# Patient Record
Sex: Female | Born: 1945 | Race: Black or African American | Hispanic: No | Smoking: Current every day smoker
Health system: Southern US, Community
[De-identification: ages and names within clinical notes are randomized; demographics above are authoritative.]

## PROBLEM LIST (undated history)

## (undated) DIAGNOSIS — R42 Dizziness and giddiness: Secondary | ICD-10-CM

## (undated) DIAGNOSIS — E119 Type 2 diabetes mellitus without complications: Secondary | ICD-10-CM

## (undated) DIAGNOSIS — K219 Gastro-esophageal reflux disease without esophagitis: Secondary | ICD-10-CM

## (undated) DIAGNOSIS — I10 Essential (primary) hypertension: Secondary | ICD-10-CM

## (undated) DIAGNOSIS — K449 Diaphragmatic hernia without obstruction or gangrene: Secondary | ICD-10-CM

## (undated) DIAGNOSIS — J449 Chronic obstructive pulmonary disease, unspecified: Secondary | ICD-10-CM

## (undated) DIAGNOSIS — E7800 Pure hypercholesterolemia, unspecified: Secondary | ICD-10-CM

## (undated) HISTORY — PX: CERVICAL SPINE SURGERY: SHX589

## (undated) HISTORY — PX: HAND SURGERY: SHX662

## (undated) HISTORY — PX: TUBAL LIGATION: SHX77

## (undated) HISTORY — DX: Diaphragmatic hernia without obstruction or gangrene: K44.9

## (undated) HISTORY — DX: Gastro-esophageal reflux disease without esophagitis: K21.9

## (undated) HISTORY — PX: BLADDER SURGERY: SHX569

## (undated) HISTORY — PX: FOOT SURGERY: SHX648

## (undated) HISTORY — PX: CHOLECYSTECTOMY, LAPAROSCOPIC: SHX56

## (undated) HISTORY — PX: BACK SURGERY: SHX140

---

## 1999-11-24 ENCOUNTER — Encounter: Payer: Self-pay | Admitting: Neurosurgery

## 1999-11-25 ENCOUNTER — Encounter: Payer: Self-pay | Admitting: Neurosurgery

## 1999-12-02 ENCOUNTER — Encounter: Payer: Self-pay | Admitting: Neurosurgery

## 1999-12-02 ENCOUNTER — Inpatient Hospital Stay (HOSPITAL_COMMUNITY): Admission: RE | Admit: 1999-12-02 | Discharge: 1999-12-04 | Payer: Self-pay | Admitting: Neurosurgery

## 2000-01-31 ENCOUNTER — Encounter: Admission: RE | Admit: 2000-01-31 | Discharge: 2000-01-31 | Payer: Self-pay | Admitting: Neurosurgery

## 2000-01-31 ENCOUNTER — Encounter: Payer: Self-pay | Admitting: Neurosurgery

## 2000-06-06 ENCOUNTER — Encounter: Payer: Self-pay | Admitting: Neurosurgery

## 2000-06-06 ENCOUNTER — Encounter: Admission: RE | Admit: 2000-06-06 | Discharge: 2000-06-06 | Payer: Self-pay | Admitting: Neurosurgery

## 2000-06-14 ENCOUNTER — Encounter: Payer: Self-pay | Admitting: Neurosurgery

## 2000-06-14 ENCOUNTER — Ambulatory Visit (HOSPITAL_COMMUNITY): Admission: RE | Admit: 2000-06-14 | Discharge: 2000-06-14 | Payer: Self-pay | Admitting: Neurosurgery

## 2001-01-08 ENCOUNTER — Encounter: Payer: Self-pay | Admitting: Neurosurgery

## 2001-01-08 ENCOUNTER — Encounter: Admission: RE | Admit: 2001-01-08 | Discharge: 2001-01-08 | Payer: Self-pay | Admitting: Neurosurgery

## 2001-01-15 ENCOUNTER — Other Ambulatory Visit: Admission: RE | Admit: 2001-01-15 | Discharge: 2001-01-15 | Payer: Self-pay | Admitting: *Deleted

## 2001-02-15 ENCOUNTER — Emergency Department (HOSPITAL_COMMUNITY): Admission: EM | Admit: 2001-02-15 | Discharge: 2001-02-15 | Payer: Self-pay | Admitting: Emergency Medicine

## 2001-02-15 ENCOUNTER — Encounter: Payer: Self-pay | Admitting: Emergency Medicine

## 2001-07-09 ENCOUNTER — Ambulatory Visit (HOSPITAL_COMMUNITY): Admission: RE | Admit: 2001-07-09 | Discharge: 2001-07-09 | Payer: Self-pay | Admitting: Pulmonary Disease

## 2001-07-11 ENCOUNTER — Ambulatory Visit (HOSPITAL_COMMUNITY): Admission: RE | Admit: 2001-07-11 | Discharge: 2001-07-11 | Payer: Self-pay | Admitting: Pulmonary Disease

## 2001-12-18 ENCOUNTER — Emergency Department (HOSPITAL_COMMUNITY): Admission: EM | Admit: 2001-12-18 | Discharge: 2001-12-18 | Payer: Self-pay | Admitting: Emergency Medicine

## 2002-12-01 ENCOUNTER — Encounter: Payer: Self-pay | Admitting: *Deleted

## 2002-12-01 ENCOUNTER — Ambulatory Visit (HOSPITAL_COMMUNITY): Admission: RE | Admit: 2002-12-01 | Discharge: 2002-12-01 | Payer: Self-pay | Admitting: *Deleted

## 2002-12-03 ENCOUNTER — Ambulatory Visit (HOSPITAL_COMMUNITY): Admission: RE | Admit: 2002-12-03 | Discharge: 2002-12-03 | Payer: Self-pay | Admitting: Pulmonary Disease

## 2002-12-04 ENCOUNTER — Ambulatory Visit (HOSPITAL_COMMUNITY): Admission: RE | Admit: 2002-12-04 | Discharge: 2002-12-04 | Payer: Self-pay | Admitting: *Deleted

## 2002-12-04 ENCOUNTER — Encounter: Payer: Self-pay | Admitting: *Deleted

## 2002-12-18 ENCOUNTER — Ambulatory Visit (HOSPITAL_COMMUNITY): Admission: RE | Admit: 2002-12-18 | Discharge: 2002-12-18 | Payer: Self-pay | Admitting: General Surgery

## 2003-01-20 ENCOUNTER — Ambulatory Visit (HOSPITAL_COMMUNITY): Admission: RE | Admit: 2003-01-20 | Discharge: 2003-01-20 | Payer: Self-pay | Admitting: Neurosurgery

## 2003-01-20 ENCOUNTER — Encounter: Payer: Self-pay | Admitting: Neurosurgery

## 2003-07-03 ENCOUNTER — Encounter: Payer: Self-pay | Admitting: Neurosurgery

## 2003-07-03 ENCOUNTER — Encounter: Payer: Self-pay | Admitting: Radiology

## 2003-07-03 ENCOUNTER — Encounter: Admission: RE | Admit: 2003-07-03 | Discharge: 2003-07-03 | Payer: Self-pay | Admitting: Neurosurgery

## 2003-07-21 ENCOUNTER — Encounter: Admission: RE | Admit: 2003-07-21 | Discharge: 2003-07-21 | Payer: Self-pay | Admitting: Neurosurgery

## 2003-11-26 ENCOUNTER — Other Ambulatory Visit: Admission: RE | Admit: 2003-11-26 | Discharge: 2003-11-26 | Payer: Self-pay | Admitting: *Deleted

## 2003-12-09 ENCOUNTER — Ambulatory Visit (HOSPITAL_COMMUNITY): Admission: RE | Admit: 2003-12-09 | Discharge: 2003-12-09 | Payer: Self-pay | Admitting: Pulmonary Disease

## 2004-02-24 ENCOUNTER — Emergency Department (HOSPITAL_COMMUNITY): Admission: EM | Admit: 2004-02-24 | Discharge: 2004-02-24 | Payer: Self-pay | Admitting: Emergency Medicine

## 2004-04-18 ENCOUNTER — Ambulatory Visit (HOSPITAL_COMMUNITY): Admission: RE | Admit: 2004-04-18 | Discharge: 2004-04-18 | Payer: Self-pay | Admitting: Pulmonary Disease

## 2004-05-23 ENCOUNTER — Encounter: Admission: RE | Admit: 2004-05-23 | Discharge: 2004-05-23 | Payer: Self-pay | Admitting: Neurosurgery

## 2004-06-10 ENCOUNTER — Encounter: Admission: RE | Admit: 2004-06-10 | Discharge: 2004-06-10 | Payer: Self-pay | Admitting: Neurosurgery

## 2004-12-12 ENCOUNTER — Ambulatory Visit (HOSPITAL_COMMUNITY): Admission: RE | Admit: 2004-12-12 | Discharge: 2004-12-12 | Payer: Self-pay | Admitting: Pulmonary Disease

## 2004-12-19 ENCOUNTER — Ambulatory Visit (HOSPITAL_COMMUNITY): Admission: RE | Admit: 2004-12-19 | Discharge: 2004-12-19 | Payer: Self-pay | Admitting: Pulmonary Disease

## 2004-12-28 ENCOUNTER — Ambulatory Visit (HOSPITAL_COMMUNITY): Admission: RE | Admit: 2004-12-28 | Discharge: 2004-12-28 | Payer: Self-pay | Admitting: Pulmonary Disease

## 2005-01-19 ENCOUNTER — Encounter: Admission: RE | Admit: 2005-01-19 | Discharge: 2005-01-19 | Payer: Self-pay | Admitting: Pulmonary Disease

## 2005-02-03 ENCOUNTER — Encounter: Admission: RE | Admit: 2005-02-03 | Discharge: 2005-02-03 | Payer: Self-pay | Admitting: Pulmonary Disease

## 2006-01-01 ENCOUNTER — Ambulatory Visit (HOSPITAL_COMMUNITY): Admission: RE | Admit: 2006-01-01 | Discharge: 2006-01-01 | Payer: Self-pay | Admitting: Pulmonary Disease

## 2006-05-02 ENCOUNTER — Ambulatory Visit: Payer: Self-pay | Admitting: Internal Medicine

## 2006-05-04 ENCOUNTER — Ambulatory Visit (HOSPITAL_COMMUNITY): Admission: RE | Admit: 2006-05-04 | Discharge: 2006-05-04 | Payer: Self-pay | Admitting: Internal Medicine

## 2006-05-04 ENCOUNTER — Ambulatory Visit: Payer: Self-pay | Admitting: Internal Medicine

## 2007-07-18 ENCOUNTER — Encounter (HOSPITAL_COMMUNITY): Admission: RE | Admit: 2007-07-18 | Discharge: 2007-08-17 | Payer: Self-pay | Admitting: Internal Medicine

## 2007-08-14 ENCOUNTER — Ambulatory Visit: Payer: Self-pay | Admitting: Cardiology

## 2008-02-05 ENCOUNTER — Ambulatory Visit (HOSPITAL_COMMUNITY): Admission: RE | Admit: 2008-02-05 | Discharge: 2008-02-05 | Payer: Self-pay | Admitting: Pulmonary Disease

## 2008-03-02 ENCOUNTER — Ambulatory Visit (HOSPITAL_COMMUNITY): Admission: RE | Admit: 2008-03-02 | Discharge: 2008-03-02 | Payer: Self-pay | Admitting: Pulmonary Disease

## 2008-07-26 ENCOUNTER — Emergency Department (HOSPITAL_COMMUNITY): Admission: EM | Admit: 2008-07-26 | Discharge: 2008-07-26 | Payer: Self-pay | Admitting: Emergency Medicine

## 2008-08-28 ENCOUNTER — Emergency Department (HOSPITAL_COMMUNITY): Admission: EM | Admit: 2008-08-28 | Discharge: 2008-08-28 | Payer: Self-pay | Admitting: Emergency Medicine

## 2008-09-10 ENCOUNTER — Emergency Department (HOSPITAL_COMMUNITY): Admission: EM | Admit: 2008-09-10 | Discharge: 2008-09-10 | Payer: Self-pay | Admitting: Emergency Medicine

## 2008-09-15 ENCOUNTER — Ambulatory Visit (HOSPITAL_COMMUNITY): Admission: RE | Admit: 2008-09-15 | Discharge: 2008-09-15 | Payer: Self-pay | Admitting: Pulmonary Disease

## 2008-10-06 ENCOUNTER — Encounter: Admission: RE | Admit: 2008-10-06 | Discharge: 2008-10-06 | Payer: Self-pay | Admitting: Neurosurgery

## 2008-10-23 ENCOUNTER — Ambulatory Visit (HOSPITAL_COMMUNITY): Admission: RE | Admit: 2008-10-23 | Discharge: 2008-10-24 | Payer: Self-pay | Admitting: Neurosurgery

## 2009-05-20 ENCOUNTER — Ambulatory Visit (HOSPITAL_COMMUNITY): Admission: RE | Admit: 2009-05-20 | Discharge: 2009-05-20 | Payer: Self-pay | Admitting: Pulmonary Disease

## 2009-08-11 ENCOUNTER — Ambulatory Visit (HOSPITAL_COMMUNITY): Admission: RE | Admit: 2009-08-11 | Discharge: 2009-08-11 | Payer: Self-pay | Admitting: Neurosurgery

## 2009-11-12 ENCOUNTER — Inpatient Hospital Stay (HOSPITAL_COMMUNITY): Admission: RE | Admit: 2009-11-12 | Discharge: 2009-11-15 | Payer: Self-pay | Admitting: Neurosurgery

## 2010-02-25 ENCOUNTER — Ambulatory Visit (HOSPITAL_COMMUNITY): Admission: RE | Admit: 2010-02-25 | Discharge: 2010-02-25 | Payer: Self-pay | Admitting: Pulmonary Disease

## 2010-04-04 ENCOUNTER — Other Ambulatory Visit: Admission: RE | Admit: 2010-04-04 | Discharge: 2010-04-04 | Payer: Self-pay | Admitting: Obstetrics & Gynecology

## 2010-04-27 ENCOUNTER — Ambulatory Visit (HOSPITAL_COMMUNITY): Admission: RE | Admit: 2010-04-27 | Discharge: 2010-04-27 | Payer: Self-pay | Admitting: Pulmonary Disease

## 2010-07-20 ENCOUNTER — Emergency Department (HOSPITAL_COMMUNITY): Admission: EM | Admit: 2010-07-20 | Discharge: 2010-07-20 | Payer: Self-pay | Admitting: Emergency Medicine

## 2010-10-05 ENCOUNTER — Ambulatory Visit (HOSPITAL_COMMUNITY)
Admission: RE | Admit: 2010-10-05 | Discharge: 2010-10-05 | Payer: Self-pay | Source: Home / Self Care | Attending: Pulmonary Disease | Admitting: Pulmonary Disease

## 2010-11-03 ENCOUNTER — Other Ambulatory Visit (HOSPITAL_COMMUNITY): Payer: Self-pay | Admitting: Pulmonary Disease

## 2010-11-03 ENCOUNTER — Ambulatory Visit (HOSPITAL_COMMUNITY)
Admission: RE | Admit: 2010-11-03 | Discharge: 2010-11-03 | Disposition: A | Discharge status: Home / Self Care | Payer: BC Managed Care – PPO | Source: Ambulatory Visit | Attending: Pulmonary Disease | Admitting: Pulmonary Disease

## 2010-11-03 DIAGNOSIS — M25519 Pain in unspecified shoulder: Secondary | ICD-10-CM | POA: Insufficient documentation

## 2010-11-03 DIAGNOSIS — M542 Cervicalgia: Secondary | ICD-10-CM

## 2010-11-03 DIAGNOSIS — M47812 Spondylosis without myelopathy or radiculopathy, cervical region: Secondary | ICD-10-CM | POA: Insufficient documentation

## 2010-11-03 DIAGNOSIS — M5090 Cervical disc disorder, unspecified, unspecified cervical region: Secondary | ICD-10-CM | POA: Insufficient documentation

## 2010-12-05 LAB — URINALYSIS, ROUTINE W REFLEX MICROSCOPIC
Nitrite: NEGATIVE
Protein, ur: NEGATIVE mg/dL
Specific Gravity, Urine: 1.008 (ref 1.005–1.030)
Urobilinogen, UA: 1 mg/dL (ref 0.0–1.0)

## 2010-12-05 LAB — BASIC METABOLIC PANEL
CO2: 27 mEq/L (ref 19–32)
Chloride: 108 mEq/L (ref 96–112)
GFR calc Af Amer: >60 mL/min (ref >60)
Potassium: 4.3 mEq/L (ref 3.5–5.1)
Sodium: 140 mEq/L (ref 135–145)

## 2010-12-05 LAB — URINE CULTURE: Culture: NO GROWTH

## 2010-12-05 LAB — URINE MICROSCOPIC-ADD ON

## 2010-12-05 LAB — CBC
HCT: 38.8 % (ref 36.0–46.0)
Hemoglobin: 13.1 g/dL (ref 12.0–15.0)
MCHC: 33.6 g/dL (ref 30.0–36.0)
MCV: 88 fL (ref 78.0–100.0)
RBC: 4.42 MIL/uL (ref 3.87–5.11)

## 2010-12-05 LAB — SURGICAL PCR SCREEN: MRSA, PCR: NEGATIVE

## 2010-12-27 LAB — BASIC METABOLIC PANEL
GFR calc Af Amer: >60 mL/min (ref >60)
GFR calc non Af Amer: >60 mL/min (ref >60)
Glucose, Bld: 95 mg/dL (ref 70–99)
Potassium: 4.2 mEq/L (ref 3.5–5.1)
Sodium: 138 mEq/L (ref 135–145)

## 2010-12-27 LAB — CBC
HCT: 38.5 % (ref 36.0–46.0)
Hemoglobin: 13.1 g/dL (ref 12.0–15.0)
RBC: 4.39 MIL/uL (ref 3.87–5.11)
RDW: 13.6 % (ref 11.5–15.5)

## 2011-01-24 NOTE — Op Note (Signed)
NAME:  Rebekah, Gutierrez NO.:  [id number]   MEDICAL RECORD NO.:  [id number]          PATIENT TYPE:  OIB   LOCATION:  3534                         FACILITY:  [facility]   PHYSICIAN:  Coletta Memos, M.D.     DATE OF BIRTH:  [birth year]   DATE OF PROCEDURE:  10/23/2008  DATE OF DISCHARGE:                               OPERATIVE REPORT   PREOPERATIVE DIAGNOSES:  1. Left lower extremity pain.  2. Left L5, left L4 radiculopathies.   POSTOPERATIVE DIAGNOSES:  1. Displaced disk, left L4-5.  2. L4-L5 radiculopathies.   COMPLICATIONS:  None.   SURGEON:  Coletta Memos, MD   ASSISTANT:  Hewitt Shorts, MD   PROCEDURES:  Left L4-5 semihemilaminectomy and diskectomy with  microdissection.   INDICATIONS:  [name] presented with significant pain in her left  lower extremity.  I have followed her for a number of years and the pain  has just increased significantly.   OPERATIVE NOTE:  Ms. Dust was brought to the operating room, intubated,  placed under general anesthetic without difficulty.  She was rolled  prone onto the Wilson frame and all pressure points were properly  padded.  Her back was prepped and she was draped in a sterile fashion.  I opened the skin incision in the lumbar region and then exposed the  lamina of L4 and L5.  An intraoperative x-ray confirmed my location.  I  placed a self-retaining retractor and using a high-speed electric drill  performed a semihemilaminectomy of L4 and L5.  With Dr. Earl Gala  assistance and microscopic dissection, we removed the ligamentum flavum  and exposed the thecal sac.  I had initially attempted to do an  extraforaminal approach, but secondary to the facet size, I did not feel  that that was warranted.  I did drill out laterally and gained entrance  to what I believe was a canal, but no further bone was removed and the  pars was left intact.   With the microscope in position, we were able to remove what was  significantly thickened ligament and arthropathic facet along the  lateral gutters of the spinal canal at L4-5.  Dr. Newell Coral and I were  able to identify the disk space.  I opened that with a #15 blade.  Then  in a progressive fashion using both hooks, curettes, Kerrison punches,  and other dissectors, we removed a great deal of disk material,  endplate, and free fragments which were underlying the left L4  root.  Thorough decompression was obtained.  I then irrigated the wound.  I then closed the wound in a layered fashion using Vicryl sutures to  reapproximate the thoracolumbar subcutaneous and subcuticular layers.  I  used Dermabond for sterile dressing.           ______________________________  Coletta Memos, M.D.     KC/MEDQ  D:  10/23/2008  T:  10/24/2008  Job:  [id number]

## 2011-01-27 NOTE — H&P (Signed)
[place]  Patient:    Rebekah Gutierrez, Rebekah Gutierrez                           MRN: [id number] Adm. Date:  [id number] Attending:  Coletta Memos                         History and Physical  ADMISSION DIAGNOSES: 1. Cervical spondylosis, C4-5, C5-6. 2. 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 3. Cervical radiculopathy, C5, C6 and C7.  INDICATIONS:  Rebekah Gutierrez is a 65 year old black woman who presented to my office with a history of pain in the neck, shoulders and arms since November of 2000. She has had problems with her neck which go back a few years but the pain is now becoming more severe.  The arm pain and shoulder pain are something which have gotten worse over time and much worse in the last month.  Rebekah Gutierrez has missed about one day of work as a result of the pain.  She just deals with it, as she says. She has tried physical therapy before and it has not been helpful.  She does not have weakness nor bowel or bladder dysfunction.  She has had unfortunately no relief of the pain.  She did take ibuprofen for a short period of time and said that it did help.  She describes her hand as getting stiff at times and objects falling out of her hands.  She has muscle spasms in her shoulders which she has had for a few years.  She has pain in her arms, which is greater in the left than the right.   REVIEW OF SYSTEMS:  Positive for eye glasses and dryness in both eyes.  She has  hypertension, problems with indigestion, back pain, arm pain, leg pain and neck  pain, blurred vision and excessive thirst.  She has had a bladder tackup in the  past.  She says that her throat stays dry most of the time and she keeps a piece of candy in her mouth for that reason.  MEDICATIONS: 1. Captoprel/HCTZ 25/12 mg. 2. Prempro one a day. 3. Sonata.  PAST MEDICAL HISTORY:  Significant for hypertension.  ALLERGIES:  She had no known drug allergies.  SOCIAL HISTORY:  She smokes  one-half pack of cigarettes a day, since age 84. She does not use alcohol and does not have a history of illicit drug use.  She is 5-feet 1-inch tall, weighing 153 pounds.  FAMILY HISTORY:  Positive for diabetes and hypertension, along with heart disease. Mother is age 5 with hypertension and diabetes.  PHYSICAL EXAMINATION:  VITAL SIGNS:  Pulse today on exam was 80.  NEUROLOGIC:  She is alert and oriented x 4 and answers all questions appropriately. Pupils are equal, round and reactive to light.  She has full extraocular movements. Funduscopic examination is normal.  Memory, language and attention span are normal. Rest of the cranial nerves V through XII are normal.  She has 5/5 strength in the upper extremities and no drift.  Muscle tone, bulk and coordination are normal.  Proprioception is intact throughout.  Pinprick is slightly diminished in the C5  distribution bilaterally.  She has normal coordination and a normal gait.  She as a negative Romberg test and can tandem walk without difficulty.  She can toe walk, heel walk and do deep knee bends without difficulty.  Deep tendon reflexes are +  at the biceps, triceps, brachioradialis, knees and ankles.  No clonus is elicited. Minimal Hoffmanns sign on the right; none on the left.  LUNGS:  She had clear lung fields.  NECK:  No cervical masses or bruits are appreciated.  HEART:  Regular rhythm and rate.  No murmurs or rubs.  Pulses are good to wrists and feet bilaterally.  IMAGING STUDY:  MRI done on July 27, 1999 shows large spondylitic bars at C4-5, causing cervical and foraminal stenosis, the same at C5-6, and a large soft disc at C6-7 on the right side, distorting the spinal cord and compressing the right C7  nerve root.  No abnormalities are identified in her spinal cord.  She has good alignment.  No abnormal masses are appreciated.  DIAGNOSES: 1. Cervical spondylosis without myelopathy. 2. Cervical  radiculopathies bilaterally. 3. Cervical stenosis. 4. Cervical displaced disc.  Rebekah. Gutierrez is a 65 year old woman with neck pain secondary to cervical stenosis nd foraminal stenosis.  I believe she needs decompression of C4-5, 5-6 and possibly C6-7.  We spoke about physical therapy but since she has tried it to no avail, he would like to undergo operative procedure to relieve her of the pain.  She is scheduled for anterior cervical diskectomy, foraminotomy and arthrodesis at C4-5, 5-6 and possibly 6-7 with allograft and plating.  Risks of the procedure including bleeding, infection, no pain relief, worsening of pain, bowel or bladder dysfunction, fusion failure and hardware failure were discussed.  She understands and wishes to proceed.DD:  12/02/99 TD:  12/02/99 Job: [id number]

## 2011-01-27 NOTE — Discharge Summary (Signed)
. [facility]  Patient:    Rebekah Gutierrez, Rebekah Gutierrez                           MRN: [id number] Adm. Date:  [id number] Disc. Date: [id number] Attending:  Coletta Memos                           Discharge Summary  ADMISSION DIAGNOSIS:  Cervical spondylosis and herniated cervical disks with cervical radiculopathy  ADDITIONAL DIAGNOSIS:  Hypertension.  FINAL DIAGNOSES: 1. Cervical spondylosis and herniated cervical disks with cervical    radiculopathy. 2. Hypertension.  HISTORY OF ILLNESS AND HOSPITAL COURSE:  Rebekah Gutierrez is a 65 year old woman with cervical spondylosis and herniated cervical disks with cervical radiculopathy.  She was admitted to the hospital on same-day-as-procedure basis, and underwent anterior cervical diskectomy and fusion at C4-5, C5-6, and C6-7 levels with allograft bone grafting and anterior cervical plating. Postoperatively, she had significant relief in her radicular symptoms and only mild neck discomfort.  She had full strength in her deltoids, biceps, triceps, hand intrinsics bilaterally.  She did not have any complaints of dysphagia, and was doing well on March 24 but slow to mobilize, and still requiring IV pain analgesia.  On March 25, she was doing well, had full strength, no significant complaints, and was tolerating oral analgesics well.  She was discharged to home on the morning of March 25 in stable and satisfactory condition, having tolerated her operation and hospitalization well.  DISCHARGE MEDICATIONS: 1. Hydrocodone 5/500 1-2 q.4h. as needed for pain.  She restarted her home    medications of: 2. Hydrochlorothiazide 12.5 mg daily. 3. Capoten 25 mg daily. 4. K-Dur 20 mEq daily. 5. Provera 2.5 mg daily. 6. Premarin 0.625 mg daily.  ACTIVITY:  Instructions of no lifting, bending, twisting, or driving.  WOUND CARE:  Okay to shower but not to soak her incision.  FOLLOW-UP:  She is to follow up with Dr. Franky Macho in two  weeks. DD:  12/04/99 TD:  12/05/99 Job: [id number]

## 2011-01-27 NOTE — Op Note (Signed)
[place]  Patient:    Rebekah Gutierrez, Rebekah Gutierrez                           MRN: [id number] Proc. Date: 12/02/99 Adm. Date:  [id number] Disc. Date: [id number] Attending:  Coletta Memos Dictator:   Mena Goes. Franky Macho, M.D.                           Operative Report  PREOPERATIVE DIAGNOSES: 1. Cervical spondylosis at C4-5 and C5-6. 2. Distal cervical displaced disc, right side, C6-7. 3. Cervical stenosis, C4-5 and C5-6.  POSTOPERATIVE DIAGNOSES: 1. Cervical spondylosis at C4-5 and C5-6. 2. Distal cervical displaced disc, right side, C6-7. 3. Cervical stenosis, C4-5 and C5-6.  PROCEDURE: 1. Anterior cervical diskectomies, C4-5, C5-6, and C6-7. 2. Arthrodesis using allograf 6 mm allograf at C4-5, 7 mm allograf at C5-6, 6 mm    allograf at C6-7. 3. Anterior plating, 54 mm small stature Synthes plate.  COMPLICATIONS:  None.  SURGEON:  Dr. Franky Macho.  ASSISTANT:  Alanson Aly. Roxan Hockey, M.D.  ANESTHESIA:  General endotracheal anesthesia.  INDICATIONS:  Rebekah Gutierrez is a 65 year old woman who presented with a long history of pain bilaterally in the shoulder and both upper extremities, primarily in the left side.  MRI showed significant foraminal stenosis and spondylitic bars at C4-5 and C5-6, and also a large displaced disc at C6-7 on the right side distorting he cord and obviously compressing the right C7 nerve root.  I recommended, and she  agreed, to undergo an anterior cervical diskectomy and arthrodesis with anterior plating.  DESCRIPTION OF PROCEDURE:  The patient was brought to the operating room, intubated, and placed under general anesthesia without difficulty.  She had a Foley catheter placed under sterile conditions.  Her head was placed in essentially a  neutral position on a cerebellar head rest, and she had ten pounds of cervical traction applied via a chin strap.  Her neck was prepped and I infiltrated prior to draping with 0.50% lidocaine  1:200,000 strength epinephrine in the line along the medial border of the left sternocleidomastoid longitudinally.  I draped the patient in a sterile fashion.  I opened the patient with a #10 blade, making an incision along the medial border of the sternocleidomastoid.  I took the 10 blade and went down through the subcutaneous tissue.  I then used Metzenbaum scissors to dissect to the level of the platysma.  I opened up the platysma sharply with the Metzenbaum scissors.  I then with sharp and blunt dissection I was able to identify Ommaya, sternocleidomastoid, and the carotid sheath.  After identifying those structures I placed hand-held retractors on either side of the anterior cervical spine retracting the longus coli muscles.  I placed a needle to the C5-6 on intraoperative x-ray.  Then reflected the longus coli muscles laterally from C4  through C7.  I placed self-retaining Cloward retractors underneath the belly of the longus coli.  I opened first the C5-6 disc space with a #15 blade. I scraped down the end plates with curettes.  I then placed two distraction pins, one at C5, one at C6.  I distracted the disc space, brought the microscope into position, completed the diskectomy using the Midas Rex drill and curettes.  I then drilled in a meticulous fashion a large osteophyte spanning the C5-6 disc space.  After thinning this out sufficiently I was then able to remove  the posterior longitudinal ligament.  Then I was able to remove further more bone along the superior portion of C6, inferior portion of C5 posteriorly.  The dura was identified.  I was able to go out over both C6 nerve roots and make sure that there was no pressure on them. I then placed a 7 mm graft into the disc space.  I removed the Cloward retractor and moved them up one space at C4-5.  I opened the C4-5 disc space with a 15 blade. Proceeded with a diskectomy using curettes and the Midas Rex drill.   Again, there was a large osteophyte spanning the interspace to disc space posteriorly.  I used the Midas Rex drill to thin the bone until I was able to place a small nerve hook underneath and lift the bone up.  The posterior longitudinal ligament was exposed, and I was able to remove that with Kerrison punch.  The dura was now exposed, and again I decompressed the C5 nerve roots both on the right and left sides. After that was done I placed a 6 mm graft into the disc space after drilling down the end plates with the Midas Rex drill at C4-5.  I again removed the Cloward retractor  blades, and then moved the blades down to the C6-7 disc space.  I opened the disc space with a #10 blade.  Dr. Roxan Hockey was assisting.  With a long hem we removed the disc at C6-7, and Dr. Roxan Hockey had also performed the same duties at C4-5.  After removing the disc I was able to appreciate a large disc herniation on the  right side of C6-7.  Removed this disc material with pituitary rongeurs and using the nerve root curette.  I encountered a significant amount of bleeding there, o I packed that off with Gelfoam.  Dr. Roxan Hockey then prepared the left side using a  Kerrison punch and enlarging the neuroforamin.  I removed my packing from bleeding. This was controlled off the left side with gelfoam.  I removed all of that material.  I placed the graft 6 mm at the C6-7 disc space.  I then was able to irrigate the wound.  I placed a 54 mm small stature Synthes plate.  I used two screws at C4, I used two screws at C5, one screw at C6, and two screws at C7. he left screw at C6 would not engage completely, and since I had center screws in position I did not feel that it was crucial.  I packed the hole with some gelfoam. I irrigated the wound again.  I put in the lock-in screws and the Synthes small  stature plate.  I took another x-ray which showed the plate and the bone plugs ere in good position.  I  irrigated once more.  I then closed the wound in layered fashion using Vicryl sutures.  The skin was reapproximated with Steri-Strips. DD:  12/02/99 TD:  12/05/99 Job: [id number]

## 2011-01-27 NOTE — H&P (Signed)
   NAME:  Rebekah Gutierrez                           ACCOUNT NO.:  [id number]   MEDICAL RECORD NO.:  [id number]                   PATIENT TYPE:  AMB   LOCATION:                                       FACILITY:  [facility]   PHYSICIAN:  Dalia Heading, M.D.               DATE OF BIRTH:  Jun 01, 1946   DATE OF ADMISSION:  12/18/2002  DATE OF DISCHARGE:                                HISTORY & PHYSICAL   CHIEF COMPLAINT:  Hematochezia.   HISTORY OF PRESENT ILLNESS:  The patient is a 65 year old black female who  is referred for a colonoscopy.  She has been having intermittent  hematochezia.  She denies any lightheadedness, weight loss, fever, abdominal  pain, constipation, diarrhea, or melena.  She denies any hemorrhoidal  problems. She does have a history of rectal prolapse. She has never had a  colonoscopy.   FAMILY HISTORY:  There is no immediate family history of colon carcinoma.   PAST MEDICAL HISTORY:  Includes hypertension.   PAST SURGICAL HISTORY:  1. Throat surgery.  2. Left hand surgery.  3. Laparoscopic cholecystectomy.   CURRENT MEDICATIONS:  1. Potassium supplements.  2. Tarka.  3. Nexium.   ALLERGIES:  No known drug allergies.   SOCIAL HISTORY:  The patient does smoke 1/2 pack of cigarettes a day.  She  denies any significant alcohol use.   PHYSICAL EXAMINATION:  GENERAL:  On physical examination, the patient is a  well-developed well-nourished black female in no acute distress.  VITAL SIGNS:  She is afebrile and vital signs are stable.  LUNGS:  Clear to auscultation with equal breath sounds bilaterally.  HEART:  Heart examination reveals a regular rate and rhythm without S3, S4,  or murmurs.  ABDOMEN:  The abdomen is soft, nontender, nondistended.  No  hepatosplenomegaly, masses, or hernia are identified.  RECTAL:  Examination was deferred to the procedure.   IMPRESSION:  Hematochezia .    PLAN:  The patient is scheduled for a colonoscopy on 12/18/2002.  The  risks  and benefits of the procedure including bleeding and perforation were fully  explained to the patient, who gave informed consent.                                               Dalia Heading, M.D.    MAJ/MEDQ  D:  12/11/2002  T:  12/11/2002  Job:  [id number]   cc:   Langley Gauss, M.D.  [address]  [place]  [place] [address]  Fax: [phone]   Oneal Deputy. Juanetta Gosling, M.D.  [address]  [place]  [place] [address]  Fax: [phone]

## 2011-01-27 NOTE — Consult Note (Signed)
NAME:  Rebekah Gutierrez, Rebekah Gutierrez NO.:  [id number]   MEDICAL RECORD NO.:  [id number]          PATIENT TYPE:  AMB   LOCATION:                                FACILITY:  [facility]   PHYSICIAN:  Lionel December, M.D.    DATE OF BIRTH:  March 03, 1946   DATE OF CONSULTATION:  05/02/2006  DATE OF DISCHARGE:                                   CONSULTATION   GASTROENTEROLOGY CONSULTATION:   REQUESTING PHYSICIAN:  Dr. Jearld Fenton   REASON FOR CONSULTATION:  Chronic GERD with LPR.   HISTORY OF PRESENT ILLNESS:  Rebekah Gutierrez is a 65 year old African-American  female who is referred through the courtesy of Dr. Jearld Fenton.  She tells me she  has an at least 10-year history of chronic GERD.  She has been on Nexium for  at least the last 6 years, has been on b.i.d. dosing for the last 3-4 years  she tells me, she occasionally has heartburn, indigestion and breakthrough  symptoms usually worse in the evenings.  She has a history of daily  heartburn and indigestion which is much better on b.i.d. Nexium but does  continue to complain of lots of bloating, increased flatus and belching.  She also complains of chronic hoarseness.  She denies any abdominal pain,  does have rare intermittent dysphagia to both solids and liquids, denies any  odynophagia, denies any anorexia or early satiety.  She has been seen by Dr.  Jearld Fenton.  She had a benign right vocal cord nodule.   PAST MEDICAL HISTORY:  Hypertension and reflux.  She had a colonoscopy by  Dr. Lovell Sheehan a year ago which was normal.  She had a left foot bunionectomy,  left carpal tunnel release, back surgery and cholecystectomy in 1998.   CURRENT MEDICATIONS:  1. Calcium 600 mg b.i.d.  2. Klor-Con 20 mEq b.i.d.  3. Amlodipine/Benicar 10/20 mg daily.  4. Nexium 40 mg b.i.d.   ALLERGIES:  NO KNOWN DRUG ALLERGIES.   FAMILY HISTORY:  No known family history of colorectal carcinoma, liver or  chronic GI problems.  Mother deceased age 52 secondary to CVA.  Father  alive  at age 61, has history of lung disease.  She has three healthy siblings.   SOCIAL HISTORY:  Rebekah Gutierrez is divorced.  She lives with two of her sons, has  a total of four grown healthy children.  She is employed with [employer].  She  has a 42 pack-year history of tobacco use, denies any alcohol or drug use.   REVIEW OF SYSTEMS:  CONSTITUTIONAL:  She is complaining of some fatigue,  denies any fever or chills.  CARDIOVASCULAR:  Denies any chest pain or  palpitations.  PULMONARY:  Denies shortness of breath, dyspnea, cough,  hemoptysis.  GI:  See HPI.  Denies any rectal bleeding or melena.  Denies  any problems with constipation or diarrhea.   PHYSICAL EXAMINATION:  VITAL SIGNS:  Weight 153 pounds, height 60 inches,  temperature 98.7, blood pressure 132/82 and pulse 84.  GENERAL:  Rebekah Gutierrez is a 65 year old African-American female who is alert,  oriented, pleasant and cooperative in no acute distress.  HEENT:  Sclerae clear.  Nonicteric.  Conjunctivae pink.  Oropharynx pink and  moist without any lesions.  NECK:  Supple without any mass or thyromegaly.  CHEST:  Heart regular rate and rhythm, normal S1 and S2, without murmurs,  clicks, rubs, or gallops.  LUNGS:  Clear to auscultation bilaterally.  ABDOMEN:  Positive bowel sounds x4.  No mass palpated, soft.  She does have  mild tenderness to epigastrium on deep palpation.  There is no rebound  tenderness or guarding.  No hepatosplenomegaly or mass.  EXTREMITIES:  Without clubbing or edema bilaterally.   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  She does have occasional refractory symptoms  usually at bedtime, despite b.i.d. proton pump inhibitor dosing.  Her last  EGD was performed January 1998 by Dr. Karilyn Cota and she was found to have  gastritis.  She was CLOtest negative but Helicobacter pylori positive and  status post treatment in 1998.  She also has intermittent dysphagia to  both  solids and liquids and is going to require further evaluation with EGD by  Dr. Karilyn Cota in the near future.   PLAN:  1. Continue Nexium 40 mg b.i.d.  2. EGD with Dr. Karilyn Cota soon with possible ED.  I have discussed this      procedure including risks and benefits which include, but are not      limited to, bleeding, infection, perforation, drug reaction.  She      agrees with plan.  Consent will be obtained.   We would like to thank Dr. Jearld Fenton for allowing [place] to participate in the care  of Rebekah Gutierrez.      Nicholas Lose, N.P.      Lionel December, M.D.  Electronically Signed    KC/MEDQ  D:  05/02/2006  T:  05/02/2006  Job:  [id number]

## 2011-01-27 NOTE — Op Note (Signed)
NAME:  Rebekah Gutierrez, NG NO.:  [id number]   MEDICAL RECORD NO.:  [id number]          PATIENT TYPE:  AMB   LOCATION:  DAY                           FACILITY:  [facility]   PHYSICIAN:  Lionel December, M.D.    DATE OF BIRTH:  10/08/1945   DATE OF PROCEDURE:  05/04/2006  DATE OF DISCHARGE:                                 OPERATIVE REPORT   PROCEDURE:  Esophagogastroduodenoscopy with esophageal dilation.   INDICATIONS:  Mashelle is a 65 year old Afro-American female with chronic GERD  who has had symptoms for least 10 years, last EGD was 9 years ago, who  presents with intermittent solid-food dysphagia, and she is still having  breakthrough symptoms despite being on double-dose PPI.  She was recently  evaluated by Dr. Jearld Fenton, and she has benign nodule over right vocal cord.   Procedure and risks were reviewed with the patient, and informed consent was  obtained.   MEDICATIONS FOR CONSCIOUS SEDATION:  Benzocaine spray for pharyngeal topical  anesthesia, Demerol 25 mg IV, Versed 5 mg IV.   FINDINGS:  Procedure performed in endoscopy suite.  The patient's vital  signs and O2 saturation were monitored during the procedure and remained  stable.  The patient was placed in left lateral position.  Olympus  videoscope was passed via oropharynx without any difficulty into esophagus.   Esophagus.  Mucosa of the esophagus was normal throughout.  GE junction was  wavy, located at 35 cm, and hiatus was 37.  She a small sliding hiatal  hernia.  No ring or stricture was noted.  The proximal segment of the  esophagus was also carefully examined on the way out and no mucosal  abnormalities noted.   Stomach.  It was empty and distended very well with insufflation.  Folds of  proximal stomach were normal.  Examination of mucosa at body, antrum,  pyloric channel as well as angularis, fundus and cardia was normal.   Duodenum.  Bulbar mucosa was normal.  Scope was passed into second part of  the  duodenum where mucosa and folds were normal.  Endoscope was withdrawn.   Esophagus was dilated by passing 54-French Maloney dilator to full  insertion.  As the dilator was withdrawn, endoscope was passed again and  esophageal mucosa reexamined.  There was a small linear tear in the proximal  esophagus just below UES possibly indicative of web.  No mucosal disruption  was noted distally.  Pictures taken for the record.  Endoscope was  withdrawn.  The patient tolerated the procedure well.   FINAL DIAGNOSIS:  Small sliding hiatal hernia, otherwise normal exam.  No  evidence of Barrett's esophagus.   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   RECOMMENDATIONS:  Anti-reflux measures reinforced.  She will continue Nexium  at 40 mg p.o. b.i.d.   She can use OTC Pepcid 20 mg or Zantac 150 mg in the evening for  breakthrough symptoms.   She will keep symptom diary for the next 8 weeks  until her office visit.      Lionel December, M.D.  Electronically Signed     NR/MEDQ  D:  05/04/2006  T:  05/04/2006  Job:  [id number]   cc:   Suzanna Obey, M.D.  Fax: [phone]   Oneal Deputy. Juanetta Gosling, M.D.  Fax: [phone]

## 2011-06-13 LAB — COMPREHENSIVE METABOLIC PANEL
ALT: 20
AST: 19
Albumin: 3.7
Alkaline Phosphatase: 56
Chloride: 109
GFR calc Af Amer: >60
Potassium: 3.5
Total Bilirubin: 1

## 2011-06-13 LAB — URINE CULTURE: Culture: NO GROWTH

## 2011-06-13 LAB — URINALYSIS, ROUTINE W REFLEX MICROSCOPIC
Bilirubin Urine: NEGATIVE
Protein, ur: NEGATIVE
Urobilinogen, UA: 0.2

## 2011-06-13 LAB — CBC
Hemoglobin: 12.6
Platelets: 220
RBC: 4.25

## 2011-06-13 LAB — URINE MICROSCOPIC-ADD ON

## 2011-06-15 LAB — DIFFERENTIAL
Basophils Absolute: 0 10*3/uL (ref 0.0–0.1)
Basophils Relative: 1 % (ref 0–1)
Eosinophils Relative: 0 % (ref 0–5)
Eosinophils Relative: 0 % (ref 0–5)
Lymphocytes Relative: 30 % (ref 12–46)
Lymphocytes Relative: 33 % (ref 12–46)
Lymphs Abs: 2.2 10*3/uL (ref 0.7–4.0)
Monocytes Absolute: 0.1 10*3/uL (ref 0.1–1.0)
Monocytes Absolute: 0.2 10*3/uL (ref 0.1–1.0)
Monocytes Relative: 1 % — ABNORMAL LOW (ref 3–12)
Monocytes Relative: 2 % — ABNORMAL LOW (ref 3–12)
Neutro Abs: 4.9 10*3/uL (ref 1.7–7.7)
Neutro Abs: 5.3 10*3/uL (ref 1.7–7.7)

## 2011-06-15 LAB — POCT CARDIAC MARKERS

## 2011-06-15 LAB — CBC
HCT: 38.8 % (ref 36.0–46.0)
HCT: 41.5 % (ref 36.0–46.0)
Hemoglobin: 12.7 g/dL (ref 12.0–15.0)
Hemoglobin: 13.7 g/dL (ref 12.0–15.0)
MCHC: 32.9 g/dL (ref 30.0–36.0)
MCHC: 33 g/dL (ref 30.0–36.0)
MCV: 89 fL (ref 78.0–100.0)
Platelets: 213 10*3/uL (ref 150–400)
RBC: 4.36 MIL/uL (ref 3.87–5.11)
RBC: 4.67 MIL/uL (ref 3.87–5.11)
RDW: 13.9 % (ref 11.5–15.5)
WBC: 7.2 10*3/uL (ref 4.0–10.5)

## 2011-06-15 LAB — URINALYSIS, ROUTINE W REFLEX MICROSCOPIC
Bilirubin Urine: NEGATIVE
Specific Gravity, Urine: 1.015 (ref 1.005–1.030)
pH: 6.5 (ref 5.0–8.0)

## 2011-06-15 LAB — BASIC METABOLIC PANEL
BUN: 7 mg/dL (ref 6–23)
CO2: 24 mEq/L (ref 19–32)
Calcium: 9.7 mg/dL (ref 8.4–10.5)
Chloride: 110 mEq/L (ref 96–112)
Creatinine, Ser: 0.73 mg/dL (ref 0.4–1.2)
GFR calc Af Amer: >60 mL/min (ref >60)
Potassium: 3.2 mEq/L — ABNORMAL LOW (ref 3.5–5.1)
Sodium: 139 mEq/L (ref 135–145)

## 2011-06-15 LAB — URINE MICROSCOPIC-ADD ON

## 2011-06-24 ENCOUNTER — Emergency Department (HOSPITAL_COMMUNITY)
Admission: EM | Admit: 2011-06-24 | Discharge: 2011-06-24 | Disposition: A | Discharge status: Home / Self Care | Payer: BC Managed Care – PPO | Attending: Emergency Medicine | Admitting: Emergency Medicine

## 2011-06-24 ENCOUNTER — Encounter: Payer: Self-pay | Admitting: *Deleted

## 2011-06-24 DIAGNOSIS — H6590 Unspecified nonsuppurative otitis media, unspecified ear: Secondary | ICD-10-CM

## 2011-06-24 DIAGNOSIS — I10 Essential (primary) hypertension: Secondary | ICD-10-CM | POA: Insufficient documentation

## 2011-06-24 DIAGNOSIS — F17200 Nicotine dependence, unspecified, uncomplicated: Secondary | ICD-10-CM | POA: Insufficient documentation

## 2011-06-24 DIAGNOSIS — R42 Dizziness and giddiness: Secondary | ICD-10-CM

## 2011-06-24 HISTORY — DX: Essential (primary) hypertension: I10

## 2011-06-24 MED ORDER — DIAZEPAM 5 MG PO TABS: 5.0000 mg | ORAL_TABLET | Freq: Two times a day (BID) | ORAL | Status: AC

## 2011-06-24 MED ORDER — DIAZEPAM 5 MG PO TABS
5.0000 mg | ORAL_TABLET | Freq: Once | ORAL | Status: AC
  Administered 2011-06-24: 5 mg via ORAL
  Filled 2011-06-24: qty 1

## 2011-06-24 MED ORDER — FLUTICASONE PROPIONATE 50 MCG/ACT NA SUSP
1.0000 | Freq: Every day | NASAL | Status: DC
  Administered 2011-06-24: 1 via NASAL
  Filled 2011-06-24: qty 16

## 2011-06-24 MED ORDER — MECLIZINE HCL 12.5 MG PO TABS
25.0000 mg | ORAL_TABLET | Freq: Once | ORAL | Status: AC
  Administered 2011-06-24: 25 mg via ORAL
  Filled 2011-06-24: qty 2

## 2011-06-24 MED ORDER — MECLIZINE HCL 50 MG PO TABS: 25.0000 mg | ORAL_TABLET | Freq: Three times a day (TID) | ORAL | Status: AC | PRN

## 2011-06-24 NOTE — ED Notes (Signed)
Pt states that she had a pain in her right ear this am that lasted a minute and then she got dizzy. States that she is still dizzy and off balance.

## 2011-06-24 NOTE — ED Provider Notes (Signed)
History  Scribed for Dr. Jeraldine Loots, the patient was seen in room [facility]08. The chart was scribed by Gilman Schmidt. The patients care was started at 15:12. CSN: [id number] Arrival date & time: 06/24/2011  2:03 PM  Chief Complaint  Patient presents with  . Dizziness   HPI Rebekah Gutierrez is a 65 y.o. female with a history of HTN and Vertigo who presents to the Emergency Department complaining of dizziness. Pt reports being at work ~7 hoursPTA, when she developed a sharp pain in ear and then became dizzy and off balance. Symptoms are alleviated by laying down and are exacerbated by moving head. Pt additionally notes chronic abdominal pain and chronic back pain. Denies any hearing loss, leg swelling, constipation, dysuria, difficulty urinating, headaches, or confusion.  States that symptoms feel similar to Vertigo, but cannot remember taking medicine for symptoms in the past. There are no other associated symptoms and no other alleviating or aggravating factors.    Past Medical History  Diagnosis Date  . Hypertension     Past Surgical History  Procedure Date  . Back surgery   . Cervical spine surgery     fusion  . Bladder surgery   . Foot surgery     left   . Hand surgery     left    History reviewed. No pertinent family history.  History  Substance Use Topics  . Smoking status: Current Everyday Smoker -- 0.5 packs/day  . Smokeless tobacco: Not on file  . Alcohol Use: No    OB History    Grav Para Term Preterm Abortions TAB SAB Ect Mult Living                  Review of Systems  HENT: Negative for hearing loss.   Cardiovascular: Negative for leg swelling.  Gastrointestinal: Negative for vomiting and constipation.  Genitourinary: Negative for dysuria, decreased urine volume and difficulty urinating.  Neurological: Negative for headaches.  Psychiatric/Behavioral: Negative for confusion.  All other systems reviewed and are negative.    Allergies  Review of patient's allergies  indicates no known allergies.  Home Medications  No current outpatient prescriptions on file.  BP 139/88  Pulse 86  Temp(Src) 98.3 F (36.8 C) (Oral)  Resp 20  Ht 5' (1.524 m)  Wt 153 lb (69.4 kg)  BMI 29.88 kg/m2  SpO2 97%  Physical Exam  Constitutional: She is oriented to person, place, and time. She appears well-developed and well-nourished.  Non-toxic appearance. She does not have a sickly appearance.  HENT:  Head: Normocephalic and atraumatic.  Right Ear: A middle ear effusion is present.  Left Ear:  No middle ear effusion.  Eyes: Conjunctivae, EOM and lids are normal. Pupils are equal, round, and reactive to light. No scleral icterus.  Neck: Trachea normal and normal range of motion. Neck supple.  Cardiovascular: Normal rate, regular rhythm and normal heart sounds.   No murmur heard. Pulmonary/Chest: Effort normal and breath sounds normal.  Abdominal: Soft. Normal appearance and bowel sounds are normal. There is no tenderness. There is no rebound, no guarding and no CVA tenderness.  Musculoskeletal: Normal range of motion.  Neurological: She is alert and oriented to person, place, and time. She has normal strength.  Skin: Skin is warm, dry and intact.    ED Course  Procedures   DIAGNOSTIC STUDIES: Oxygen Saturation is 97% on room air, normal by my interpretation.    COORDINATION OF CARE: 15:12 - Patient evaluated by ED physician, Antivert, Flonase,  and Valium ordered  Labs Reviewed - No data to display No results found.   No diagnosis found.    MDM  I personally performed the services described in this documentation, which was scribed in my presence. The recorded information has been reviewed and considered.  This 65 year old female presents following a relatively sudden onset of dizziness earlier today. She has a history of vertigo, and notes that today's symptoms are very similar, or "the same" as those she's experienced previously. On EGD exam the patient  is virtually symptomatically, and is noted to have a mild right ear effusion. Absent focal neurologic complaints currently, or any neurologic findings, with the patient in no distress with unremarkable vital signs, her presentation is most consistent with positional vertigo, possibly caused at least in part by her right ear effusion. The patient noted an improvement in her symptoms following provision of meclizine and Valium. She will also receive Flonase. She was advised to follow up with her primary care physician in 2 days, and to return for any concerning changes in her condition.         Gerhard Munch, MD 06/24/11 [phone]

## 2011-11-27 ENCOUNTER — Telehealth: Payer: Self-pay | Admitting: Family Medicine

## 2011-11-27 ENCOUNTER — Other Ambulatory Visit: Payer: Self-pay | Admitting: Family Medicine

## 2011-11-27 ENCOUNTER — Ambulatory Visit (INDEPENDENT_AMBULATORY_CARE_PROVIDER_SITE_OTHER): Payer: BC Managed Care – PPO | Admitting: Family Medicine

## 2011-11-27 ENCOUNTER — Encounter: Payer: Self-pay | Admitting: Family Medicine

## 2011-11-27 VITALS — BP 130/82 | HR 84 | Resp 16 | Ht 60.0 in | Wt 156.1 lb

## 2011-11-27 DIAGNOSIS — F17200 Nicotine dependence, unspecified, uncomplicated: Secondary | ICD-10-CM

## 2011-11-27 DIAGNOSIS — Z139 Encounter for screening, unspecified: Secondary | ICD-10-CM

## 2011-11-27 DIAGNOSIS — G4700 Insomnia, unspecified: Secondary | ICD-10-CM

## 2011-11-27 DIAGNOSIS — R922 Inconclusive mammogram: Secondary | ICD-10-CM

## 2011-11-27 DIAGNOSIS — Z1211 Encounter for screening for malignant neoplasm of colon: Secondary | ICD-10-CM

## 2011-11-27 DIAGNOSIS — E669 Obesity, unspecified: Secondary | ICD-10-CM

## 2011-11-27 DIAGNOSIS — Z780 Asymptomatic menopausal state: Secondary | ICD-10-CM

## 2011-11-27 DIAGNOSIS — N951 Menopausal and female climacteric states: Secondary | ICD-10-CM

## 2011-11-27 DIAGNOSIS — Z720 Tobacco use: Secondary | ICD-10-CM

## 2011-11-27 DIAGNOSIS — Z13220 Encounter for screening for lipoid disorders: Secondary | ICD-10-CM

## 2011-11-27 DIAGNOSIS — I10 Essential (primary) hypertension: Secondary | ICD-10-CM

## 2011-11-27 DIAGNOSIS — Z13820 Encounter for screening for osteoporosis: Secondary | ICD-10-CM

## 2011-11-27 DIAGNOSIS — R1013 Epigastric pain: Secondary | ICD-10-CM

## 2011-11-27 DIAGNOSIS — K3189 Other diseases of stomach and duodenum: Secondary | ICD-10-CM

## 2011-11-27 MED ORDER — PANTOPRAZOLE SODIUM 40 MG PO TBEC: 40.0000 mg | DELAYED_RELEASE_TABLET | Freq: Every day | ORAL | Status: DC

## 2011-11-27 NOTE — Patient Instructions (Signed)
Get the lab work done fasting- we will call with results Continue your medications We will set up an evening for Dr.Rehman to have your colonoscopy done Set up for Mammogram and Dexa Scan -- Set up for the evening after 3pm Work on the smoking ! F/U in 2 months

## 2011-11-27 NOTE — Telephone Encounter (Signed)
Will place order

## 2011-11-27 NOTE — Progress Notes (Signed)
  Subjective:    Patient ID: Rebekah Gutierrez, female    DOB: 16-Aug-1946, 66 y.o.   MRN: [id number]  HPI Pt here to establish care, previous PCP Dr. Juanetta Gosling. She's not been seen in greater than one year. Previous GYN - Dr. Despina Hidden Previous Nuerosurgeon- Dr. Mikal Plane [facility] Dr. Montez Morita  Medications and history reviewed  Hypertension- history of high blood pressure which is been maintained on Lotrel. She denies any problems with her medications   Bloating/GERD-treated for acid reflux with previously Nexium now protonix.  She had an EGD done in the past which showed hiatal hernia. She continues to have a lot of bloating with the smallest amount of food in some epigastric discomfort. She is also overdue for her colonoscopy.  Tobacco - she uses Nicorette gum on the weekends however continues to smoke half a pack per day she has 30+ pack years  Insomnia- has some difficulty sleeping. She attributes some of this to drinking a lot of soda during the night and having to get to the rest room.   Overdue for Mammogram Overdue PAP Overdue Dexa Overdue Colonoscopy   Review of Systems GEN- denies fatigue, fever, weight loss,weakness, recent illness HEENT- denies eye drainage, change in vision, nasal discharge, CVS- denies chest pain, palpitations RESP- denies SOB, cough, wheeze ABD- denies N/V, change in stools, abd pain GU- denies dysuria, hematuria, dribbling, incontinence MSK- denies joint pain, muscle aches, injury Neuro- denies headache, dizziness, syncope, seizure activity       Objective:   Physical Exam GEN- NAD, alert and oriented x3 HEENT- PERRL, EOMI, non injected sclera, pink conjunctiva, MMM, oropharynx clear- dentures top row, partial bottom Neck- Supple, no thryomegaly, no bruit CVS- RRR, no murmur RESP-CTAB ABD- NABS, soft, NT, ND  EXT- No edema Pulses- Radial, DP- 2+        Assessment & Plan:

## 2011-11-29 DIAGNOSIS — R1013 Epigastric pain: Secondary | ICD-10-CM | POA: Insufficient documentation

## 2011-11-29 DIAGNOSIS — Z720 Tobacco use: Secondary | ICD-10-CM | POA: Insufficient documentation

## 2011-11-29 DIAGNOSIS — G4700 Insomnia, unspecified: Secondary | ICD-10-CM | POA: Insufficient documentation

## 2011-11-29 DIAGNOSIS — E669 Obesity, unspecified: Secondary | ICD-10-CM | POA: Insufficient documentation

## 2011-11-29 NOTE — Assessment & Plan Note (Signed)
No medications needed at this time, encourage good sleep hygiene

## 2011-11-29 NOTE — Assessment & Plan Note (Signed)
Will continue PPI, refer to GI for evaluation and colonoscopy

## 2011-11-29 NOTE — Assessment & Plan Note (Signed)
Continue current medication.Labs to be done

## 2011-11-30 ENCOUNTER — Ambulatory Visit (HOSPITAL_COMMUNITY)
Admission: RE | Admit: 2011-11-30 | Discharge: 2011-11-30 | Disposition: A | Discharge status: Home / Self Care | Payer: BC Managed Care – PPO | Source: Ambulatory Visit | Attending: Family Medicine | Admitting: Family Medicine

## 2011-11-30 ENCOUNTER — Ambulatory Visit (HOSPITAL_COMMUNITY): Payer: BC Managed Care – PPO

## 2011-11-30 DIAGNOSIS — Z780 Asymptomatic menopausal state: Secondary | ICD-10-CM | POA: Insufficient documentation

## 2011-11-30 DIAGNOSIS — N951 Menopausal and female climacteric states: Secondary | ICD-10-CM

## 2011-11-30 DIAGNOSIS — F17200 Nicotine dependence, unspecified, uncomplicated: Secondary | ICD-10-CM | POA: Insufficient documentation

## 2011-11-30 DIAGNOSIS — Z13820 Encounter for screening for osteoporosis: Secondary | ICD-10-CM | POA: Insufficient documentation

## 2011-12-02 LAB — COMPREHENSIVE METABOLIC PANEL
BUN: 21 mg/dL (ref 6–23)
CO2: 27 mEq/L (ref 19–32)
Creat: 0.84 mg/dL (ref 0.50–1.10)
Glucose, Bld: 107 mg/dL — ABNORMAL HIGH (ref 70–99)
Total Bilirubin: 0.7 mg/dL (ref 0.3–1.2)
Total Protein: 7.4 g/dL (ref 6.0–8.3)

## 2011-12-02 LAB — CBC
HCT: 42.5 % (ref 36.0–46.0)
MCV: 86.7 fL (ref 78.0–100.0)
Platelets: 283 10*3/uL (ref 150–400)
RBC: 4.9 MIL/uL (ref 3.87–5.11)
WBC: 4.1 10*3/uL (ref 4.0–10.5)

## 2011-12-02 LAB — LIPID PANEL
HDL: 35 mg/dL — ABNORMAL LOW (ref >39)
Total CHOL/HDL Ratio: 4 Ratio

## 2011-12-06 LAB — VITAMIN D 1,25 DIHYDROXY: Vitamin D 1, 25 (OH)2 Total: 35 pg/mL (ref 18–72)

## 2011-12-13 ENCOUNTER — Ambulatory Visit (HOSPITAL_COMMUNITY)
Admission: RE | Admit: 2011-12-13 | Discharge: 2011-12-13 | Disposition: A | Discharge status: Home / Self Care | Payer: BC Managed Care – PPO | Source: Ambulatory Visit | Attending: Family Medicine | Admitting: Family Medicine

## 2011-12-13 DIAGNOSIS — R922 Inconclusive mammogram: Secondary | ICD-10-CM

## 2011-12-13 DIAGNOSIS — N6459 Other signs and symptoms in breast: Secondary | ICD-10-CM | POA: Insufficient documentation

## 2011-12-13 DIAGNOSIS — Z09 Encounter for follow-up examination after completed treatment for conditions other than malignant neoplasm: Secondary | ICD-10-CM | POA: Insufficient documentation

## 2011-12-18 ENCOUNTER — Encounter (INDEPENDENT_AMBULATORY_CARE_PROVIDER_SITE_OTHER): Payer: Self-pay | Admitting: *Deleted

## 2012-01-26 ENCOUNTER — Encounter: Payer: Self-pay | Admitting: Family Medicine

## 2012-01-26 ENCOUNTER — Ambulatory Visit (INDEPENDENT_AMBULATORY_CARE_PROVIDER_SITE_OTHER): Payer: BC Managed Care – PPO | Admitting: Family Medicine

## 2012-01-26 VITALS — BP 122/80 | HR 71 | Resp 15 | Wt 154.1 lb

## 2012-01-26 DIAGNOSIS — G4700 Insomnia, unspecified: Secondary | ICD-10-CM

## 2012-01-26 DIAGNOSIS — R1013 Epigastric pain: Secondary | ICD-10-CM

## 2012-01-26 DIAGNOSIS — Z720 Tobacco use: Secondary | ICD-10-CM

## 2012-01-26 DIAGNOSIS — H8110 Benign paroxysmal vertigo, unspecified ear: Secondary | ICD-10-CM

## 2012-01-26 DIAGNOSIS — I10 Essential (primary) hypertension: Secondary | ICD-10-CM

## 2012-01-26 DIAGNOSIS — F17200 Nicotine dependence, unspecified, uncomplicated: Secondary | ICD-10-CM

## 2012-01-26 DIAGNOSIS — K3189 Other diseases of stomach and duodenum: Secondary | ICD-10-CM

## 2012-01-26 MED ORDER — PANTOPRAZOLE SODIUM 40 MG PO TBEC: 40.0000 mg | DELAYED_RELEASE_TABLET | Freq: Every day | ORAL | Status: DC

## 2012-01-26 MED ORDER — AMLODIPINE BESY-BENAZEPRIL HCL 10-20 MG PO CAPS: 1.0000 | ORAL_CAPSULE | Freq: Every day | ORAL | Status: DC

## 2012-01-26 MED ORDER — POTASSIUM CHLORIDE CRYS ER 20 MEQ PO TBCR: 20.0000 meq | EXTENDED_RELEASE_TABLET | Freq: Two times a day (BID) | ORAL | Status: DC

## 2012-01-26 MED ORDER — MECLIZINE HCL 12.5 MG PO TABS: 12.5000 mg | ORAL_TABLET | Freq: Three times a day (TID) | ORAL | Status: DC | PRN

## 2012-01-26 NOTE — Patient Instructions (Signed)
We will schedule an appt with Dr. Karilyn Cota' s office We will mail you a letter with the appt time Continue your other medications Meclizine for dizziness as needed Try Melatonin (Vitamin over the counter for sleep) follow directions on the box  F/U 3 months

## 2012-01-28 ENCOUNTER — Encounter: Payer: Self-pay | Admitting: Family Medicine

## 2012-01-28 DIAGNOSIS — H8110 Benign paroxysmal vertigo, unspecified ear: Secondary | ICD-10-CM | POA: Insufficient documentation

## 2012-01-28 NOTE — Assessment & Plan Note (Signed)
Pt symptoms more consistent with BPV with her position changes, trial of meclizine, if this does not help, carotid dopplers and imaging of neck.

## 2012-01-28 NOTE — Assessment & Plan Note (Signed)
Bp at goal, no change to meds

## 2012-01-28 NOTE — Progress Notes (Signed)
  Subjective:    Patient ID: Rebekah Gutierrez, female    DOB: Oct 28, 1945, 66 y.o.   MRN: [id number]  HPI Pt presnts for routine follow-up visit for blood pressure.  She has been doing well, she needs gastroenterology visit rescheduled Tolerating medications She has been having some difficulty sleeping which has been result of her shift work in the past, no caffeine at bedtime Dizzy spells have been occurring when she moves her head in different directions, she notices it more at work when bends to pick up something. , no syncope or LOC, no dizziness when standing  Review of Systems  GEN- denies fatigue, fever, weight loss,weakness, recent illness HEENT- denies eye drainage, change in vision, nasal discharge, CVS- denies chest pain, palpitations RESP- denies SOB, cough, wheeze ABD- denies N/V, change in stools, abd pain GU- denies dysuria, hematuria, dribbling, incontinence MSK- denies joint pain, muscle aches, injury Neuro- denies headache,+ dizziness, syncope, seizure activity       Objective:   Physical Exam GEN- NAD, alert and oriented x3  BP lying 126/78, sitting 124/78, standing 124/76 HEENT- PERRL, EOMI, non injected sclera, pink conjunctiva, MMM, oropharynx clear- dentures , fundus normal, TM clear bialt, no effusion Neck- Supple, no thryomegaly, no bruit CVS- RRR, no murmur RESP-CTAB ABD- NABS, soft, NT, ND  EXT- No edema Pulses- Radial, DP- 2+ Neuro- no focal deficits, CNII-XII in tact       Assessment & Plan:

## 2012-01-28 NOTE — Assessment & Plan Note (Signed)
Unchanged continue PPI, GI to be rescheduled

## 2012-01-28 NOTE — Assessment & Plan Note (Signed)
Smoking cessation couseling done

## 2012-01-28 NOTE — Assessment & Plan Note (Signed)
Trial of melatonin.  

## 2012-02-21 ENCOUNTER — Ambulatory Visit (INDEPENDENT_AMBULATORY_CARE_PROVIDER_SITE_OTHER): Payer: BC Managed Care – PPO | Admitting: Internal Medicine

## 2012-04-26 ENCOUNTER — Ambulatory Visit (INDEPENDENT_AMBULATORY_CARE_PROVIDER_SITE_OTHER): Payer: BC Managed Care – PPO | Admitting: Family Medicine

## 2012-04-26 ENCOUNTER — Encounter: Payer: Self-pay | Admitting: Family Medicine

## 2012-04-26 VITALS — BP 140/80 | HR 94 | Resp 18 | Ht 60.0 in | Wt 152.1 lb

## 2012-04-26 DIAGNOSIS — K3189 Other diseases of stomach and duodenum: Secondary | ICD-10-CM

## 2012-04-26 DIAGNOSIS — Z1211 Encounter for screening for malignant neoplasm of colon: Secondary | ICD-10-CM

## 2012-04-26 DIAGNOSIS — R1013 Epigastric pain: Secondary | ICD-10-CM

## 2012-04-26 DIAGNOSIS — Z720 Tobacco use: Secondary | ICD-10-CM

## 2012-04-26 DIAGNOSIS — F17200 Nicotine dependence, unspecified, uncomplicated: Secondary | ICD-10-CM

## 2012-04-26 DIAGNOSIS — E669 Obesity, unspecified: Secondary | ICD-10-CM

## 2012-04-26 DIAGNOSIS — R7309 Other abnormal glucose: Secondary | ICD-10-CM

## 2012-04-26 DIAGNOSIS — R252 Cramp and spasm: Secondary | ICD-10-CM

## 2012-04-26 DIAGNOSIS — I10 Essential (primary) hypertension: Secondary | ICD-10-CM

## 2012-04-26 NOTE — Progress Notes (Signed)
  Subjective:    Patient ID: Rebekah Gutierrez, female    DOB: 19-Nov-1945, 66 y.o.   MRN: [id number]  HPI Patient here for chronic routine medical problems. She's doing well. She was unable to see gastroenterology never heard back from our office. She continues to have problems with dysphasia. She's also due for colonoscopy She's not been eating very well and snacking throughout the day. 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 She is due for labs. She's also due for Pap smear with her GYN family tree. She does get cramps in her legs on and off but attributes this to not drinking enough water Medications and history reviewed  No problems with dizziness since last visit   Review of Systems   GEN- denies fatigue, fever, weight loss,weakness, recent illness HEENT- denies eye drainage, change in vision, nasal discharge, CVS- denies chest pain, palpitations RESP- denies SOB, cough, wheeze ABD- denies N/V, change in stools, abd pain GU- denies dysuria, hematuria, dribbling, incontinence MSK- denies joint pain, muscle aches, injury, +muscle spasms Neuro- denies headache, dizziness, syncope, seizure activity      Objective:   Physical Exam GEN- NAD, alert and oriented x3 HEENT- PERRL, EOMI, non injected sclera, pink conjunctiva, MMM, oropharynx clear Neck- Supple,  CVS- RRR, no murmur RESP-CTAB ABD-NABS,soft,NT,ND EXT- No edema Pulses- Radial, DP- 2+ Neuro- CNII-XII in tact, normal muscle tone, sensation in tact lower ext, equal movement bilat extremeties       Assessment & Plan:

## 2012-04-26 NOTE — Patient Instructions (Addendum)
Continue current medications New referral to stomach doctor- Dr. Karilyn Cota Get the labs done- I will call with results Call Dr. Despina Hidden for your PAP Smear  Increase water at least 4 cups a day  No more than 1 soda a day  F/U 4 months

## 2012-04-27 LAB — BASIC METABOLIC PANEL
CO2: 27 mEq/L (ref 19–32)
Calcium: 10.2 mg/dL (ref 8.4–10.5)
Chloride: 103 mEq/L (ref 96–112)
Glucose, Bld: 100 mg/dL — ABNORMAL HIGH (ref 70–99)
Sodium: 141 mEq/L (ref 135–145)

## 2012-04-27 LAB — HEMOGLOBIN A1C: Hgb A1c MFr Bld: 6.4 % — ABNORMAL HIGH (ref <5.7)

## 2012-04-29 ENCOUNTER — Encounter: Payer: Self-pay | Admitting: Family Medicine

## 2012-04-29 DIAGNOSIS — R252 Cramp and spasm: Secondary | ICD-10-CM | POA: Insufficient documentation

## 2012-04-29 NOTE — Assessment & Plan Note (Signed)
Increase water intake, exercise

## 2012-04-29 NOTE — Assessment & Plan Note (Signed)
New referral back to gastroenterology, Dr. Karilyn Cota

## 2012-04-29 NOTE — Assessment & Plan Note (Addendum)
We discussed her eating habits especially her soda and sugary drink intake. She is agreed to decrease to one soda a day. She had elevated glucose on her last set of labs will check A1c

## 2012-04-29 NOTE — Assessment & Plan Note (Signed)
Unchanged, smoking cessation discussed

## 2012-04-29 NOTE — Assessment & Plan Note (Signed)
BP at goal, needs to work on improving diet decreasing soda and sugary drinks and increasing water intake.

## 2012-05-16 ENCOUNTER — Other Ambulatory Visit: Payer: Self-pay | Admitting: Obstetrics & Gynecology

## 2012-05-16 ENCOUNTER — Other Ambulatory Visit (HOSPITAL_COMMUNITY)
Admission: RE | Admit: 2012-05-16 | Discharge: 2012-05-16 | Disposition: A | Discharge status: Home / Self Care | Payer: BC Managed Care – PPO | Source: Ambulatory Visit | Attending: Obstetrics & Gynecology | Admitting: Obstetrics & Gynecology

## 2012-05-16 DIAGNOSIS — Z01419 Encounter for gynecological examination (general) (routine) without abnormal findings: Secondary | ICD-10-CM | POA: Insufficient documentation

## 2012-06-05 ENCOUNTER — Ambulatory Visit (INDEPENDENT_AMBULATORY_CARE_PROVIDER_SITE_OTHER): Payer: BC Managed Care – PPO | Admitting: Internal Medicine

## 2012-06-05 ENCOUNTER — Other Ambulatory Visit (INDEPENDENT_AMBULATORY_CARE_PROVIDER_SITE_OTHER): Payer: Self-pay | Admitting: *Deleted

## 2012-06-05 ENCOUNTER — Encounter (INDEPENDENT_AMBULATORY_CARE_PROVIDER_SITE_OTHER): Payer: Self-pay | Admitting: Internal Medicine

## 2012-06-05 ENCOUNTER — Telehealth (INDEPENDENT_AMBULATORY_CARE_PROVIDER_SITE_OTHER): Payer: Self-pay | Admitting: *Deleted

## 2012-06-05 VITALS — BP 100/60 | HR 76 | Temp 98.7°F | Ht 60.0 in | Wt 155.3 lb

## 2012-06-05 DIAGNOSIS — Z1211 Encounter for screening for malignant neoplasm of colon: Secondary | ICD-10-CM

## 2012-06-05 HISTORY — DX: Encounter for screening for malignant neoplasm of colon: Z12.11

## 2012-06-05 NOTE — Progress Notes (Signed)
Subjective:     Patient ID: Rebekah Gutierrez, female   DOB: 07/29/1946, 66 y.o.   MRN: [id number]  HPI Referred by Dr. Jeanice Lim for a colonoscopy. Her last colonoscopy was greater than 10 yrs ago which was normal. She denies any GI problems at this time. Appetite is good. No weight loss. No abdominal pain. She has a BM about 1-2 times a day. No melena or bright red rectal bleeding.  No acid reflux with Protonix.  She tells me she is not having any GI problems.  Review of Systems  see hpi   Current Outpatient Prescriptions  Medication Sig Dispense Refill  . amLODipine-benazepril (LOTREL) 10-20 MG per capsule Take 1 capsule by mouth daily.  90 capsule  1  . calcium carbonate (OS-CAL) 600 MG TABS Take 600 mg by mouth 2 (two) times daily with a meal.      . cyanocobalamin 2000 MCG tablet Take 2,000 mcg by mouth daily.      . cycloSPORINE (RESTASIS) 0.05 % ophthalmic emulsion 1 drop 2 (two) times daily.      . Melatonin 3 MG CAPS Take by mouth.      . Multiple Vitamin (MULTIVITAMIN) capsule Take 1 capsule by mouth daily.      . pantoprazole (PROTONIX) 40 MG tablet Take 1 tablet (40 mg total) by mouth daily.  90 tablet  1  . potassium chloride SA (K-DUR,KLOR-CON) 20 MEQ tablet Take 1 tablet (20 mEq total) by mouth 2 (two) times daily.  180 tablet  1  . meclizine (ANTIVERT) 12.5 MG tablet Take 1 tablet (12.5 mg total) by mouth 3 (three) times daily as needed for dizziness or nausea.  60 tablet  1   Past Medical History  Diagnosis Date  . Hypertension   . GERD (gastroesophageal reflux disease)   . Hiatal hernia    Past Surgical History  Procedure Date  . Back surgery   . Cervical spine surgery     fusion  . Bladder surgery   . Foot surgery     left   . Hand surgery     left   History   Social History  . Marital Status: Divorced    Spouse Name: N/A    Number of Children: N/A  . Years of Education: N/A   Occupational History  . Not on file.   Social History Main Topics  . Smoking  status: Current Every Day Smoker -- 0.5 packs/day  . Smokeless tobacco: Not on file  . Alcohol Use: No  . Drug Use: No  . Sexually Active: Not on file   Other Topics Concern  . Not on file   Social History Narrative  . No narrative on file   Family Status  Relation Status Death Age  . Mother Deceased     unknown  . Father Deceased     Pancreatitic cancer  . Sister Alive     good health (half sisters)  . Brother Alive     half brother good health   No Known Allergies   Objective:   Physical Exam  Filed Vitals:   06/05/12 1626  BP: 100/60  Pulse: 76  Temp: 98.7 F (37.1 C)  Height: 5' (1.524 m)  Weight: 155 lb 4.8 oz (70.444 kg)   Alert and oriented. Skin warm and dry. Oral mucosa is moist.   . Sclera anicteric, conjunctivae is pink. Thyroid not enlarged. No cervical lymphadenopathy. Lungs clear. Heart regular rate and rhythm.  Abdomen is soft.  Bowel sounds are positive. No hepatomegaly. No abdominal masses felt. No tenderness.  No edema to lower extremities.       Assessment:    Normal Exam. In need of screening colonoscopy.   Plan:    Screening colonoscopy.

## 2012-06-05 NOTE — Telephone Encounter (Signed)
Patient needs trilyte 

## 2012-06-05 NOTE — Patient Instructions (Addendum)
Colonoscopy with DR. Rehman. The risks and benefits such as perforation, bleeding, and infection were reviewed with the patient and is agreeable. 

## 2012-06-06 MED ORDER — PEG 3350-KCL-NA BICARB-NACL 420 G PO SOLR: 4000.0000 mL | Freq: Once | ORAL | Status: DC

## 2012-06-19 MED ORDER — SODIUM CHLORIDE 0.45 % IV SOLN
INTRAVENOUS | Status: DC
  Administered 2012-06-20: 14:00:00 via INTRAVENOUS

## 2012-06-20 ENCOUNTER — Ambulatory Visit (HOSPITAL_COMMUNITY)
Admission: RE | Admit: 2012-06-20 | Discharge: 2012-06-20 | Disposition: A | Discharge status: Home / Self Care | Payer: BC Managed Care – PPO | Source: Ambulatory Visit | Attending: Internal Medicine | Admitting: Internal Medicine

## 2012-06-20 ENCOUNTER — Encounter (HOSPITAL_COMMUNITY): Payer: Self-pay | Admitting: *Deleted

## 2012-06-20 ENCOUNTER — Encounter (HOSPITAL_COMMUNITY)
Admission: RE | Disposition: A | Discharge status: Home / Self Care | Payer: Self-pay | Source: Ambulatory Visit | Attending: Internal Medicine

## 2012-06-20 DIAGNOSIS — D129 Benign neoplasm of anus and anal canal: Secondary | ICD-10-CM

## 2012-06-20 DIAGNOSIS — Z1211 Encounter for screening for malignant neoplasm of colon: Secondary | ICD-10-CM | POA: Insufficient documentation

## 2012-06-20 DIAGNOSIS — D126 Benign neoplasm of colon, unspecified: Secondary | ICD-10-CM

## 2012-06-20 DIAGNOSIS — K5730 Diverticulosis of large intestine without perforation or abscess without bleeding: Secondary | ICD-10-CM

## 2012-06-20 DIAGNOSIS — I10 Essential (primary) hypertension: Secondary | ICD-10-CM | POA: Insufficient documentation

## 2012-06-20 DIAGNOSIS — D128 Benign neoplasm of rectum: Secondary | ICD-10-CM

## 2012-06-20 HISTORY — PX: COLONOSCOPY: SHX5424

## 2012-06-20 SURGERY — COLONOSCOPY
Anesthesia: Moderate Sedation

## 2012-06-20 MED ORDER — STERILE WATER FOR IRRIGATION IR SOLN
Status: DC | PRN
  Administered 2012-06-20: 15:00:00

## 2012-06-20 MED ORDER — MIDAZOLAM HCL 5 MG/5ML IJ SOLN
INTRAMUSCULAR | Status: AC
  Filled 2012-06-20: qty 10

## 2012-06-20 MED ORDER — MIDAZOLAM HCL 5 MG/5ML IJ SOLN
INTRAMUSCULAR | Status: DC | PRN
  Administered 2012-06-20 (×2): 2 mg via INTRAVENOUS
  Administered 2012-06-20: 1 mg via INTRAVENOUS

## 2012-06-20 MED ORDER — MEPERIDINE HCL 50 MG/ML IJ SOLN
INTRAMUSCULAR | Status: DC | PRN
  Administered 2012-06-20 (×2): 25 mg via INTRAVENOUS

## 2012-06-20 MED ORDER — MEPERIDINE HCL 50 MG/ML IJ SOLN
INTRAMUSCULAR | Status: AC
  Filled 2012-06-20: qty 1

## 2012-06-20 NOTE — Op Note (Signed)
COLONOSCOPY PROCEDURE REPORT  PATIENT:  Rebekah Gutierrez  MR#:  [id number] Birthdate:  1945/10/29, 66 y.o., female Endoscopist:  Dr. Malissa Hippo, MD Referred By:  Dr. Milinda Antis, MD Procedure Date: 06/20/2012  Procedure:   Colonoscopy  Indications:  Patient is 66 year old African female was undergoing average risk screening colonoscopy. Her last exam was over 10 years ago.  Informed Consent:  The procedure and risks were reviewed with the patient and informed consent was obtained.  Medications:  Demerol 50 mg IV Versed 5 mg IV  Description of procedure:  After a digital rectal exam was performed, that colonoscope was advanced from the anus through the rectum and colon to the area of the cecum, ileocecal valve and appendiceal orifice. The cecum was deeply intubated. These structures were well-seen and photographed for the record. From the level of the cecum and ileocecal valve, the scope was slowly and cautiously withdrawn. The mucosal surfaces were carefully surveyed utilizing scope tip to flexion to facilitate fold flattening as needed. The scope was pulled down into the rectum where a thorough exam including retroflexion was performed.  Findings:   Prep excellent. Mall scattered diverticula throughout the colon. 3 small polyps ablated via cold biopsy from distal sigmoid colon and submitted together. Multiple small rectal polyps spaces are hyperplastic polyps. 4 of these were biopsied and submitted together. Normal rectal mucosa and anorectal junction.  Therapeutic/Diagnostic Maneuvers Performed:  See above  Complications:  None  Cecal Withdrawal Time:  19 minutes  Impression:  Examination performed to cecum. Pancolonic diverticulosis(few small scattered diverticula throughout the colon). Four small polyps ablated via cold biopsy from distal sigmoid colon. Multiple small polyps and rectum suspicious for hyperplastic polyps. Four of these were biopsied and submitted  together.  Recommendations:  Standard instructions given. High fiber diet. I will be contacting patient with biopsy results and further recommendations.  REHMAN,NAJEEB U  06/20/2012 3:10 PM  CC: Dr. Milinda Antis, MD & Dr. Bonnetta Barry ref. provider found

## 2012-06-20 NOTE — H&P (Signed)
Rebekah Gutierrez is an 66 y.o. female.   Chief Complaint: Patient is here for colonoscopy. HPI: Patient is 66 year old female who is in for screening colonoscopy. Last exam was over 10 years ago. She denies abdominal pain change in her bowel habits or rectal bleeding. Family history is negative for colorectal carcinoma.  Past Medical History  Diagnosis Date  . Hypertension   . GERD (gastroesophageal reflux disease)   . Hiatal hernia     Past Surgical History  Procedure Date  . Back surgery   . Cervical spine surgery     fusion  . Bladder surgery   . Foot surgery     left   . Hand surgery     left  . Tubal ligation     Family History  Problem Relation Age of Onset  . Colon cancer Neg Hx    Social History:  reports that she has been smoking Cigarettes.  She has a 22.5 pack-year smoking history. She does not have any smokeless tobacco history on file. She reports that she does not drink alcohol or use illicit drugs.  Allergies: No Known Allergies  Medications Prior to Admission  Medication Sig Dispense Refill  . ibuprofen (ADVIL,MOTRIN) 800 MG tablet Take 800 mg by mouth every 8 (eight) hours as needed.      . polyethylene glycol-electrolytes (NULYTELY/GOLYTELY) 420 G solution Take 4,000 mLs by mouth once.  4000 mL  0  . amLODipine-benazepril (LOTREL) 10-20 MG per capsule Take 1 capsule by mouth daily.  90 capsule  1  . aspirin 325 MG EC tablet Take 325 mg by mouth daily.      . calcium carbonate (OS-CAL) 600 MG TABS Take 600 mg by mouth 2 (two) times daily with a meal.      . cyanocobalamin 2000 MCG tablet Take 2,000 mcg by mouth daily.      . cycloSPORINE (RESTASIS) 0.05 % ophthalmic emulsion 1 drop 2 (two) times daily.      . meclizine (ANTIVERT) 12.5 MG tablet Take 1 tablet (12.5 mg total) by mouth 3 (three) times daily as needed for dizziness or nausea.  60 tablet  1  . Melatonin 3 MG CAPS Take by mouth daily.       . Multiple Vitamin (MULTIVITAMIN) capsule Take 1 capsule by  mouth daily.      . pantoprazole (PROTONIX) 40 MG tablet Take 1 tablet (40 mg total) by mouth daily.  90 tablet  1  . potassium chloride SA (K-DUR,KLOR-CON) 20 MEQ tablet Take 1 tablet (20 mEq total) by mouth 2 (two) times daily.  180 tablet  1    No results found for this or any previous visit (from the past 48 hour(s)). No results found.  ROS  Blood pressure 125/82, pulse 72, temperature 98 F (36.7 C), temperature source Oral, resp. rate 16, height 5' (1.524 m), weight 155 lb (70.308 kg), SpO2 100.00%. Physical Exam  Constitutional: She appears well-developed and well-nourished.  HENT:  Mouth/Throat: Oropharynx is clear and moist.  Eyes: Conjunctivae normal are normal. No scleral icterus.  Neck: No thyromegaly present.  Cardiovascular: Normal rate, regular rhythm and normal heart sounds.   No murmur heard. Respiratory: Effort normal.  GI: Soft. She exhibits no distension and no mass. There is no tenderness.  Musculoskeletal: She exhibits no edema.  Lymphadenopathy:    She has no cervical adenopathy.  Neurological: She is alert.  Skin: Skin is warm and dry.     Assessment/Plan Average risk screening colonoscopy.  REHMAN,NAJEEB U 06/20/2012, 2:31 PM

## 2012-06-27 ENCOUNTER — Encounter (HOSPITAL_COMMUNITY): Payer: Self-pay | Admitting: Internal Medicine

## 2012-08-23 ENCOUNTER — Encounter: Payer: Self-pay | Admitting: Family Medicine

## 2012-08-23 ENCOUNTER — Ambulatory Visit (INDEPENDENT_AMBULATORY_CARE_PROVIDER_SITE_OTHER): Payer: BC Managed Care – PPO | Admitting: Family Medicine

## 2012-08-23 VITALS — BP 130/80 | HR 77 | Resp 16 | Ht 60.0 in | Wt 153.4 lb

## 2012-08-23 DIAGNOSIS — M791 Myalgia, unspecified site: Secondary | ICD-10-CM

## 2012-08-23 DIAGNOSIS — M25519 Pain in unspecified shoulder: Secondary | ICD-10-CM

## 2012-08-23 DIAGNOSIS — I10 Essential (primary) hypertension: Secondary | ICD-10-CM

## 2012-08-23 DIAGNOSIS — E119 Type 2 diabetes mellitus without complications: Secondary | ICD-10-CM

## 2012-08-23 DIAGNOSIS — R7309 Other abnormal glucose: Secondary | ICD-10-CM

## 2012-08-23 DIAGNOSIS — G4700 Insomnia, unspecified: Secondary | ICD-10-CM

## 2012-08-23 DIAGNOSIS — R252 Cramp and spasm: Secondary | ICD-10-CM

## 2012-08-23 DIAGNOSIS — R7303 Prediabetes: Secondary | ICD-10-CM

## 2012-08-23 DIAGNOSIS — E669 Obesity, unspecified: Secondary | ICD-10-CM

## 2012-08-23 MED ORDER — CYCLOBENZAPRINE HCL 5 MG PO TABS: 5.0000 mg | ORAL_TABLET | Freq: Every evening | ORAL | Status: DC | PRN

## 2012-08-23 MED ORDER — PANTOPRAZOLE SODIUM 40 MG PO TBEC: 40.0000 mg | DELAYED_RELEASE_TABLET | Freq: Every day | ORAL | Status: DC

## 2012-08-23 MED ORDER — AMLODIPINE BESY-BENAZEPRIL HCL 10-20 MG PO CAPS: 1.0000 | ORAL_CAPSULE | Freq: Every day | ORAL | Status: DC

## 2012-08-23 MED ORDER — POTASSIUM CHLORIDE CRYS ER 20 MEQ PO TBCR: 20.0000 meq | EXTENDED_RELEASE_TABLET | Freq: Two times a day (BID) | ORAL | Status: DC

## 2012-08-23 NOTE — Assessment & Plan Note (Signed)
Well controlled 

## 2012-08-23 NOTE — Progress Notes (Signed)
  Subjective:    Patient ID: Rebekah Gutierrez, female    DOB: Feb 27, 1946, 66 y.o.   MRN: [id number]  HPI Pt here to f/u chronic medical problems. Doing well overall, continues to have muscle cramps worse after her long shifts. Melatonin does not keep her asleep.  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   Review of Systems   GEN- denies fatigue, fever, weight loss,weakness, recent illness HEENT- denies eye drainage, change in vision, nasal discharge, CVS- denies chest pain, palpitations RESP- denies SOB, cough, wheeze ABD- denies N/V, change in stools, abd pain GU- denies dysuria, hematuria, dribbling, incontinence MSK- + joint pain, muscle aches, injury Neuro- denies headache, dizziness, syncope, seizure activity      Objective:   Physical Exam GEN- NAD, alert and oriented x3 HEENT- PERRL, EOMI, non injected sclera, pink conjunctiva, MMM, oropharynx clear Neck- Supple, CVS- RRR, no murmur RESP-CTAB EXT- No edema Pulses- Radial, DP- 2+ Msk- rotator cuff in tact bilat, biceps in tact, pain with raising arm above head, good ROM       Assessment & Plan:

## 2012-08-23 NOTE — Assessment & Plan Note (Signed)
Acute pain, start ibuprofen BID , likley msk strain and overuse, if no improvement image

## 2012-08-23 NOTE — Assessment & Plan Note (Signed)
Melantonin has helped some but difficult staying asleep, will add flexeril at bedtime per above this will also help sleep

## 2012-08-23 NOTE — Assessment & Plan Note (Signed)
She has lot a few pounds, continue to watch diet

## 2012-08-23 NOTE — Assessment & Plan Note (Addendum)
Trial of muscle relaxant at bedtime, check CK

## 2012-08-23 NOTE — Patient Instructions (Addendum)
Start muscle relaxant at bedtime Take ibuprofen for shoulder Get the labs done Medications to be refilled F/U 4 months

## 2012-08-26 NOTE — Addendum Note (Signed)
Addended by: Milinda Antis F on: 08/26/2012 01:08 PM   Modules accepted: Orders

## 2012-08-28 ENCOUNTER — Telehealth: Payer: Self-pay | Admitting: Family Medicine

## 2012-08-28 NOTE — Telephone Encounter (Signed)
You can refill

## 2012-08-28 NOTE — Telephone Encounter (Signed)
Can this be refilled here or does it have to come from eye doctor?

## 2012-08-30 ENCOUNTER — Other Ambulatory Visit: Payer: Self-pay

## 2012-08-30 MED ORDER — CYCLOSPORINE 0.05 % OP EMUL: 1.0000 [drp] | Freq: Two times a day (BID) | OPHTHALMIC | Status: DC

## 2012-08-30 NOTE — Telephone Encounter (Signed)
Med refilled.

## 2012-09-12 ENCOUNTER — Encounter: Payer: Self-pay | Admitting: Family Medicine

## 2012-09-12 ENCOUNTER — Ambulatory Visit (INDEPENDENT_AMBULATORY_CARE_PROVIDER_SITE_OTHER): Payer: BC Managed Care – PPO | Admitting: Family Medicine

## 2012-09-12 VITALS — BP 106/70 | HR 98 | Temp 99.4°F | Resp 18 | Ht 60.0 in | Wt 145.0 lb

## 2012-09-12 DIAGNOSIS — M791 Myalgia, unspecified site: Secondary | ICD-10-CM

## 2012-09-12 DIAGNOSIS — J111 Influenza due to unidentified influenza virus with other respiratory manifestations: Secondary | ICD-10-CM

## 2012-09-12 DIAGNOSIS — R062 Wheezing: Secondary | ICD-10-CM

## 2012-09-12 DIAGNOSIS — IMO0001 Reserved for inherently not codable concepts without codable children: Secondary | ICD-10-CM

## 2012-09-12 DIAGNOSIS — R6889 Other general symptoms and signs: Secondary | ICD-10-CM

## 2012-09-12 MED ORDER — GUAIFENESIN-CODEINE 100-10 MG/5ML PO SYRP: 5.0000 mL | ORAL_SOLUTION | Freq: Three times a day (TID) | ORAL | Status: DC | PRN

## 2012-09-12 MED ORDER — OSELTAMIVIR PHOSPHATE 75 MG PO CAPS: 75.0000 mg | ORAL_CAPSULE | Freq: Two times a day (BID) | ORAL | Status: DC

## 2012-09-12 MED ORDER — ALBUTEROL SULFATE HFA 108 (90 BASE) MCG/ACT IN AERS: 2.0000 | INHALATION_SPRAY | Freq: Four times a day (QID) | RESPIRATORY_TRACT | Status: DC | PRN

## 2012-09-12 MED ORDER — AZITHROMYCIN 250 MG PO TABS: ORAL_TABLET | ORAL | Status: AC

## 2012-09-12 NOTE — Patient Instructions (Addendum)
Start flu medicine  Use inhaler at bedtime for wheezing and cough  Zpak  For bacteria Cough syrup  Get labs done F/U as previous

## 2012-09-12 NOTE — Assessment & Plan Note (Addendum)
She continues to have leg cramps and myalgias her CK was elevated to 900 this will be repeated today it is still elevated I will send her to rheumatology for further workup. She's not on any statin medications. Push hydration, recheck with LDH, BMET, EGFR

## 2012-09-12 NOTE — Assessment & Plan Note (Signed)
Flulike symptoms she is higher risk at age 67 smoker as well. I will place her on Tamiflu, will also give her a cough suppressant and cover with Z-Pak as she is a smoker.

## 2012-09-12 NOTE — Assessment & Plan Note (Signed)
She's been having some wheezing episodes with her cough at night I will give her albuterol to cover this.

## 2012-09-12 NOTE — Progress Notes (Signed)
  Subjective:    Patient ID: Rebekah Gutierrez, female    DOB: [birth year], 67 y.o.   MRN: [id number]  HPI  Patient presents with fever body aches cough and congestion for the past 2 days. She's been in the bed the past 36 hours DT fatigue and body aches. She's been wheezing at night time and cough causes her  her sides to hurt. She's also had subjective fever and chills. She did not have flu shot and she continues to smoke. She's had a few episodes of diarrhea nonbloody nonbilious denies any emesis  Review of Systems - per above    GEN-+ fatigue,+ fever, weight loss,weakness, recent illness HEENT- denies eye drainage, change in vision, nasal discharge, CVS- denies chest pain, palpitations RESP- + SOB, +cough,+ wheeze ABD- denies N/V, +change in stools, abd pain GU- denies dysuria, hematuria, dribbling, incontinence MSK- denies joint pain, muscle aches, injury Neuro- denies headache, dizziness, syncope, seizure activity      Objective:   Physical Exam GEN- NAD, alert and oriented x3 HEENT- PERRL, EOMI, non injected sclera, pink conjunctiva, MMM, oropharynx mild injection, TM clear bilat no effusion, no maxillary sinus tenderness,, + Nasal drainage  Neck- Supple, no LAD CVS- RRR, no murmur RESP-harsh cough, scattered rhonchi clear with cough, no wheeze, no rales  ABD-NABS,soft,NT,ND EXT- No edema Pulses- Radial 2+         Assessment & Plan:

## 2012-09-13 LAB — ESTIMATED GFR: GFR, Est African American: 42 mL/min — ABNORMAL LOW

## 2012-09-13 LAB — SEDIMENTATION RATE: Sed Rate: 8 mm/hr (ref 0–22)

## 2012-09-13 LAB — CK TOTAL AND CKMB (NOT AT ARMC)
CK, MB: 2.4 ng/mL (ref 0.3–4.0)
Relative Index: 1.4 (ref 0.0–2.5)
Total CK: 176 U/L (ref 7–177)

## 2012-09-13 LAB — BASIC METABOLIC PANEL
Calcium: 9.8 mg/dL (ref 8.4–10.5)
Creat: 1.51 mg/dL — ABNORMAL HIGH (ref 0.50–1.10)
Sodium: 138 mEq/L (ref 135–145)

## 2012-09-13 LAB — HEPATIC FUNCTION PANEL
AST: 25 U/L (ref 0–37)
Albumin: 4.5 g/dL (ref 3.5–5.2)
Bilirubin, Direct: 0.1 mg/dL (ref 0.0–0.3)
Total Bilirubin: 0.5 mg/dL (ref 0.3–1.2)

## 2012-09-13 LAB — LACTATE DEHYDROGENASE: LDH: 143 U/L (ref 94–250)

## 2012-09-13 MED ORDER — ROPINIROLE HCL 0.25 MG PO TABS: 0.2500 mg | ORAL_TABLET | Freq: Every day | ORAL | Status: DC

## 2012-09-13 NOTE — Addendum Note (Signed)
Addended by: Milinda Antis F on: 09/13/2012 09:11 AM   Modules accepted: Orders

## 2012-12-25 ENCOUNTER — Ambulatory Visit (INDEPENDENT_AMBULATORY_CARE_PROVIDER_SITE_OTHER): Payer: BC Managed Care – PPO | Admitting: Family Medicine

## 2012-12-25 ENCOUNTER — Other Ambulatory Visit: Payer: Self-pay | Admitting: Family Medicine

## 2012-12-25 ENCOUNTER — Encounter: Payer: Self-pay | Admitting: Family Medicine

## 2012-12-25 VITALS — BP 118/72 | HR 96 | Resp 18 | Ht 60.0 in | Wt 150.0 lb

## 2012-12-25 DIAGNOSIS — Z23 Encounter for immunization: Secondary | ICD-10-CM

## 2012-12-25 DIAGNOSIS — I10 Essential (primary) hypertension: Secondary | ICD-10-CM

## 2012-12-25 DIAGNOSIS — R7303 Prediabetes: Secondary | ICD-10-CM

## 2012-12-25 DIAGNOSIS — R252 Cramp and spasm: Secondary | ICD-10-CM

## 2012-12-25 DIAGNOSIS — F17200 Nicotine dependence, unspecified, uncomplicated: Secondary | ICD-10-CM

## 2012-12-25 DIAGNOSIS — Z139 Encounter for screening, unspecified: Secondary | ICD-10-CM

## 2012-12-25 DIAGNOSIS — Z720 Tobacco use: Secondary | ICD-10-CM

## 2012-12-25 DIAGNOSIS — R7309 Other abnormal glucose: Secondary | ICD-10-CM

## 2012-12-25 NOTE — Assessment & Plan Note (Signed)
Controlled, no change in medication DASH diet and commitment to daily physical activity for a minimum of 30 minutes discussed and encouraged, as a part of hypertension management. The importance of attaining a healthy weight is also discussed.  

## 2012-12-25 NOTE — Assessment & Plan Note (Signed)
Needs updated lab and f/u with pCP Patient educated about the importance of limiting  Carbohydrate intake , the need to commit to daily physical activity for a minimum of 30 minutes , and to commit weight loss. The fact that changes in all these areas will reduce or eliminate all together the development of diabetes is stressed.

## 2012-12-25 NOTE — Progress Notes (Signed)
  Subjective:    Patient ID: Geanie Logan, female    DOB: May 30, 1946, 67 y.o.   MRN: [id number]  HPI The PT is here for follow up and re-evaluation of chronic medical conditions, medication management and review of any available recent lab and radiology data.  Preventive health is updated, specifically  Cancer screening and Immunization.    The PT denies any adverse reactions to current medications since the last visit.  There are no new concerns.  There are no specific complaints       Review of Systems See HPI Denies recent fever or chills. Denies sinus pressure, nasal congestion, ear pain or sore throat. Denies chest congestion, productive cough or wheezing. Denies chest pains, palpitations and leg swelling Denies abdominal pain, nausea, vomiting,diarrhea or constipation.   Denies dysuria, frequency, hesitancy or incontinence. Denies joint pain, swelling and limitation in mobility.c/o leg cramps at niht no change with meidcation Denies headaches, seizures, numbness, or tingling. Denies depression, anxiety or insomnia. Denies skin break down or rash.        Objective:   Physical Exam  Patient alert and oriented and in no cardiopulmonary distress.  HEENT: No facial asymmetry, EOMI, no sinus tenderness,  oropharynx pink and moist.  Neck supple no adenopathy.  Chest: Clear to auscultation bilaterally.  CVS: S1, S2 no murmurs, no S3.  ABD: Soft non tender. Bowel sounds normal.  Ext: No edema  MS: Adequate ROM spine, shoulders, hips and knees.  Skin: Intact, no ulcerations or rash noted.  Psych: Good eye contact, normal affect. Memory intact not anxious or depressed appearing.  CNS: CN 2-12 intact, power, tone and sensation normal throughout.       Assessment & Plan:

## 2012-12-25 NOTE — Assessment & Plan Note (Signed)
Uncertain of any benefit with requip, will have PCP re evaluate ongoing use at her next visit

## 2012-12-25 NOTE — Patient Instructions (Addendum)
F/u with Dr Jeanice Lim mid to end May   You need to stop smoking , I am glad that you want to.  You will get information on help for this through the health department.  We will schedule your mammogram at checkout.  Fasting lipid, cmp and hBa1C mid May before  You see Dr Jeanice Lim  TdAP and zostavax today  Blood pressure is excellent, no med changes  You are prediabetic, there are free classes at the hospital to help with eating healthily to keep away diabetes,no appt needed, we will give you paper with the information  It is important that you exercise regularly at least 30 minutes 5 times a week. If you develop chest pain, have severe difficulty breathing, or feel very tired, stop exercising immediately and seek medical attention

## 2012-12-25 NOTE — Assessment & Plan Note (Signed)
Understands the need to quit for health reasons. Patient counseled for approximately 5 minutes regarding the health risks of ongoing nicotine use, specifically all types of cancer, heart disease, stroke and respiratory failure. The options available for help with cessation ,the behavioral changes to assist the process, and the option to either gradully reduce usage  Or abruptly stop.is also discussed. Pt is also encouraged to set specific goals in number of cigarettes used daily, as well as to set a quit date. She will look into program at health dept

## 2012-12-26 NOTE — Addendum Note (Signed)
Addended by: Kandis Fantasia B on: 12/26/2012 05:25 PM   Modules accepted: Orders

## 2012-12-30 ENCOUNTER — Ambulatory Visit (HOSPITAL_COMMUNITY)
Admission: RE | Admit: 2012-12-30 | Discharge: 2012-12-30 | Disposition: A | Discharge status: Home / Self Care | Payer: BC Managed Care – PPO | Source: Ambulatory Visit | Attending: Family Medicine | Admitting: Family Medicine

## 2012-12-30 DIAGNOSIS — Z139 Encounter for screening, unspecified: Secondary | ICD-10-CM

## 2012-12-30 DIAGNOSIS — Z1231 Encounter for screening mammogram for malignant neoplasm of breast: Secondary | ICD-10-CM | POA: Insufficient documentation

## 2013-01-02 ENCOUNTER — Encounter (HOSPITAL_COMMUNITY): Payer: Self-pay | Admitting: Dietician

## 2013-01-02 NOTE — Progress Notes (Signed)
[facility] Diabetes Class Completion  Date:January 02, 2013  Time: 1730  Pt attended [facility]'s Diabetes [facility] Education Class on January 02, 2013.   Patient was educated on the following topics: survival skills (signs and symptoms of hyperglycemia and hypoglycemia, treatment for hypoglycemia, ideal levels for fasting and postprandial blood sugars, goal Hgb A1c level, foot care basics), recommendations for physical activity, carbohydrate metabolism in relation to diabetes, and meal planning (sources of carbohydrate, carbohydrate counting, meal planning strategies, food label reading, and portion control).   Demareon Coldwell A. Kayan, RD, LDN   

## 2013-02-06 ENCOUNTER — Ambulatory Visit (INDEPENDENT_AMBULATORY_CARE_PROVIDER_SITE_OTHER): Payer: BC Managed Care – PPO | Admitting: Family Medicine

## 2013-02-06 ENCOUNTER — Encounter: Payer: Self-pay | Admitting: Family Medicine

## 2013-02-06 VITALS — BP 118/70 | HR 81 | Resp 16 | Wt 150.0 lb

## 2013-02-06 DIAGNOSIS — L720 Epidermal cyst: Secondary | ICD-10-CM | POA: Insufficient documentation

## 2013-02-06 DIAGNOSIS — R7303 Prediabetes: Secondary | ICD-10-CM

## 2013-02-06 DIAGNOSIS — L723 Sebaceous cyst: Secondary | ICD-10-CM

## 2013-02-06 DIAGNOSIS — R252 Cramp and spasm: Secondary | ICD-10-CM

## 2013-02-06 DIAGNOSIS — E538 Deficiency of other specified B group vitamins: Secondary | ICD-10-CM

## 2013-02-06 DIAGNOSIS — E669 Obesity, unspecified: Secondary | ICD-10-CM

## 2013-02-06 DIAGNOSIS — G4700 Insomnia, unspecified: Secondary | ICD-10-CM

## 2013-02-06 DIAGNOSIS — Z720 Tobacco use: Secondary | ICD-10-CM

## 2013-02-06 DIAGNOSIS — I10 Essential (primary) hypertension: Secondary | ICD-10-CM

## 2013-02-06 DIAGNOSIS — F17200 Nicotine dependence, unspecified, uncomplicated: Secondary | ICD-10-CM

## 2013-02-06 DIAGNOSIS — R7309 Other abnormal glucose: Secondary | ICD-10-CM

## 2013-02-06 MED ORDER — AMLODIPINE BESY-BENAZEPRIL HCL 10-20 MG PO CAPS: 1.0000 | ORAL_CAPSULE | Freq: Every day | ORAL | Status: DC

## 2013-02-06 MED ORDER — PANTOPRAZOLE SODIUM 40 MG PO TBEC: 40.0000 mg | DELAYED_RELEASE_TABLET | Freq: Every day | ORAL | Status: DC

## 2013-02-06 MED ORDER — NICOTINE 14 MG/24HR TD PT24: 1.0000 | MEDICATED_PATCH | TRANSDERMAL | Status: DC

## 2013-02-06 MED ORDER — ROPINIROLE HCL 0.25 MG PO TABS: 0.2500 mg | ORAL_TABLET | Freq: Every day | ORAL | Status: DC

## 2013-02-06 MED ORDER — POTASSIUM CHLORIDE CRYS ER 20 MEQ PO TBCR: 20.0000 meq | EXTENDED_RELEASE_TABLET | Freq: Two times a day (BID) | ORAL | Status: DC

## 2013-02-06 MED ORDER — CYCLOSPORINE 0.05 % OP EMUL: 1.0000 [drp] | Freq: Two times a day (BID) | OPHTHALMIC | Status: DC

## 2013-02-06 NOTE — Assessment & Plan Note (Signed)
Refer to dermatology for removal.  

## 2013-02-06 NOTE — Assessment & Plan Note (Signed)
Maintaining weight currently

## 2013-02-06 NOTE — Assessment & Plan Note (Signed)
Repeat A1c discussed maintaining her weight or even losing a few pounds. Discussed proper diet

## 2013-02-06 NOTE — Assessment & Plan Note (Signed)
Blood pressure well controlled, fasting labs

## 2013-02-06 NOTE — Assessment & Plan Note (Signed)
Continue her leg cramps and restless legs. I will have her start the Requip this was sent for to her mail order pharmacy

## 2013-02-06 NOTE — Assessment & Plan Note (Signed)
Chronic issue, she is using meclizine, can also use Tylenol PM

## 2013-02-06 NOTE — Assessment & Plan Note (Signed)
Check B12 levels 

## 2013-02-06 NOTE — Patient Instructions (Signed)
Continue current medications Requip has been sent, try at bedtime for the leg cramps You can try Tylenol PM or benadryl for sleep  Get labs done fasting Restart your Vitamin B12 at least a day  Referral to see dermatology Dr. Margo Aye  F/U 3 months [employer]

## 2013-02-06 NOTE — Assessment & Plan Note (Signed)
Discussed tobacco cessation she smokes about a half a pack of light cigarettes. We will start her on the NicoDerm patches and she is able to afford. She declines any oral medications

## 2013-02-06 NOTE — Progress Notes (Signed)
  Subjective:    Patient ID: Rebekah Gutierrez, female    DOB: 1945/11/03, 67 y.o.   MRN: [id number]  HPI  Patient here to follow chronic medical problems. She has no new concerns. She continues to have leg cramps and aching worse at night. She never received the Requip because it was not sent for her mail order.  Hypertension she's tolerating her blood pressure medications. She's due to have repeat fasting labs she did see been nutritionist regarding her prediabetes she is trying to watch her diet She's [facility] B12 deficiency she is questioning whether she needs to continue her B12 tablets She is a spot on the side of her face she would like to have removed as it is been growing. Has been present for the past couple of years denies any pain or itching from the area. Medications and vitamins reviewed Continues to have problems with her sleep does not want prescription medications she is using the melatonin  Review of Systems  GEN- denies fatigue, fever, weight loss,weakness, recent illness HEENT- denies eye drainage, change in vision, nasal discharge, CVS- denies chest pain, palpitations RESP- denies SOB, cough, wheeze ABD- denies N/V, change in stools, abd pain GU- denies dysuria, hematuria, dribbling, incontinence MSK- denies joint pain,+ muscle aches, injury Neuro- denies headache, dizziness, syncope, seizure activity      Objective:   Physical Exam GEN- NAD, alert and oriented x3 HEENT- PERRL, EOMI, non injected sclera, pink conjunctiva, MMM, oropharynx clear Neck- Supple, no bruit CVS- RRR, no murmur RESP-CTAB ABD-NABS,soft,NT,ND EXT- No edema Skin- Right temple dime size swelling with black center, non fluctuant, NT, no erythema Pulses- Radial, DP- 2+        Assessment & Plan:

## 2013-02-08 LAB — LIPID PANEL
Cholesterol: 138 mg/dL (ref 0–200)
Total CHOL/HDL Ratio: 3.4 Ratio
Triglycerides: 113 mg/dL (ref <150)
VLDL: 23 mg/dL (ref 0–40)

## 2013-02-08 LAB — VITAMIN B12: Vitamin B-12: 1877 pg/mL — ABNORMAL HIGH (ref 211–911)

## 2013-02-08 LAB — COMPREHENSIVE METABOLIC PANEL
AST: 28 U/L (ref 0–37)
Albumin: 4.8 g/dL (ref 3.5–5.2)
Alkaline Phosphatase: 99 U/L (ref 39–117)
BUN: 12 mg/dL (ref 6–23)
Calcium: 10 mg/dL (ref 8.4–10.5)
Chloride: 105 mEq/L (ref 96–112)
Creat: 0.74 mg/dL (ref 0.50–1.10)
Glucose, Bld: 125 mg/dL — ABNORMAL HIGH (ref 70–99)
Potassium: 4.4 mEq/L (ref 3.5–5.3)

## 2013-02-08 LAB — CBC
HCT: 41.3 % (ref 36.0–46.0)
Hemoglobin: 13.6 g/dL (ref 12.0–15.0)
MCV: 81.3 fL (ref 78.0–100.0)
RDW: 14.3 % (ref 11.5–15.5)
WBC: 5.2 10*3/uL (ref 4.0–10.5)

## 2013-02-10 ENCOUNTER — Other Ambulatory Visit: Payer: Self-pay | Admitting: Family Medicine

## 2013-02-10 DIAGNOSIS — L720 Epidermal cyst: Secondary | ICD-10-CM

## 2013-02-10 DIAGNOSIS — L723 Sebaceous cyst: Secondary | ICD-10-CM

## 2013-02-17 ENCOUNTER — Encounter: Payer: Self-pay | Admitting: Family Medicine

## 2013-03-07 ENCOUNTER — Telehealth: Payer: Self-pay | Admitting: Family Medicine

## 2013-03-07 MED ORDER — AMLODIPINE BESY-BENAZEPRIL HCL 10-20 MG PO CAPS: 1.0000 | ORAL_CAPSULE | Freq: Every day | ORAL | Status: DC

## 2013-03-07 NOTE — Telephone Encounter (Signed)
Med refilled.

## 2013-05-13 ENCOUNTER — Ambulatory Visit (INDEPENDENT_AMBULATORY_CARE_PROVIDER_SITE_OTHER): Payer: BC Managed Care – PPO | Admitting: Family Medicine

## 2013-05-13 VITALS — BP 100/60 | HR 80 | Temp 99.7°F | Resp 20 | Wt 150.0 lb

## 2013-05-13 DIAGNOSIS — F17200 Nicotine dependence, unspecified, uncomplicated: Secondary | ICD-10-CM

## 2013-05-13 DIAGNOSIS — I10 Essential (primary) hypertension: Secondary | ICD-10-CM

## 2013-05-13 DIAGNOSIS — Z23 Encounter for immunization: Secondary | ICD-10-CM

## 2013-05-13 DIAGNOSIS — Z720 Tobacco use: Secondary | ICD-10-CM

## 2013-05-13 DIAGNOSIS — R252 Cramp and spasm: Secondary | ICD-10-CM

## 2013-05-13 MED ORDER — POTASSIUM CHLORIDE CRYS ER 20 MEQ PO TBCR: 20.0000 meq | EXTENDED_RELEASE_TABLET | Freq: Two times a day (BID) | ORAL | Status: DC

## 2013-05-13 MED ORDER — PANTOPRAZOLE SODIUM 40 MG PO TBEC: 40.0000 mg | DELAYED_RELEASE_TABLET | Freq: Every day | ORAL | Status: DC

## 2013-05-13 MED ORDER — ROPINIROLE HCL 0.25 MG PO TABS: 0.2500 mg | ORAL_TABLET | Freq: Every day | ORAL | Status: DC

## 2013-05-13 MED ORDER — BENAZEPRIL HCL 20 MG PO TABS: 20.0000 mg | ORAL_TABLET | Freq: Every day | ORAL | Status: DC

## 2013-05-13 NOTE — Patient Instructions (Addendum)
Blood pressure medication changed to benzapril Continue requip for leg cramps Pneumonia shot given  F/U 4 months

## 2013-05-13 NOTE — Progress Notes (Signed)
  Subjective:    Patient ID: Rebekah Gutierrez, female    DOB: 06-23-1946, 67 y.o.   MRN: [id number]  HPI  Pt here to f/u Chronic medical problems Leg cramps and spasm improved with requip has not had any episodes in > 1 months HTN- taking meds, no SE, o dizziness, no HA Due for PNA vaccine  Review of Systems   GEN- denies fatigue, fever, weight loss,weakness, recent illness HEENT- denies eye drainage, change in vision, nasal discharge, CVS- denies chest pain, palpitations RESP- denies SOB, cough, wheeze ABD- denies N/V, change in stools, abd pain GU- denies dysuria, hematuria, dribbling, incontinence MSK- denies joint pain, muscle aches, injury Neuro- denies headache, dizziness, syncope, seizure activity      Objective:   Physical Exam  GEN- NAD, alert and oriented x3 HEENT- PERRL, EOMI, non injected sclera, pink conjunctiva, MMM, oropharynx clear Neck- Supple, no thryomegaly CVS- RRR, no murmur RESP-CTAB EXT- No edema Pulses- Radial, DP- 2+       Assessment & Plan:

## 2013-05-14 ENCOUNTER — Encounter: Payer: Self-pay | Admitting: Family Medicine

## 2013-05-14 NOTE — Addendum Note (Signed)
Addended by: Elvina Mattes T on: 05/14/2013 11:00 AM   Modules accepted: Orders

## 2013-05-14 NOTE — Assessment & Plan Note (Signed)
Continue to work on tobacco cessation.  Pneumonia vaccine was given

## 2013-05-14 NOTE — Assessment & Plan Note (Signed)
Blood pressures over treated. I will change her to benazepril 20 mg and discontinue the amlodipine

## 2013-05-14 NOTE — Assessment & Plan Note (Signed)
Much improved with the use of Requip we will continue

## 2013-05-26 ENCOUNTER — Emergency Department (HOSPITAL_COMMUNITY)
Admission: EM | Admit: 2013-05-26 | Discharge: 2013-05-26 | Disposition: A | Discharge status: Home / Self Care | Payer: BC Managed Care – PPO | Attending: Emergency Medicine | Admitting: Emergency Medicine

## 2013-05-26 ENCOUNTER — Encounter (HOSPITAL_COMMUNITY): Payer: Self-pay | Admitting: *Deleted

## 2013-05-26 DIAGNOSIS — R112 Nausea with vomiting, unspecified: Secondary | ICD-10-CM | POA: Insufficient documentation

## 2013-05-26 DIAGNOSIS — I10 Essential (primary) hypertension: Secondary | ICD-10-CM | POA: Insufficient documentation

## 2013-05-26 DIAGNOSIS — Z79899 Other long term (current) drug therapy: Secondary | ICD-10-CM | POA: Insufficient documentation

## 2013-05-26 DIAGNOSIS — K219 Gastro-esophageal reflux disease without esophagitis: Secondary | ICD-10-CM | POA: Insufficient documentation

## 2013-05-26 DIAGNOSIS — Z7982 Long term (current) use of aspirin: Secondary | ICD-10-CM | POA: Insufficient documentation

## 2013-05-26 DIAGNOSIS — R42 Dizziness and giddiness: Secondary | ICD-10-CM | POA: Insufficient documentation

## 2013-05-26 DIAGNOSIS — F17200 Nicotine dependence, unspecified, uncomplicated: Secondary | ICD-10-CM | POA: Insufficient documentation

## 2013-05-26 DIAGNOSIS — Z8719 Personal history of other diseases of the digestive system: Secondary | ICD-10-CM | POA: Insufficient documentation

## 2013-05-26 HISTORY — DX: Dizziness and giddiness: R42

## 2013-05-26 MED ORDER — MECLIZINE HCL 50 MG PO TABS: 50.0000 mg | ORAL_TABLET | Freq: Three times a day (TID) | ORAL | Status: DC | PRN

## 2013-05-26 NOTE — ED Provider Notes (Signed)
CSN: [id number]     Arrival date & time 05/26/13  1036 History  This chart was scribed for Vida Roller, MD by Quintella Reichert, ED scribe.  This patient was seen in room [facility]04/[facility]04 and the patient's care was started at 11:17 AM.  Chief Complaint  Patient presents with  . Dizziness    The history is provided by the patient. No language interpreter was used.    HPI Comments: Rebekah Gutierrez is a 67 y.o. female with h/o vertigo and HTN who presents to the Emergency Department complaining of acute onset of positional dizziness that began this morning with associated nausea and vomiting.  Pt states that she felt well this morning but 2 hours ago while at work she began to feel dizzy.  She states that whenever she changes position she feels like her head is spinning and she has to hold on to something to not fall over.  She also becomes nauseous in association with her dizziness and has vomited several times.  She does not feel dizzy when she is lying still.  She denies headache, focal weakness, or CP.  She notes that she has been diagnosed with vertigo on 2-3 instances in the past and her present symptoms feel the same.  She is not taking medications for this regularly.     Past Medical History  Diagnosis Date  . Hypertension   . GERD (gastroesophageal reflux disease)   . Hiatal hernia   . Vertigo     Past Surgical History  Procedure Laterality Date  . Back surgery    . Cervical spine surgery      fusion  . Bladder surgery    . Foot surgery      left   . Hand surgery      left  . Tubal ligation    . Colonoscopy  06/20/2012    Procedure: COLONOSCOPY;  Surgeon: Malissa Hippo, MD;  Location: [facility];  Service: Endoscopy;  Laterality: N/A;  250    Family History  Problem Relation Age of Onset  . Colon cancer Neg Hx     History  Substance Use Topics  . Smoking status: Current Every Day Smoker -- 0.50 packs/day for 45 years    Types: Cigarettes  . Smokeless tobacco: Not  on file  . Alcohol Use: No    OB History   Grav Para Term Preterm Abortions TAB SAB Ect Mult Living                  Review of Systems  All other systems reviewed and are negative.     Allergies  Review of patient's allergies indicates no known allergies.  Home Medications   Current Outpatient Rx  Name  Route  Sig  Dispense  Refill  . aspirin 325 MG EC tablet   Oral   Take 325 mg by mouth daily.         . benazepril (LOTENSIN) 20 MG tablet   Oral   Take 1 tablet (20 mg total) by mouth daily.   90 tablet   1     D/C lotrel   . calcium carbonate (OS-CAL) 600 MG TABS   Oral   Take 600 mg by mouth 2 (two) times daily with a meal.         . cycloSPORINE (RESTASIS) 0.05 % ophthalmic emulsion   Both Eyes   Place 1 drop into both eyes 2 (two) times daily.   0.4 each  1     90 day supply   . ibuprofen (ADVIL,MOTRIN) 200 MG tablet   Oral   Take 600 mg by mouth every 6 (six) hours as needed for pain.         . Melatonin 3 MG CAPS   Oral   Take 3 mg by mouth at bedtime.          . Multiple Vitamin (MULTIVITAMIN WITH MINERALS) TABS tablet   Oral   Take 1 tablet by mouth daily.         . pantoprazole (PROTONIX) 40 MG tablet   Oral   Take 1 tablet (40 mg total) by mouth daily.   90 tablet   1   . potassium chloride SA (K-DUR,KLOR-CON) 20 MEQ tablet   Oral   Take 1 tablet (20 mEq total) by mouth 2 (two) times daily.   180 tablet   1   . rOPINIRole (REQUIP) 0.25 MG tablet   Oral   Take 1 tablet (0.25 mg total) by mouth at bedtime. For leg cramps   90 tablet   1   . meclizine (ANTIVERT) 50 MG tablet   Oral   Take 1 tablet (50 mg total) by mouth 3 (three) times daily as needed.   30 tablet   0    BP 114/78  Pulse 71  Temp(Src) 97.6 F (36.4 C) (Oral)  Resp 18  Ht 5' (1.524 m)  Wt 150 lb (68.04 kg)  BMI 29.3 kg/m2  SpO2 100%  Physical Exam  Nursing note and vitals reviewed. Constitutional: She appears well-developed and  well-nourished. No distress.  HENT:  Head: Normocephalic and atraumatic.  Mouth/Throat: Oropharynx is clear and moist. No oropharyngeal exudate.  Tympanic membranes are clear bilaterally, no erythema or effusions are present  Eyes: Conjunctivae and EOM are normal. Pupils are equal, round, and reactive to light. Right eye exhibits no discharge. Left eye exhibits no discharge. No scleral icterus.  Neck: Normal range of motion. Neck supple. No JVD present. No thyromegaly present.  Cardiovascular: Normal rate, regular rhythm, normal heart sounds and intact distal pulses.  Exam reveals no gallop and no friction rub.   No murmur heard. Pulmonary/Chest: Effort normal and breath sounds normal. No respiratory distress. She has no wheezes. She has no rales.  Abdominal: Soft. Bowel sounds are normal. She exhibits no distension and no mass. There is no tenderness.  Musculoskeletal: Normal range of motion. She exhibits no edema and no tenderness.  Lymphadenopathy:    She has no cervical adenopathy.  Neurological: She is alert. Coordination normal.  Speech is clear, cranial nerves III through XII are intact, memory is intact, strength is normal in all 4 extremities including grips, sensation is intact to light touch and pinprick in all 4 extremities. Coordination as tested by finger-nose-finger is normal, no limb ataxia. Normal gait, normal reflexes at the patellar tendons bilaterally, the patient does have inducible mild nystagmus and reproducible vertigo when she leans forward. This is resolved with leaning backward  Skin: Skin is warm and dry. No rash noted. No erythema.  Psychiatric: She has a normal mood and affect. Her behavior is normal.    ED Course  Procedures (including critical care time)  DIAGNOSTIC STUDIES: Oxygen Saturation is 100% on room air, normal by my interpretation.    COORDINATION OF CARE: 11:24 AM-Discussed treatment plan which includes vertigo medicine with pt at bedside and pt  agreed to plan.    Labs Review Labs Reviewed - No data to  display  Imaging Review No results found.  MDM   1. Vertigo    Overall the patient appears hemodynamically stable, she has inducible nystagmus and vertigo with head movement, this is short lived, fatigable and inducible consistent with a peripheral source. There is no other focal neurologic deficits, she will be treated with medications and discharged home, she appears stable and has expressed her understanding to the discharge instructions.  Meds given in ED:  Medications - No data to display  Discharge Medication List as of 05/26/2013 11:36 AM    START taking these medications   Details  meclizine (ANTIVERT) 50 MG tablet Take 1 tablet (50 mg total) by mouth 3 (three) times daily as needed., Starting 05/26/2013, Until Discontinued, Print            I personally performed the services described in this documentation, which was scribed in my presence. The recorded information has been reviewed and is accurate.      Vida Roller, MD 05/27/13 [phone]

## 2013-05-26 NOTE — ED Notes (Signed)
Pt alert & oriented x4, stable gait. Patient given discharge instructions, paperwork & prescription(s). Patient  instructed to stop at the registration desk to finish any additional paperwork. Patient verbalized understanding. Pt left department w/ no further questions. 

## 2013-05-26 NOTE — ED Notes (Signed)
Onset n/v, dizziness 9 am, hx of vertigo

## 2013-07-23 ENCOUNTER — Ambulatory Visit (INDEPENDENT_AMBULATORY_CARE_PROVIDER_SITE_OTHER): Payer: BC Managed Care – PPO | Admitting: Family Medicine

## 2013-07-23 VITALS — BP 180/100 | HR 69 | Temp 98.5°F | Resp 16 | Ht 60.0 in | Wt 152.0 lb

## 2013-07-23 DIAGNOSIS — I10 Essential (primary) hypertension: Secondary | ICD-10-CM

## 2013-07-23 DIAGNOSIS — I160 Hypertensive urgency: Secondary | ICD-10-CM

## 2013-07-23 DIAGNOSIS — R51 Headache: Secondary | ICD-10-CM

## 2013-07-23 NOTE — Patient Instructions (Signed)
Take the lotrel this evening and then start back once a day  F/U at 3:30 on Friday - Okay to double book

## 2013-07-24 DIAGNOSIS — R519 Headache, unspecified: Secondary | ICD-10-CM | POA: Insufficient documentation

## 2013-07-24 DIAGNOSIS — R51 Headache: Secondary | ICD-10-CM | POA: Insufficient documentation

## 2013-07-24 DIAGNOSIS — I160 Hypertensive urgency: Secondary | ICD-10-CM | POA: Insufficient documentation

## 2013-07-24 NOTE — Assessment & Plan Note (Signed)
Secondary to elevated BP, declined toradol in office, discussed above for BP

## 2013-07-24 NOTE — Assessment & Plan Note (Signed)
BP quite elevated, will have her restart lotrel  Take 1 dose this evening upon leaving clinic F/U 48 hours for BP readings

## 2013-07-24 NOTE — Progress Notes (Signed)
  Subjective:    Patient ID: Ancil Boozer. [name], female    DOB: Dec 29, 1945, 67 y.o.   MRN: [id number]  HPI Pt here with headache for past 2 weeks on and off, relieved by OTC meds but still lingers sone. Has not affected vision, continues to work . Taking BP meds as prescribed, last visit I discontinued the norvasc and continued her on benezapril due to low normotensive readings. Denies CP, SOB.  Has not had her BP checked recently outside of the our office.     Review of Systems  GEN- denies fatigue, fever, weight loss,weakness, recent illness HEENT- denies eye drainage, change in vision, nasal discharge, CVS- denies chest pain, palpitations RESP- denies SOB, cough, wheeze Neuro- +headache, denies dizziness, syncope, seizure activity      Objective:   Physical Exam GEN- NAD, alert and oriented x3, repeat BP both arms 182/100  And  180/98 HEENT- PERRL, EOMI, non injected sclera, pink conjunctiva, MMM, oropharynx clear, no papilledema CVS- RRR, no murmur RESP-CTAB EXT- No edema NEURO-CNII-XII in tact, no focal deficits Pulses- Radial 2+        Assessment & Plan:

## 2013-07-25 ENCOUNTER — Ambulatory Visit (INDEPENDENT_AMBULATORY_CARE_PROVIDER_SITE_OTHER): Payer: BC Managed Care – PPO | Admitting: Family Medicine

## 2013-07-25 VITALS — BP 140/88 | HR 88 | Temp 98.9°F | Resp 20 | Ht 60.0 in | Wt 152.0 lb

## 2013-07-25 DIAGNOSIS — R51 Headache: Secondary | ICD-10-CM

## 2013-07-25 DIAGNOSIS — I10 Essential (primary) hypertension: Secondary | ICD-10-CM

## 2013-07-25 DIAGNOSIS — I160 Hypertensive urgency: Secondary | ICD-10-CM

## 2013-07-25 NOTE — Patient Instructions (Signed)
Continue the lotrel once a day for blood pressure Take tylenol for the head F/U as previous

## 2013-07-27 ENCOUNTER — Encounter: Payer: Self-pay | Admitting: Family Medicine

## 2013-07-27 MED ORDER — AMLODIPINE BESY-BENAZEPRIL HCL 10-20 MG PO CAPS: 1.0000 | ORAL_CAPSULE | Freq: Every day | ORAL | Status: DC

## 2013-07-27 NOTE — Progress Notes (Signed)
  Subjective:    Patient ID: Rebekah Gutierrez. Rebekah Gutierrez, female    DOB: 1945/10/17, 67 y.o.   MRN: [id number]  HPI Pt here for BP recheck. Seen 48hours ago due to Headache x 2 weeks. BP elevated 180/100, restarted on lotrel. Headache is improved some but still present. She has not taken any OTC medications, continues to work without difficulty, no change in vision, no N/V, CP, Dizziness.    Review of Systems - per above  GEN- denies fatigue, fever, weight loss,weakness, recent illness HEENT- denies eye drainage, change in vision, nasal discharge, CVS- denies chest pain, palpitations Neuro- + headache,denies dizziness, syncope, seizure activity       Objective:   Physical Exam  GEN- NAD, alert and oriented x3,  HEENT- PERRL, EOMI, non injected sclera, pink conjunctiva, MMM, oropharynx clear, no papilledema CVS- RRR, no murmur RESP-CTAB EXT- No edema NEURO-CNII-XII in tact, no focal deficits Pulses- Radial 2+      Assessment & Plan:

## 2013-07-27 NOTE — Assessment & Plan Note (Signed)
BP much improved, continue lotrel

## 2013-07-27 NOTE — Assessment & Plan Note (Signed)
Improved as BP comes down Advised OTC medications If this is not better by Monday after OTC meds, will image as she did have hypertensive urgency

## 2013-08-06 ENCOUNTER — Telehealth: Payer: Self-pay | Admitting: Family Medicine

## 2013-08-06 NOTE — Telephone Encounter (Signed)
Rebekah Gutierrez is needing a refill on her BP medication  Pharmacy [facility] (she believes that's the pharmacy) Call back [phone]

## 2013-08-06 NOTE — Telephone Encounter (Signed)
Called pt to inform that this medication was refilled on 07/27/13

## 2013-08-12 ENCOUNTER — Telehealth: Payer: Self-pay | Admitting: Family Medicine

## 2013-08-12 NOTE — Telephone Encounter (Signed)
Pt states that when she was here in nov that dr Jeanice Lim had told her  That she was going to call it in but she still doesn't have any of the Lotrel and she doesn't know if [facility] has [facility] it or what if someone could call her back  Call back number [phone]

## 2013-08-13 NOTE — Telephone Encounter (Signed)
Called pt today and voice mail has not been set up

## 2013-08-18 NOTE — Telephone Encounter (Signed)
LMTRC

## 2013-09-12 ENCOUNTER — Ambulatory Visit: Payer: BC Managed Care – PPO | Admitting: Family Medicine

## 2013-09-23 ENCOUNTER — Ambulatory Visit (INDEPENDENT_AMBULATORY_CARE_PROVIDER_SITE_OTHER): Payer: BC Managed Care – PPO | Admitting: Family Medicine

## 2013-09-23 ENCOUNTER — Encounter: Payer: Self-pay | Admitting: Family Medicine

## 2013-09-23 VITALS — BP 110/60 | HR 88 | Temp 98.2°F | Resp 20 | Ht 60.0 in | Wt 150.0 lb

## 2013-09-23 DIAGNOSIS — R7309 Other abnormal glucose: Secondary | ICD-10-CM

## 2013-09-23 DIAGNOSIS — F17200 Nicotine dependence, unspecified, uncomplicated: Secondary | ICD-10-CM

## 2013-09-23 DIAGNOSIS — Z720 Tobacco use: Secondary | ICD-10-CM

## 2013-09-23 DIAGNOSIS — R252 Cramp and spasm: Secondary | ICD-10-CM

## 2013-09-23 DIAGNOSIS — I10 Essential (primary) hypertension: Secondary | ICD-10-CM

## 2013-09-23 DIAGNOSIS — R7303 Prediabetes: Secondary | ICD-10-CM

## 2013-09-23 LAB — BASIC METABOLIC PANEL
BUN: 22 mg/dL (ref 6–23)
CHLORIDE: 106 meq/L (ref 96–112)
CO2: 24 mEq/L (ref 19–32)
CREATININE: 0.9 mg/dL (ref 0.50–1.10)
Calcium: 9.6 mg/dL (ref 8.4–10.5)
Glucose, Bld: 98 mg/dL (ref 70–99)
Potassium: 4.4 mEq/L (ref 3.5–5.3)
Sodium: 137 mEq/L (ref 135–145)

## 2013-09-23 NOTE — Assessment & Plan Note (Signed)
Increase Requip to 2 tablets at bedtime

## 2013-09-23 NOTE — Assessment & Plan Note (Signed)
Discussed tobacco cessation. She is willing to try the new paper cigarette to see if this helps

## 2013-09-23 NOTE — Assessment & Plan Note (Signed)
Blood pressure well controlled at current meds. check metabolic panel

## 2013-09-23 NOTE — Progress Notes (Signed)
   Subjective:    Patient ID: Rebekah Gutierrez. Rebekah Gutierrez, female    DOB: 1946-01-04, 68 y.o.   MRN: [id number]  HPI  Patient here to followup chronic medical problems. She does continue to have some cramping in her legs and thighs at nighttime. The Requip was helping at the one tablet at bedtime but she feels like it is not strong enough. She's not had any further dizzy spells with her blood pressure. She did have a recent URI which she uses over-the-counter medications for this is now resolved. Medications reviewed-she is due for lab  Review of Systems  GEN- denies fatigue, fever, weight loss,weakness, recent illness HEENT- denies eye drainage, change in vision, nasal discharge, CVS- denies chest pain, palpitations RESP- denies SOB, cough, wheeze MSK- denies joint pain, muscle aches, injury Neuro- denies headache, dizziness, syncope, seizure activity      Objective:   Physical Exam  GEN- NAD, alert and oriented x3 HEENT- PERRL, EOMI, non injected sclera, pink conjunctiva, MMM, oropharynx clear, TM clear bilat Neck- Supple, no bruit CVS- RRR, no murmur RESP-CTAB EXT- No edema Pulses- Radial, DP- 2+       Assessment & Plan:

## 2013-09-23 NOTE — Assessment & Plan Note (Signed)
Discussed diet. We'll recheck A1c

## 2013-09-23 NOTE — Patient Instructions (Addendum)
Increase requip to 2 tablets at bedtime and see how this works Continue all other medications We will call with lab results  F/U 4 months

## 2013-09-24 LAB — CBC WITH DIFFERENTIAL/PLATELET
Basophils Absolute: 0 10*3/uL (ref 0.0–0.1)
Basophils Relative: 0 % (ref 0–1)
EOS ABS: 0.1 10*3/uL (ref 0.0–0.7)
EOS PCT: 1 % (ref 0–5)
HEMATOCRIT: 37.4 % (ref 36.0–46.0)
Hemoglobin: 12.4 g/dL (ref 12.0–15.0)
Lymphocytes Relative: 60 % — ABNORMAL HIGH (ref 12–46)
Lymphs Abs: 3.7 10*3/uL (ref 0.7–4.0)
MCH: 27.3 pg (ref 26.0–34.0)
MCHC: 33.2 g/dL (ref 30.0–36.0)
MCV: 82.2 fL (ref 78.0–100.0)
MONO ABS: 0.5 10*3/uL (ref 0.1–1.0)
Monocytes Relative: 7 % (ref 3–12)
Neutro Abs: 1.9 10*3/uL (ref 1.7–7.7)
Neutrophils Relative %: 32 % — ABNORMAL LOW (ref 43–77)
Platelets: 254 10*3/uL (ref 150–400)
RBC: 4.55 MIL/uL (ref 3.87–5.11)
RDW: 13.9 % (ref 11.5–15.5)
WBC: 6.2 10*3/uL (ref 4.0–10.5)

## 2013-09-24 LAB — HEMOGLOBIN A1C
Hgb A1c MFr Bld: 6.7 % — ABNORMAL HIGH (ref <5.7)
Mean Plasma Glucose: 146 mg/dL — ABNORMAL HIGH (ref <117)

## 2013-10-02 ENCOUNTER — Encounter: Payer: Self-pay | Admitting: *Deleted

## 2013-10-02 NOTE — Telephone Encounter (Signed)
This encounter was created in error - please disregard.

## 2013-10-06 ENCOUNTER — Encounter: Payer: Self-pay | Admitting: *Deleted

## 2013-10-14 ENCOUNTER — Encounter: Payer: Self-pay | Admitting: *Deleted

## 2013-12-08 ENCOUNTER — Telehealth: Payer: Self-pay | Admitting: Family Medicine

## 2013-12-08 DIAGNOSIS — I160 Hypertensive urgency: Secondary | ICD-10-CM

## 2013-12-08 MED ORDER — PANTOPRAZOLE SODIUM 40 MG PO TBEC: 40.0000 mg | DELAYED_RELEASE_TABLET | Freq: Every day | ORAL | Status: DC

## 2013-12-08 MED ORDER — POTASSIUM CHLORIDE CRYS ER 20 MEQ PO TBCR: 20.0000 meq | EXTENDED_RELEASE_TABLET | Freq: Two times a day (BID) | ORAL | Status: DC

## 2013-12-08 MED ORDER — ROPINIROLE HCL 0.25 MG PO TABS: 0.5000 mg | ORAL_TABLET | Freq: Every day | ORAL | Status: DC

## 2013-12-08 MED ORDER — AMLODIPINE BESY-BENAZEPRIL HCL 10-20 MG PO CAPS: 1.0000 | ORAL_CAPSULE | Freq: Every day | ORAL | Status: DC

## 2013-12-08 NOTE — Telephone Encounter (Signed)
Call back number is [phone] Pharmacy is [facility] she states  Pt is needing a refill on 4 medications amLODipine-benazepril (LOTREL) 10-20 MG per capsule pantoprazole (PROTONIX) 40 MG tablet potassium chloride SA (K-DUR,KLOR-CON) 20 MEQ tablet rOPINIRole (REQUIP) 0.25 MG tablet

## 2013-12-08 NOTE — Telephone Encounter (Signed)
Refill appropriate and filled per protocol. 

## 2013-12-11 ENCOUNTER — Telehealth: Payer: Self-pay | Admitting: *Deleted

## 2013-12-11 DIAGNOSIS — I160 Hypertensive urgency: Secondary | ICD-10-CM

## 2013-12-11 MED ORDER — PANTOPRAZOLE SODIUM 40 MG PO TBEC: 40.0000 mg | DELAYED_RELEASE_TABLET | Freq: Every day | ORAL | Status: DC

## 2013-12-11 MED ORDER — POTASSIUM CHLORIDE CRYS ER 20 MEQ PO TBCR: 20.0000 meq | EXTENDED_RELEASE_TABLET | Freq: Two times a day (BID) | ORAL | Status: DC

## 2013-12-11 MED ORDER — AMLODIPINE BESY-BENAZEPRIL HCL 10-20 MG PO CAPS: 1.0000 | ORAL_CAPSULE | Freq: Every day | ORAL | Status: DC

## 2013-12-11 MED ORDER — ROPINIROLE HCL 0.25 MG PO TABS: 0.5000 mg | ORAL_TABLET | Freq: Every day | ORAL | Status: DC

## 2013-12-11 NOTE — Telephone Encounter (Signed)
Received call from patient.   Requested medication refills on Protonix, Potassium, Requip and Lotrel.   Refill appropriate and filled per protocol.

## 2014-01-21 ENCOUNTER — Ambulatory Visit (INDEPENDENT_AMBULATORY_CARE_PROVIDER_SITE_OTHER): Payer: BC Managed Care – PPO | Admitting: Family Medicine

## 2014-01-21 ENCOUNTER — Encounter: Payer: Self-pay | Admitting: Family Medicine

## 2014-01-21 VITALS — BP 134/80 | HR 80 | Temp 98.5°F | Resp 16 | Ht 59.0 in | Wt 150.0 lb

## 2014-01-21 DIAGNOSIS — B351 Tinea unguium: Secondary | ICD-10-CM

## 2014-01-21 DIAGNOSIS — K5900 Constipation, unspecified: Secondary | ICD-10-CM

## 2014-01-21 DIAGNOSIS — I10 Essential (primary) hypertension: Secondary | ICD-10-CM

## 2014-01-21 DIAGNOSIS — R159 Full incontinence of feces: Secondary | ICD-10-CM

## 2014-01-21 DIAGNOSIS — R152 Fecal urgency: Secondary | ICD-10-CM | POA: Insufficient documentation

## 2014-01-21 DIAGNOSIS — G4700 Insomnia, unspecified: Secondary | ICD-10-CM

## 2014-01-21 DIAGNOSIS — E119 Type 2 diabetes mellitus without complications: Secondary | ICD-10-CM

## 2014-01-21 DIAGNOSIS — E1165 Type 2 diabetes mellitus with hyperglycemia: Secondary | ICD-10-CM | POA: Insufficient documentation

## 2014-01-21 MED ORDER — TRAZODONE HCL 50 MG PO TABS: 25.0000 mg | ORAL_TABLET | Freq: Every evening | ORAL | Status: DC | PRN

## 2014-01-21 NOTE — Assessment & Plan Note (Signed)
Blood pressure looks good no change the medication

## 2014-01-21 NOTE — Assessment & Plan Note (Signed)
She's had some constipation therefore take stool softeners also some mild leakage. I wonder she's not evacuating completely due to the constipation. I will have her take a stool softener daily she states that this does help decrease the symptoms of incontinence we'll also get a KUB if needed I will call in gastroenterology we cannot get her regulated better No spinal cord injury or signs of denervation

## 2014-01-21 NOTE — Progress Notes (Signed)
Patient ID: Rebekah Gutierrez, female   DOB: 1946/07/09, 68 y.o.   MRN: [id number]   Subjective:    Patient ID: Rebekah Gutierrez, female    DOB: [birth year], 68 y.o.   MRN: [id number]  Patient presents for 4 month F/U, Insomnia, Increased gas, Increased forgetfullness and Pain to L ear, L side of head and neck   patient here to follow chronic medical problems. She has multiple concerns today. 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 She has been using melatonin does not helping. She will to try something prescription for sleep.  Gas he's had increased bloating and gas. She also notes that at times she has passed gas and has had some fecal incontinence. She does not feel which she has to go the bathroom at times. She has been constipated some days therefore takes a stool softener. She did have a normal colonoscopy get back in 2013. When she does take stool softener this actually improves her gas and the incontinence.  Nail problem-she had her nails done by podiatry did have a pedicure noticed that her nails were thickening of both great toenails. One is starting to split.  Fall she had a fall back in January showing on her side but this is now resolved. 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 The pain is also improved she occasionally takes ibuprofen.  Thursday Friday diabetes mellitus she is not picked up her blood glucose monitor her at last A1c was 6.7% she states she's not felt any different with her blood sugars and has not changed much with her diet.    Review Of Systems:  GEN- denies fatigue, fever, weight loss,weakness, recent illness HEENT- denies eye drainage, change in vision, nasal discharge, CVS- denies chest pain, palpitations RESP- denies SOB, cough, wheeze ABD- denies N/V, +change in stools, abd pain GU- denies dysuria, hematuria, dribbling,  incontinence MSK- + joint pain, muscle aches, injury Neuro- denies headache, dizziness, syncope, seizure activity       Objective:    BP 134/80  Pulse 80  Temp(Src) 98.5 F (36.9 C) (Oral)  Resp 16  Ht 4\' 11"  (1.499 m)  Wt 150 lb (68.04 kg)  BMI 30.28 kg/m2 GEN- NAD, alert and oriented x3 HEENT- PERRL, EOMI, non injected sclera, pink conjunctiva, MMM, oropharynx clear Neck- Supple, Fair ROM, neg spurlings, neg NT CVS- RRR, no murmur RESP-CTAB ABD-NABS,soft,NT,ND Rectum- external skin tags, normal tone, soft stool in vault, no gross blood Skin- Bilat great toes, mild yellow discoloration- thickened from mid-nail up, right great toenail- split near center EXT- No edema Pulses- Radial, DP- 2+         Assessment & Plan:      Problem List Items Addressed This Visit   Type II or unspecified type diabetes mellitus without mention of complication, not stated as uncontrolled - Primary   Relevant Orders      CBC with Differential      Comprehensive metabolic panel      Hemoglobin A1c      Lipid panel   Insomnia   Essential hypertension, benign    Other Visit Diagnoses   Fecal incontinence        Relevant Orders       DG Abd 1 View       Note: This dictation was prepared with Dragon dictation along with smaller phrase technology.  Any transcriptional errors that result from this process are unintentional.

## 2014-01-21 NOTE — Assessment & Plan Note (Signed)
She looks like she may have a mild nail fungus especially with the thickened areas and it started him to split. She is followup with podiatry. I prefer the setting of her above problems and GI issues not to start her on terbinafine for nail fungus at this time.

## 2014-01-21 NOTE — Patient Instructions (Addendum)
Get the xray of your stomach Take the stool softner every day New sleeping medication trazodone GO get the labs done in the next 2 weeks Call foot doctor for the nail New prescription for the meter F/u 4 months

## 2014-01-21 NOTE — Assessment & Plan Note (Signed)
Trial of trazodone. 

## 2014-01-21 NOTE — Assessment & Plan Note (Signed)
Discussed diet. She's been given a prescription for a meter to pick up and she did not get the last one. Recheck A1c before deciding on medications as needed She is on aspirin as well as ACE inhibitor

## 2014-01-22 ENCOUNTER — Encounter: Payer: Self-pay | Admitting: *Deleted

## 2014-01-31 ENCOUNTER — Ambulatory Visit (HOSPITAL_COMMUNITY)
Admission: RE | Admit: 2014-01-31 | Discharge: 2014-01-31 | Disposition: A | Discharge status: Home / Self Care | Payer: BC Managed Care – PPO | Source: Ambulatory Visit | Attending: Family Medicine | Admitting: Family Medicine

## 2014-01-31 DIAGNOSIS — R159 Full incontinence of feces: Secondary | ICD-10-CM

## 2014-01-31 DIAGNOSIS — K5900 Constipation, unspecified: Secondary | ICD-10-CM | POA: Insufficient documentation

## 2014-01-31 LAB — HEMOGLOBIN A1C
HEMOGLOBIN A1C: 6.3 % — AB (ref <5.7)
Mean Plasma Glucose: 134 mg/dL — ABNORMAL HIGH (ref <117)

## 2014-01-31 LAB — COMPREHENSIVE METABOLIC PANEL
ALT: 20 U/L (ref 0–35)
AST: 25 U/L (ref 0–37)
Albumin: 4.3 g/dL (ref 3.5–5.2)
Alkaline Phosphatase: 81 U/L (ref 39–117)
BUN: 12 mg/dL (ref 6–23)
CHLORIDE: 106 meq/L (ref 96–112)
CO2: 27 meq/L (ref 19–32)
CREATININE: 0.7 mg/dL (ref 0.50–1.10)
Calcium: 9.9 mg/dL (ref 8.4–10.5)
Glucose, Bld: 117 mg/dL — ABNORMAL HIGH (ref 70–99)
Potassium: 4.7 mEq/L (ref 3.5–5.3)
SODIUM: 139 meq/L (ref 135–145)
TOTAL PROTEIN: 7.6 g/dL (ref 6.0–8.3)
Total Bilirubin: 0.5 mg/dL (ref 0.2–1.2)

## 2014-01-31 LAB — LIPID PANEL
Cholesterol: 126 mg/dL (ref 0–200)
HDL: 38 mg/dL — ABNORMAL LOW (ref >39)
LDL CALC: 67 mg/dL (ref 0–99)
TRIGLYCERIDES: 104 mg/dL (ref <150)
Total CHOL/HDL Ratio: 3.3 Ratio
VLDL: 21 mg/dL (ref 0–40)

## 2014-01-31 LAB — CBC WITH DIFFERENTIAL/PLATELET
BASOS ABS: 0 10*3/uL (ref 0.0–0.1)
Basophils Relative: 1 % (ref 0–1)
EOS PCT: 2 % (ref 0–5)
Eosinophils Absolute: 0.1 10*3/uL (ref 0.0–0.7)
HCT: 39.9 % (ref 36.0–46.0)
Hemoglobin: 13.9 g/dL (ref 12.0–15.0)
LYMPHS PCT: 55 % — AB (ref 12–46)
Lymphs Abs: 2.4 10*3/uL (ref 0.7–4.0)
MCH: 28.6 pg (ref 26.0–34.0)
MCHC: 34.8 g/dL (ref 30.0–36.0)
MCV: 82.1 fL (ref 78.0–100.0)
Monocytes Absolute: 0.4 10*3/uL (ref 0.1–1.0)
Monocytes Relative: 8 % (ref 3–12)
Neutro Abs: 1.5 10*3/uL — ABNORMAL LOW (ref 1.7–7.7)
Neutrophils Relative %: 34 % — ABNORMAL LOW (ref 43–77)
PLATELETS: 263 10*3/uL (ref 150–400)
RBC: 4.86 MIL/uL (ref 3.87–5.11)
RDW: 13.4 % (ref 11.5–15.5)
WBC: 4.4 10*3/uL (ref 4.0–10.5)

## 2014-02-03 ENCOUNTER — Encounter: Payer: Self-pay | Admitting: *Deleted

## 2014-05-27 ENCOUNTER — Ambulatory Visit (INDEPENDENT_AMBULATORY_CARE_PROVIDER_SITE_OTHER): Payer: BC Managed Care – PPO | Admitting: Family Medicine

## 2014-05-27 ENCOUNTER — Encounter: Payer: Self-pay | Admitting: Family Medicine

## 2014-05-27 VITALS — BP 128/76 | HR 64 | Temp 98.5°F | Resp 14 | Ht 60.0 in | Wt 147.0 lb

## 2014-05-27 DIAGNOSIS — K5900 Constipation, unspecified: Secondary | ICD-10-CM

## 2014-05-27 DIAGNOSIS — Z720 Tobacco use: Secondary | ICD-10-CM

## 2014-05-27 DIAGNOSIS — R159 Full incontinence of feces: Secondary | ICD-10-CM

## 2014-05-27 DIAGNOSIS — I10 Essential (primary) hypertension: Secondary | ICD-10-CM

## 2014-05-27 DIAGNOSIS — E119 Type 2 diabetes mellitus without complications: Secondary | ICD-10-CM

## 2014-05-27 DIAGNOSIS — F17200 Nicotine dependence, unspecified, uncomplicated: Secondary | ICD-10-CM

## 2014-05-27 LAB — HEMOGLOBIN A1C, FINGERSTICK: HEMOGLOBIN A1C, FINGERSTICK: 6.1 % — AB (ref <5.7)

## 2014-05-27 NOTE — Assessment & Plan Note (Signed)
Blood pressure is well-controlled no change in medication 

## 2014-05-27 NOTE — Progress Notes (Signed)
Patient ID: Rebekah Gutierrez, female   DOB: 1946-04-04, 68 y.o.   MRN: [id number]   Subjective:    Patient ID: Rebekah Gutierrez, female    DOB: Feb 02, 1946, 68 y.o.   MRN: [id number]  Patient presents for 4 month F/U  patient here to follow up chronic medical problems. She has no new concerns. She continues to have difficulties with her bowels with constipation and some leakage she often uses her hands to stimulate her rectum to have a bowel movement and she still has had a few occasions where she has a bowel movement with leakage and is not aware of it. She does take magnesium citrate but not on a regular basis because she is concerned about having a bowel movement while she is at work or at [employer]. She denies any abdominal pain no nausea vomiting  Diabetes mellitus her blood sugars which are diet-controlled have ranged from 88-121 out wire was at 154 her last A1c was down to 6.3% she is down about 3 pounds  Declines flu shot    Review Of Systems:  GEN- denies fatigue, fever, weight loss,weakness, recent illness HEENT- denies eye drainage, change in vision, nasal discharge, CVS- denies chest pain, palpitations RESP- denies SOB, cough, wheeze ABD- denies N/V, +change in stools, abd pain GU- denies dysuria, hematuria, dribbling, incontinence MSK- denies joint pain, muscle aches, injury Neuro- denies headache, dizziness, syncope, seizure activity       Objective:    BP 128/76  Pulse 64  Temp(Src) 98.5 F (36.9 C) (Oral)  Resp 14  Ht 5' (1.524 m)  Wt 147 lb (66.679 kg)  BMI 28.71 kg/m2 GEN- NAD, alert and oriented x3 CVS- RRR, no murmur RESP-CTAB ABD-NABS,soft,NT,ND EXT- No edema Pulses- Radial, DP- 2+       Assessment & Plan:      Problem List Items Addressed This Visit   Type II or unspecified type diabetes mellitus without mention of complication, not stated as uncontrolled   Relevant Orders      Hemoglobin A1C, fingerstick   Fecal incontinence alternating with constipation  - Primary      Note: This dictation was prepared with Dragon dictation along with smaller phrase technology. Any transcriptional errors that result from this process are unintentional.

## 2014-05-27 NOTE — Patient Instructions (Signed)
Referral back to Dr. Laural Golden for your stools Try the miralax for your stools  We will call with lab results F/U 4 months

## 2014-05-27 NOTE — Assessment & Plan Note (Signed)
Will refer to gastroenterology. I've given her some MiraLAX to see if she does have severe constipation other this did not show up on her KUB that we did a couple months ago. I think he still needs evaluation by GI to see why she is getting anal leakage.

## 2014-05-27 NOTE — Assessment & Plan Note (Signed)
Diabetes is diet control recheck her A1c today

## 2014-05-27 NOTE — Assessment & Plan Note (Signed)
Counseled on tobacco cessation 

## 2014-05-29 ENCOUNTER — Encounter: Payer: Self-pay | Admitting: *Deleted

## 2014-06-03 ENCOUNTER — Encounter (INDEPENDENT_AMBULATORY_CARE_PROVIDER_SITE_OTHER): Payer: Self-pay | Admitting: *Deleted

## 2014-06-13 ENCOUNTER — Other Ambulatory Visit: Payer: Self-pay | Admitting: Family Medicine

## 2014-06-16 NOTE — Telephone Encounter (Signed)
Medication refilled per protocol. 

## 2014-06-17 ENCOUNTER — Encounter (INDEPENDENT_AMBULATORY_CARE_PROVIDER_SITE_OTHER): Payer: Self-pay | Admitting: Internal Medicine

## 2014-06-17 ENCOUNTER — Ambulatory Visit (INDEPENDENT_AMBULATORY_CARE_PROVIDER_SITE_OTHER): Payer: BC Managed Care – PPO | Admitting: Internal Medicine

## 2014-06-17 VITALS — BP 144/80 | HR 72 | Temp 98.2°F | Ht 60.0 in | Wt 148.3 lb

## 2014-06-17 DIAGNOSIS — R159 Full incontinence of feces: Secondary | ICD-10-CM

## 2014-06-17 NOTE — Patient Instructions (Addendum)
Fiber 4gm pills po, Stop the stool softener. Stool diary. OV in 6 weeks.

## 2014-06-17 NOTE — Progress Notes (Signed)
Subjective:    Patient ID: Rebekah Gutierrez. Rebekah Gutierrez, female    DOB: 07-29-1946, 68 y.o.   MRN: [id number]  HPIHere today with c/o that she has a small bowel movement (incontinent). Sometimes she will be at work, and go to the BR and have a small bowel movement. Takes a stool softener every morning since the first of the year.  She tells me sometimes she becomes constipated. It has occurred maybe 3-4 times.  Last occurrence early part of September while at work. Has always occurred at work.  Patient has scheduled breaks at work. She has a BM twice a day.   Appetite is good. She thinks she has lost about 5 pounds.  There is no abdominal pain. She does have a lot of flatus.  No melena or BRRB rectal bleeding.  CBC    Component Value Date/Time   WBC 4.4 01/31/2014 0839   RBC 4.86 01/31/2014 0839   HGB 13.9 01/31/2014 0839   HCT 39.9 01/31/2014 0839   PLT 263 01/31/2014 0839   MCV 82.1 01/31/2014 0839   MCH 28.6 01/31/2014 0839   MCHC 34.8 01/31/2014 0839   RDW 13.4 01/31/2014 0839   LYMPHSABS 2.4 01/31/2014 0839   MONOABS 0.4 01/31/2014 0839   EOSABS 0.1 01/31/2014 0839   BASOSABS 0.0 01/31/2014 0839        06/20/2012 Colonoscopy: average risk: Dr. Laural Golden:  Impression:  Examination performed to cecum.  Pancolonic diverticulosis(few small scattered diverticula throughout the colon).  Four small polyps ablated via cold biopsy from distal sigmoid colon.  Multiple small polyps and rectum suspicious for hyperplastic polyps. Four of these were biopsied and submitted together.   Notes Recorded by Rogene Houston, MD on 06/24/2012 at 5:21 PM Multiple small polyps hyperplastic. Results reviewed with patient. Next screening in 10 years. Report to PCP    Review of Systems Past Medical History  Diagnosis Date  . Hypertension   . GERD (gastroesophageal reflux disease)   . Hiatal hernia   . Vertigo     Past Surgical History  Procedure Laterality Date  . Back surgery    . Cervical spine surgery        fusion  . Bladder surgery    . Foot surgery      left   . Hand surgery      left  . Tubal ligation    . Colonoscopy  06/20/2012    Procedure: COLONOSCOPY;  Surgeon: Rogene Houston, MD;  Location: [facility];  Service: Endoscopy;  Laterality: N/A;  250    No Known Allergies  Current Outpatient Prescriptions on File Prior to Visit  Medication Sig Dispense Refill  . aspirin 325 MG EC tablet Take 325 mg by mouth daily.      . calcium carbonate (OS-CAL) 600 MG TABS Take 600 mg by mouth 2 (two) times daily with a meal.      . cycloSPORINE (RESTASIS) 0.05 % ophthalmic emulsion Place 1 drop into both eyes 2 (two) times daily.  0.4 each  1  . ibuprofen (ADVIL,MOTRIN) 200 MG tablet Take 600 mg by mouth every 6 (six) hours as needed for pain.      . meclizine (ANTIVERT) 50 MG tablet Take 1 tablet (50 mg total) by mouth 3 (three) times daily as needed.  30 tablet  0  . Multiple Vitamin (MULTIVITAMIN WITH MINERALS) TABS tablet Take 1 tablet by mouth daily.      . pantoprazole (PROTONIX) 40 MG tablet TAKE 1  TABLET DAILY  90 tablet  1  . potassium chloride SA (K-DUR,KLOR-CON) 20 MEQ tablet TAKE 1 TABLET TWICE A DAY  180 tablet  1  . rOPINIRole (REQUIP) 0.25 MG tablet Take 2 tablets (0.5 mg total) by mouth at bedtime. Take 2 tablets at bedtime  180 tablet  1  . amLODipine-benazepril (LOTREL) 10-20 MG per capsule TAKE 1 CAPSULE DAILY  90 capsule  1   No current facility-administered medications on file prior to visit.        Objective:   Physical Exam  Filed Vitals:   06/17/14 1445  BP: 144/80  Pulse: 72  Temp: 98.2 F (36.8 C)  Height: 5' (1.524 m)  Weight: 148 lb 4.8 oz (67.268 kg)   Alert and oriented. Skin warm and dry. Oral mucosa is moist.   . Sclera anicteric, conjunctivae is pink. Thyroid not enlarged. No cervical lymphadenopathy. Lungs clear. Heart regular rate and rhythm.  Abdomen is soft. Bowel sounds are positive. No hepatomegaly. No abdominal masses felt. No tenderness.   No edema to lower extremities.  Stool brown and guaiac negative.        Assessment & Plan:  Fecal incontinence, which I think is related to her schedule at work. All incidences have occurred at work.   Fiber 4 gms. Stop the stool softener. Stool diary.

## 2014-07-29 ENCOUNTER — Encounter (INDEPENDENT_AMBULATORY_CARE_PROVIDER_SITE_OTHER): Payer: Self-pay | Admitting: Internal Medicine

## 2014-07-29 ENCOUNTER — Ambulatory Visit (INDEPENDENT_AMBULATORY_CARE_PROVIDER_SITE_OTHER): Payer: BC Managed Care – PPO | Admitting: Internal Medicine

## 2014-07-29 VITALS — BP 138/74 | HR 76 | Temp 97.9°F | Ht 60.0 in | Wt 153.5 lb

## 2014-07-29 DIAGNOSIS — R159 Full incontinence of feces: Secondary | ICD-10-CM

## 2014-07-29 NOTE — Patient Instructions (Addendum)
Continue Fiber. OV in 6 year.

## 2014-07-29 NOTE — Progress Notes (Signed)
Subjective:    Patient ID: Rebekah Gutierrez. [name], female    DOB: 04-19-1946, 68 y.o.   MRN: [id number]  HPI Here today for f/u. Last seen in October for fecal incontinence.  She is taking the Fiber pills now. She has stopped taking the stool softener. She is taking one a days.  She is not having any incontinence.  Her BMs are normal. She usually has a BM x 2 a day. No abdominal pain.  She is on a schedule for her breaks at work.' Appetite is good. No weight loss. She has actually gained 5 pounds since her last visit.   No melena or BRRB.         CBC  Labs (Brief)       Component Value Date/Time   WBC 4.4 01/31/2014 0839   RBC 4.86 01/31/2014 0839   HGB 13.9 01/31/2014 0839   HCT 39.9 01/31/2014 0839   PLT 263 01/31/2014 0839   MCV 82.1 01/31/2014 0839   MCH 28.6 01/31/2014 0839   MCHC 34.8 01/31/2014 0839   RDW 13.4 01/31/2014 0839   LYMPHSABS 2.4 01/31/2014 0839   MONOABS 0.4 01/31/2014 0839   EOSABS 0.1 01/31/2014 0839   BASOSABS 0.0 01/31/2014 0839          06/20/2012 Colonoscopy: average risk: Dr. Laural Golden:  Impression:  Examination performed to cecum.  Pancolonic diverticulosis(few small scattered diverticula throughout the colon).  Four small polyps ablated via cold biopsy from distal sigmoid colon.  Multiple small polyps and rectum suspicious for hyperplastic polyps. Four of these were biopsied and submitted together.   Notes Recorded by Rogene Houston, MD on 06/24/2012 at 5:21 PM Multiple small polyps hyperplastic. Results reviewed with patient. Next screening in 10 years. Report to PCP    Review of Systems Divorced. 3 children. One has had 2 CVAs. She works at a [employer].     Past Medical History  Diagnosis Date  . Hypertension   . GERD (gastroesophageal reflux disease)   . Hiatal hernia   . Vertigo     Past Surgical History  Procedure Laterality Date  . Back surgery    . Cervical  spine surgery      fusion  . Bladder surgery    . Foot surgery      left   . Hand surgery      left  . Tubal ligation    . Colonoscopy  06/20/2012    Procedure: COLONOSCOPY;  Surgeon: Rogene Houston, MD;  Location: [facility];  Service: Endoscopy;  Laterality: N/A;  250    No Known Allergies  Current Outpatient Prescriptions on File Prior to Visit  Medication Sig Dispense Refill  . amLODipine-benazepril (LOTREL) 10-20 MG per capsule TAKE 1 CAPSULE DAILY 90 capsule 1  . aspirin 325 MG EC tablet Take 325 mg by mouth daily.    . benazepril (LOTENSIN) 20 MG tablet Take 20 mg by mouth daily.    . calcium carbonate (OS-CAL) 600 MG TABS Take 600 mg by mouth 2 (two) times daily with a meal.    . Cinnamon 500 MG TABS Take by mouth.    . cycloSPORINE (RESTASIS) 0.05 % ophthalmic emulsion Place 1 drop into both eyes 2 (two) times daily. 0.4 each 1  . docusate sodium (COLACE) 100 MG capsule Take 100 mg by mouth 2 (two) times daily.    Marland Kitchen ibuprofen (ADVIL,MOTRIN) 200 MG tablet Take 600 mg by mouth every 6 (six) hours as  needed for pain.    . meclizine (ANTIVERT) 50 MG tablet Take 1 tablet (50 mg total) by mouth 3 (three) times daily as needed. 30 tablet 0  . Multiple Vitamin (MULTIVITAMIN WITH MINERALS) TABS tablet Take 1 tablet by mouth daily.    . pantoprazole (PROTONIX) 40 MG tablet TAKE 1 TABLET DAILY 90 tablet 1  . potassium chloride SA (K-DUR,KLOR-CON) 20 MEQ tablet TAKE 1 TABLET TWICE A DAY 180 tablet 1  . rOPINIRole (REQUIP) 0.25 MG tablet Take 2 tablets (0.5 mg total) by mouth at bedtime. Take 2 tablets at bedtime 180 tablet 1   No current facility-administered medications on file prior to visit.       Objective:   Physical Exam  Filed Vitals:   07/29/14 1557  Height: 5' (1.524 m)  Weight: 153 lb 8 oz (69.627 kg)   Alert and oriented. Skin warm and dry. Oral mucosa is moist.   . Sclera anicteric, conjunctivae is pink. Thyroid not enlarged. No cervical lymphadenopathy. Lungs  clear. Heart regular rate and rhythm.  Abdomen is soft. Bowel sounds are positive. No hepatomegaly. No abdominal masses felt. No tenderness.  No edema to lower extremities.          Assessment & Plan:  Fecal incontinence resolved now. Fiber has helped. She stopped the Stool softener. OV in 1 year.

## 2014-08-24 ENCOUNTER — Encounter (INDEPENDENT_AMBULATORY_CARE_PROVIDER_SITE_OTHER): Payer: Self-pay | Admitting: *Deleted

## 2014-09-02 ENCOUNTER — Emergency Department (HOSPITAL_COMMUNITY): Payer: BC Managed Care – PPO

## 2014-09-02 ENCOUNTER — Observation Stay (HOSPITAL_COMMUNITY)
Admission: EM | Admit: 2014-09-02 | Discharge: 2014-09-03 | Disposition: A | Discharge status: Home / Self Care | Payer: BC Managed Care – PPO | Attending: Internal Medicine | Admitting: Internal Medicine

## 2014-09-02 ENCOUNTER — Encounter (HOSPITAL_COMMUNITY): Payer: Self-pay | Admitting: *Deleted

## 2014-09-02 DIAGNOSIS — R110 Nausea: Secondary | ICD-10-CM | POA: Diagnosis not present

## 2014-09-02 DIAGNOSIS — Z79899 Other long term (current) drug therapy: Secondary | ICD-10-CM | POA: Insufficient documentation

## 2014-09-02 DIAGNOSIS — K449 Diaphragmatic hernia without obstruction or gangrene: Secondary | ICD-10-CM | POA: Diagnosis not present

## 2014-09-02 DIAGNOSIS — R079 Chest pain, unspecified: Secondary | ICD-10-CM | POA: Insufficient documentation

## 2014-09-02 DIAGNOSIS — K219 Gastro-esophageal reflux disease without esophagitis: Secondary | ICD-10-CM | POA: Insufficient documentation

## 2014-09-02 DIAGNOSIS — R42 Dizziness and giddiness: Secondary | ICD-10-CM | POA: Diagnosis present

## 2014-09-02 DIAGNOSIS — Z720 Tobacco use: Secondary | ICD-10-CM | POA: Diagnosis not present

## 2014-09-02 DIAGNOSIS — R0789 Other chest pain: Secondary | ICD-10-CM

## 2014-09-02 DIAGNOSIS — R61 Generalized hyperhidrosis: Secondary | ICD-10-CM | POA: Diagnosis not present

## 2014-09-02 DIAGNOSIS — I379 Nonrheumatic pulmonary valve disorder, unspecified: Secondary | ICD-10-CM

## 2014-09-02 DIAGNOSIS — I10 Essential (primary) hypertension: Secondary | ICD-10-CM | POA: Insufficient documentation

## 2014-09-02 DIAGNOSIS — Z7982 Long term (current) use of aspirin: Secondary | ICD-10-CM | POA: Diagnosis not present

## 2014-09-02 DIAGNOSIS — R9431 Abnormal electrocardiogram [ECG] [EKG]: Secondary | ICD-10-CM

## 2014-09-02 HISTORY — DX: Type 2 diabetes mellitus without complications: E11.9

## 2014-09-02 LAB — BASIC METABOLIC PANEL
Anion gap: 7 (ref 5–15)
BUN: 13 mg/dL (ref 6–23)
CHLORIDE: 108 meq/L (ref 96–112)
CO2: 26 mmol/L (ref 19–32)
Calcium: 9.9 mg/dL (ref 8.4–10.5)
Creatinine, Ser: 0.67 mg/dL (ref 0.50–1.10)
GFR calc Af Amer: >90 mL/min (ref >90)
GFR calc non Af Amer: 90 mL/min — ABNORMAL LOW (ref >90)
Glucose, Bld: 89 mg/dL (ref 70–99)
POTASSIUM: 3.9 mmol/L (ref 3.5–5.1)
Sodium: 141 mmol/L (ref 135–145)

## 2014-09-02 LAB — CBC WITH DIFFERENTIAL/PLATELET
Basophils Absolute: 0 10*3/uL (ref 0.0–0.1)
Basophils Relative: 0 % (ref 0–1)
Eosinophils Absolute: 0 10*3/uL (ref 0.0–0.7)
Eosinophils Relative: 1 % (ref 0–5)
HCT: 42.1 % (ref 36.0–46.0)
HEMOGLOBIN: 13.5 g/dL (ref 12.0–15.0)
LYMPHS ABS: 1.5 10*3/uL (ref 0.7–4.0)
LYMPHS PCT: 25 % (ref 12–46)
MCH: 27.8 pg (ref 26.0–34.0)
MCHC: 32.1 g/dL (ref 30.0–36.0)
MCV: 86.8 fL (ref 78.0–100.0)
MONOS PCT: 9 % (ref 3–12)
Monocytes Absolute: 0.5 10*3/uL (ref 0.1–1.0)
NEUTROS PCT: 65 % (ref 43–77)
Neutro Abs: 4.1 10*3/uL (ref 1.7–7.7)
Platelets: 140 10*3/uL — ABNORMAL LOW (ref 150–400)
RBC: 4.85 MIL/uL (ref 3.87–5.11)
RDW: 12.9 % (ref 11.5–15.5)
WBC: 6.2 10*3/uL (ref 4.0–10.5)

## 2014-09-02 LAB — LIPID PANEL
CHOLESTEROL: 124 mg/dL (ref 0–200)
HDL: 36 mg/dL — ABNORMAL LOW (ref >39)
LDL Cholesterol: 59 mg/dL (ref 0–99)
TRIGLYCERIDES: 144 mg/dL (ref <150)
Total CHOL/HDL Ratio: 3.4 RATIO
VLDL: 29 mg/dL (ref 0–40)

## 2014-09-02 LAB — LACTIC ACID, PLASMA: LACTIC ACID, VENOUS: 1.8 mmol/L (ref 0.5–2.2)

## 2014-09-02 LAB — HEMOGLOBIN A1C
Hgb A1c MFr Bld: 6.6 % — ABNORMAL HIGH
Mean Plasma Glucose: 143 mg/dL — ABNORMAL HIGH

## 2014-09-02 LAB — TROPONIN I
Troponin I: <0.03 ng/mL (ref <0.031)
Troponin I: <0.03 ng/mL (ref <0.031)

## 2014-09-02 LAB — PROTIME-INR
INR: 1.06 (ref 0.00–1.49)
Prothrombin Time: 14 seconds (ref 11.6–15.2)

## 2014-09-02 LAB — MAGNESIUM: MAGNESIUM: 2 mg/dL (ref 1.5–2.5)

## 2014-09-02 LAB — HEPARIN LEVEL (UNFRACTIONATED): Heparin Unfractionated: 0.67 IU/mL (ref 0.30–0.70)

## 2014-09-02 LAB — TSH: TSH: 0.828 u[IU]/mL (ref 0.350–4.500)

## 2014-09-02 LAB — APTT: APTT: 113 s — AB (ref 24–37)

## 2014-09-02 MED ORDER — SODIUM CHLORIDE 0.9 % IV SOLN
INTRAVENOUS | Status: DC
  Administered 2014-09-02: 11:00:00 via INTRAVENOUS

## 2014-09-02 MED ORDER — CYCLOSPORINE 0.05 % OP EMUL
1.0000 [drp] | Freq: Two times a day (BID) | OPHTHALMIC | Status: DC
  Administered 2014-09-02: 1 [drp] via OPHTHALMIC
  Filled 2014-09-02 (×11): qty 1

## 2014-09-02 MED ORDER — ALUM & MAG HYDROXIDE-SIMETH 200-200-20 MG/5ML PO SUSP: 30.0000 mL | Freq: Four times a day (QID) | ORAL | Status: DC | PRN

## 2014-09-02 MED ORDER — HEPARIN (PORCINE) IN NACL 100-0.45 UNIT/ML-% IJ SOLN
750.0000 [IU]/h | INTRAMUSCULAR | Status: DC
Start: 2014-09-02 — End: 2014-09-03
  Administered 2014-09-02: 750 [IU]/h via INTRAVENOUS
  Filled 2014-09-02: qty 250

## 2014-09-02 MED ORDER — ACETAMINOPHEN 650 MG RE SUPP: 650.0000 mg | Freq: Four times a day (QID) | RECTAL | Status: DC | PRN

## 2014-09-02 MED ORDER — AMLODIPINE BESYLATE 5 MG PO TABS
10.0000 mg | ORAL_TABLET | Freq: Every day | ORAL | Status: DC
  Administered 2014-09-03: 10 mg via ORAL
  Filled 2014-09-02 (×2): qty 2

## 2014-09-02 MED ORDER — CALCIUM CARBONATE 600 MG PO TABS: 600.0000 mg | ORAL_TABLET | Freq: Two times a day (BID) | ORAL | Status: DC

## 2014-09-02 MED ORDER — ACETAMINOPHEN 325 MG PO TABS: 650.0000 mg | ORAL_TABLET | Freq: Four times a day (QID) | ORAL | Status: DC | PRN

## 2014-09-02 MED ORDER — DOCUSATE SODIUM 100 MG PO CAPS
100.0000 mg | ORAL_CAPSULE | Freq: Two times a day (BID) | ORAL | Status: DC
  Administered 2014-09-02 – 2014-09-03 (×3): 100 mg via ORAL
  Filled 2014-09-02 (×3): qty 1

## 2014-09-02 MED ORDER — ONDANSETRON HCL 4 MG/2ML IJ SOLN: 4.0000 mg | Freq: Four times a day (QID) | INTRAMUSCULAR | Status: DC | PRN

## 2014-09-02 MED ORDER — MORPHINE SULFATE 2 MG/ML IJ SOLN: 1.0000 mg | INTRAMUSCULAR | Status: DC | PRN

## 2014-09-02 MED ORDER — IBUPROFEN 600 MG PO TABS
600.0000 mg | ORAL_TABLET | Freq: Four times a day (QID) | ORAL | Status: DC | PRN
  Administered 2014-09-02 (×2): 600 mg via ORAL
  Filled 2014-09-02 (×2): qty 1

## 2014-09-02 MED ORDER — BENAZEPRIL HCL 10 MG PO TABS: 20.0000 mg | ORAL_TABLET | Freq: Every day | ORAL | Status: DC

## 2014-09-02 MED ORDER — ENOXAPARIN SODIUM 40 MG/0.4ML ~~LOC~~ SOLN
40.0000 mg | Freq: Every day | SUBCUTANEOUS | Status: DC
  Administered 2014-09-02: 40 mg via SUBCUTANEOUS
  Filled 2014-09-02: qty 0.4

## 2014-09-02 MED ORDER — BISACODYL 10 MG RE SUPP: 10.0000 mg | Freq: Every day | RECTAL | Status: DC | PRN

## 2014-09-02 MED ORDER — TRAZODONE HCL 50 MG PO TABS: 25.0000 mg | ORAL_TABLET | Freq: Every evening | ORAL | Status: DC | PRN

## 2014-09-02 MED ORDER — PANTOPRAZOLE SODIUM 40 MG PO TBEC
40.0000 mg | DELAYED_RELEASE_TABLET | Freq: Every day | ORAL | Status: DC
  Administered 2014-09-02 – 2014-09-03 (×2): 40 mg via ORAL
  Filled 2014-09-02 (×2): qty 1

## 2014-09-02 MED ORDER — HYDROCODONE-ACETAMINOPHEN 5-325 MG PO TABS: 1.0000 | ORAL_TABLET | ORAL | Status: DC | PRN

## 2014-09-02 MED ORDER — MECLIZINE HCL 12.5 MG PO TABS
50.0000 mg | ORAL_TABLET | Freq: Three times a day (TID) | ORAL | Status: DC | PRN
Start: 2014-09-02 — End: 2014-09-03

## 2014-09-02 MED ORDER — NITROGLYCERIN 0.4 MG SL SUBL: 0.4000 mg | SUBLINGUAL_TABLET | SUBLINGUAL | Status: DC | PRN

## 2014-09-02 MED ORDER — CALCIUM CARBONATE 1250 (500 CA) MG PO TABS
1.0000 | ORAL_TABLET | Freq: Two times a day (BID) | ORAL | Status: DC
  Administered 2014-09-02 – 2014-09-03 (×3): 500 mg via ORAL
  Filled 2014-09-02 (×4): qty 1

## 2014-09-02 MED ORDER — ONDANSETRON HCL 4 MG PO TABS: 4.0000 mg | ORAL_TABLET | Freq: Four times a day (QID) | ORAL | Status: DC | PRN

## 2014-09-02 MED ORDER — AMLODIPINE BESY-BENAZEPRIL HCL 10-20 MG PO CAPS: 1.0000 | ORAL_CAPSULE | Freq: Every day | ORAL | Status: DC

## 2014-09-02 MED ORDER — ASPIRIN EC 325 MG PO TBEC
325.0000 mg | DELAYED_RELEASE_TABLET | Freq: Every day | ORAL | Status: DC
  Administered 2014-09-03: 325 mg via ORAL
  Filled 2014-09-02 (×2): qty 1

## 2014-09-02 MED ORDER — ASPIRIN 81 MG PO CHEW
324.0000 mg | CHEWABLE_TABLET | Freq: Once | ORAL | Status: AC
  Administered 2014-09-02: 324 mg via ORAL
  Filled 2014-09-02: qty 4

## 2014-09-02 MED ORDER — BENAZEPRIL HCL 10 MG PO TABS
20.0000 mg | ORAL_TABLET | Freq: Every day | ORAL | Status: DC
  Administered 2014-09-03: 20 mg via ORAL
  Filled 2014-09-02 (×2): qty 2

## 2014-09-02 MED ORDER — HEPARIN BOLUS VIA INFUSION
4000.0000 [IU] | Freq: Once | INTRAVENOUS | Status: AC
  Administered 2014-09-02: 4000 [IU] via INTRAVENOUS
  Filled 2014-09-02: qty 4000

## 2014-09-02 NOTE — ED Notes (Signed)
Pt to department via EMS.  Pt reports that about 3 am she became weak and dizzy about 3 am, "like I was going to pass out".  Pt denies having symptoms at present.  States that she did become nauseated but denies vomiting.  Denies pain at any time.

## 2014-09-02 NOTE — ED Provider Notes (Signed)
CSN: [id number]     Arrival date & time 09/02/14  0435 History   First MD Initiated Contact with Patient 09/02/14 0441     Chief Complaint  Patient presents with  . Rebekah Gutierrez     (Consider location/radiation/quality/duration/timing/severity/associated sxs/prior Treatment) Patient is a 68 y.o. female presenting with Rebekah Gutierrez. The history is provided by the patient.  Rebekah Gutierrez She was at work and she started feeling dizzy like she was going to pass out. There is associated nausea and diaphoresis. She noted some dyspnea and a tight feeling across her upper sternal area. Symptoms lasted about 30 minutes before resolving. She denies any kind of a precipitating event. She's never had anything like this before. She is a cigarette smoker-smokes half pack of cigarettes a day. Also has history of hypertension.  Past Medical History  Diagnosis Date  . Hypertension   . GERD (gastroesophageal reflux disease)   . Hiatal hernia   . Vertigo    Past Surgical History  Procedure Laterality Date  . Back surgery    . Cervical spine surgery      fusion  . Bladder surgery    . Foot surgery      left   . Hand surgery      left  . Tubal ligation    . Colonoscopy  06/20/2012    Procedure: COLONOSCOPY;  Surgeon: Rogene Houston, MD;  Location: [facility];  Service: Endoscopy;  Laterality: N/A;  250   Family History  Problem Relation Age of Onset  . Colon cancer Neg Hx    History  Substance Use Topics  . Smoking status: Current Every Day Smoker -- 0.50 packs/day for 45 years    Types: Cigarettes  . Smokeless tobacco: Never Used     Comment: 1/2 pack a day since age 21  . Alcohol Use: No   OB History    No data available     Review of Systems  Neurological: Positive for Rebekah Gutierrez.  All other systems reviewed and are negative.     Allergies  Review of patient's allergies indicates no known allergies.  Home Medications   Prior to Admission medications   Medication Sig Start Date  End Date Taking? Authorizing Provider  amLODipine-benazepril (LOTREL) 10-20 MG per capsule TAKE 1 CAPSULE DAILY 06/16/14   [name], MD  aspirin 325 MG EC tablet Take 325 mg by mouth daily.    Historical Provider, MD  benazepril (LOTENSIN) 20 MG tablet Take 20 mg by mouth daily.    Historical Provider, MD  calcium carbonate (OS-CAL) 600 MG TABS Take 600 mg by mouth 2 (two) times daily with a meal.    Historical Provider, MD  Cinnamon 500 MG TABS Take by mouth.    Historical Provider, MD  cycloSPORINE (RESTASIS) 0.05 % ophthalmic emulsion Place 1 drop into both eyes 2 (two) times daily. 02/06/13   [name], MD  docusate sodium (COLACE) 100 MG capsule Take 100 mg by mouth 2 (two) times daily.    Historical Provider, MD  ibuprofen (ADVIL,MOTRIN) 200 MG tablet Take 600 mg by mouth every 6 (six) hours as needed for pain.    Historical Provider, MD  meclizine (ANTIVERT) 50 MG tablet Take 1 tablet (50 mg total) by mouth 3 (three) times daily as needed. 05/26/13   Johnna Acosta, MD  Multiple Vitamin (MULTIVITAMIN WITH MINERALS) TABS tablet Take 1 tablet by mouth daily.    Historical Provider, MD  pantoprazole (PROTONIX) 40 MG tablet TAKE  1 TABLET DAILY 06/16/14   [name], MD  potassium chloride SA (K-DUR,KLOR-CON) 20 MEQ tablet TAKE 1 TABLET TWICE A DAY 06/16/14   [name], MD  rOPINIRole (REQUIP) 0.25 MG tablet Take 2 tablets (0.5 mg total) by mouth at bedtime. Take 2 tablets at bedtime 12/11/13 12/11/14  [name], MD   BP 127/71 mmHg  Pulse 64  Temp(Src) 98.1 F (36.7 C) (Oral)  Resp 15  Ht 5' (1.524 m)  Wt 154 lb (69.854 kg)  BMI 30.08 kg/m2  SpO2 100% Physical Exam  Nursing note and vitals reviewed.  68 year old female, resting comfortably and in no acute distress. Vital signs are normal. Oxygen saturation is 100%, which is normal. Head is normocephalic and atraumatic. PERRLA, EOMI. Oropharynx is clear. Neck is nontender and supple without adenopathy or  JVD. Back is nontender and there is no CVA tenderness. Lungs are clear without rales, wheezes, or rhonchi. Chest is nontender. Heart has regular rate and rhythm without murmur. Abdomen is soft, flat, nontender without masses or hepatosplenomegaly and peristalsis is normoactive. Extremities have no cyanosis or edema, full range of motion is present. Skin is warm and dry without rash. Neurologic: Mental status is normal, cranial nerves are intact, there are no motor or sensory deficits.  ED Course  Procedures (including critical care time) Labs Review Results for orders placed or performed during the hospital encounter of 17/79/39  Basic metabolic panel  Result Value Ref Range   Sodium 141 135 - 145 mmol/L   Potassium 3.9 3.5 - 5.1 mmol/L   Chloride 108 96 - 112 mEq/L   CO2 26 19 - 32 mmol/L   Glucose, Bld 89 70 - 99 mg/dL   BUN 13 6 - 23 mg/dL   Creatinine, Ser 0.67 0.50 - 1.10 mg/dL   Calcium 9.9 8.4 - 10.5 mg/dL   GFR calc non Af Amer 90 (L) >90 mL/min   GFR calc Af Amer >90 >90 mL/min   Anion gap 7 5 - 15  Troponin I  Result Value Ref Range   Troponin I <0.03 <0.031 ng/mL  CBC with Differential  Result Value Ref Range   WBC 6.2 4.0 - 10.5 K/uL   RBC 4.85 3.87 - 5.11 MIL/uL   Hemoglobin 13.5 12.0 - 15.0 g/dL   HCT 42.1 36.0 - 46.0 %   MCV 86.8 78.0 - 100.0 fL   MCH 27.8 26.0 - 34.0 pg   MCHC 32.1 30.0 - 36.0 g/dL   RDW 12.9 11.5 - 15.5 %   Platelets 140 (L) 150 - 400 K/uL   Neutrophils Relative % 65 43 - 77 %   Neutro Abs 4.1 1.7 - 7.7 K/uL   Lymphocytes Relative 25 12 - 46 %   Lymphs Abs 1.5 0.7 - 4.0 K/uL   Monocytes Relative 9 3 - 12 %   Monocytes Absolute 0.5 0.1 - 1.0 K/uL   Eosinophils Relative 1 0 - 5 %   Eosinophils Absolute 0.0 0.0 - 0.7 K/uL   Basophils Relative 0 0 - 1 %   Basophils Absolute 0.0 0.0 - 0.1 K/uL  Lactic acid, plasma  Result Value Ref Range   Lactic Acid, Venous 1.8 0.5 - 2.2 mmol/L   Imaging Review Dg Chest 2 View  09/02/2014    CLINICAL DATA:  Difficulty breathing and Rebekah Gutierrez  EXAM: CHEST  2 VIEW  COMPARISON:  November 12, 2009  FINDINGS: There is no edema or consolidation. The heart size and pulmonary  vascularity are within normal limits. There is a small hiatal hernia. No adenopathy. There is focal eventration of the right hemidiaphragm posteromedially. There is postoperative change in the lower cervical spine.  IMPRESSION: No edema or consolidation.   Electronically Signed   By: Lowella Grip M.D.   On: 09/02/2014 07:00     EKG Interpretation   Date/Time:  Wednesday September 02 2014 05:56:12 EST Ventricular Rate:  63 PR Interval:  135 QRS Duration: 91 QT Interval:  468 QTC Calculation: 479 R Axis:   -19 Text Interpretation:  Sinus rhythm Borderline left axis deviation  Nonspecific T abnormalities, lateral leads Minimal ST elevation, anterior  leads When compared with ECG of 11/12/2009, T wave inversion in the   Inferior leads and Anterolateral leads is now Present Confirmed by [facility]   MD, Finnleigh Marchetti ([id number]) on 09/02/2014 6:04:21 AM      MDM   Final diagnoses:  Rebekah Gutierrez    Episode of Rebekah Gutierrez which sounds like a vasovagal episode. ECG and troponin will be obtained to evaluate possible cardiac etiology and chest x-ray will be obtained. Old records are reviewed and she has no relevant past visits. Last chest x-ray on record was in 2011, and last ECG was in 2010.  ECG shows changes that are worrisome for inferolateral ischemia. However, she has been pain-free her entire time in the ED. Initial troponin is normal. She will be observed in the ED and repeat troponin done. She will need to be referred for stress testing if repeat troponin is normal. Case is endorsed to Dr. Winfred Leeds.  Delora Fuel, MD 07/68/08 8110

## 2014-09-02 NOTE — Consult Note (Signed)
CARDIOLOGY CONSULT NOTE   Patient ID: Rebekah Gutierrez MRN: [id number] DOB/AGE: 02/22/1946 68 y.o.  Admit Date: 09/02/2014 Referring Physician: PTH Primary Physician: Vic Blackbird, MD Consulting Cardiologist: Carlyle Dolly MD Primary Cardiologist: New Reason for Consultation: Dyspnea and Dizziness Clinical Summary Rebekah Gutierrez is a 68 y.o.female with no prior cardiac history, hx of hypertension, borderline diabetes, GERD, and chronic back pain. 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 States she had sudden onset of dizziness and diaphoresis with shortness of breath.Sat down and continued to be lightheaded. Went to her locker room to sit down. Co-Workers took her to [employer] where EMS arrived to take her to ER. She denies chest pain but did have chest pressure.  In ER she was found to be normotensive, slightly bradycardic, O2 Sat 100%. Labs normal EKG with T-wave inversion in the inferio/lateral leads. CXR negative for pneumonia or CHF. She was treated with ASA. U/A is not completed yet.  No Known Allergies  Medications Scheduled Medications: . amLODipine  10 mg Oral Daily   And  . benazepril  20 mg Oral Daily  . [START ON 09/03/2014] aspirin  325 mg Oral Daily  . calcium carbonate  1 tablet Oral BID WC  . cycloSPORINE  1 drop Both Eyes BID  . docusate sodium  100 mg Oral BID  . enoxaparin (LOVENOX) injection  40 mg Subcutaneous Daily  . pantoprazole  40 mg Oral Daily    Infusions: . sodium chloride 10 mL/hr at 09/02/14 1127    PRN Medications: acetaminophen **OR** acetaminophen, alum & mag hydroxide-simeth, bisacodyl, HYDROcodone-acetaminophen, ibuprofen, meclizine, morphine injection, nitroGLYCERIN, ondansetron **OR** ondansetron (ZOFRAN) IV, traZODone   Past Medical History  Diagnosis Date  . Hypertension   . GERD (gastroesophageal reflux disease)   . Hiatal hernia   . Vertigo   . Diabetes      Past Surgical History  Procedure Laterality Date  . Back surgery    . Cervical spine surgery      fusion  . Bladder surgery    . Foot surgery      left   . Hand surgery      left  . Tubal ligation    . Colonoscopy  06/20/2012    Procedure: COLONOSCOPY;  Surgeon: Rogene Houston, MD;  Location: [facility];  Service: Endoscopy;  Laterality: N/A;  250    Family History  Problem Relation Age of Onset  . Colon cancer Neg Hx   . Hypertension Mother   . Hypertension Father   . Cancer Father   . Stroke Mother     Social History Rebekah Gutierrez reports that she has been smoking Cigarettes.  She has a 22.5 pack-year smoking history. She has never used smokeless tobacco. Rebekah Gutierrez reports that she does not drink alcohol.  Review of Systems Complete review of systems are found to be negative unless outlined in H&P above.  Physical Examination Blood pressure 139/81, pulse 62, temperature 97.8 F (36.6 C), temperature source Oral, resp. rate 18, height 5' (1.524 m), weight 153 lb (69.4 kg), SpO2 100 %. No intake or output data in the 24 hours ending 09/02/14 1253  Telemetry: NSR  WSF:KCLEXNT, no acute distress HEENT: Conjunctiva and lids normal, oropharynx clear with moist mucosa. Neck: Supple, no elevated JVP or carotid bruits, no thyromegaly. Lungs: Clear to auscultation, nonlabored breathing at rest. Cardiac: Regular rate and rhythm, no S3 or  significant systolic murmur, no pericardial rub. Abdomen: Soft, nontender, no hepatomegaly, bowel sounds present, no guarding or rebound. Extremities: No pitting edema, distal pulses 2+. Skin: Warm and dry. Musculoskeletal: No kyphosis. Neuropsychiatric: Alert and oriented x3, affect grossly appropriate.  Prior Cardiac Testing/Procedures 1.None  Lab Results  Basic Metabolic Panel:  Recent Labs Lab 09/02/14 0547 09/02/14 0954  NA 141  --   K 3.9  --   CL 108  --   CO2 26  --   GLUCOSE 89  --   BUN 13  --   CREATININE 0.67   --   CALCIUM 9.9  --   MG  --  2.0    CBC:  Recent Labs Lab 09/02/14 0547  WBC 6.2  NEUTROABS 4.1  HGB 13.5  HCT 42.1  MCV 86.8  PLT 140*    Cardiac Enzymes:  Recent Labs Lab 09/02/14 0547 09/02/14 0945  TROPONINI <0.03 <0.03     Radiology: Dg Chest 2 View  09/02/2014   CLINICAL DATA:  Difficulty breathing and dizziness  EXAM: CHEST  2 VIEW  COMPARISON:  November 12, 2009  FINDINGS: There is no edema or consolidation. The heart size and pulmonary vascularity are within normal limits. There is a small hiatal hernia. No adenopathy. There is focal eventration of the right hemidiaphragm posteromedially. There is postoperative change in the lower cervical spine.  IMPRESSION: No edema or consolidation.   Electronically Signed   By: Lowella Grip M.D.   On: 09/02/2014 07:00     ECG: NSR with T-wave inversion in the infero/lateral leads.   Impression and Recommendations  1. Dizziness/Chest tightness: Recommend orthostatic BP.Check UA. No cardiac arrhythmia's seen on telemetry or EKG.Tropionin x1 is negative. The review of other labs does have appearance of dehydration. Creatinine 0.70. She was working in hot environment, but states that it is normal for her to do this.Vagal episode cannot be ruled out.  She denies racing heart rate or prior episodes of this. Echo is pending for structural abnormalities.   2. Hypertension: Longstanding.On amlodipine with benazepril at home. With age and long periods of standing, may need to decrease medications or change to single antihypertensive medication. Orthostatic's should be completed.    Signed: Phill Myron. Lawrence NP [place]  09/02/2014, 12:53 PM Co-Sign MD Patient seen and discussed with NP Purcell Nails, I agree with her documentation above. 68 yo feamle admitted with SOB, dizziness, and chest tightness. Episode occurred while standing at work 330AM this morning. Initially felt very lightheaded, went to sit down and then became very  diaphoretic. She later developed a 6/10 chest tightness in midchest with SOB that lasted approx 20 minutes. Never had similar pain before.   Hgb 13.5 Plt 140 Trop neg x2 K 3.9 Cr 0.67 CXR no acute process EKG SR, TWI and ST depressions inferior limb and lateral precordial leads.  Her 2010 EKG has some T-wave flattening in inferior limb leads and mild depressions lateral precordial leads CAD risk factors: age, DM, HTN  Symptoms concerning for possible angina. She has CAD risk factors and EKG changes suggestive of possible ischemia, enzymes so far negative. If true unstable angina her TIMI score is a 3 indicating high risk, will start heparin gtt as we complete her workup. F/u echo and further cycling of enzymes and EKGs, likely plan for nuclear stress tomorrow however if enzymes bump or abnormality on echo would consider cath.   Zandra Abts MD

## 2014-09-02 NOTE — Progress Notes (Signed)
ANTICOAGULATION CONSULT NOTE - Initial Consult  Pharmacy Consult for Heparin Indication: chest pain/ACS  No Known Allergies  Patient Measurements: Height: 5' (152.4 cm) Weight: 153 lb (69.4 kg) IBW/kg (Calculated) : 45.5 Heparin Dosing Weight: 59Kg  Vital Signs: Temp: 97.8 F (36.6 C) (12/23 1022) Temp Source: Oral (12/23 1022) BP: 139/81 mmHg (12/23 1022) Pulse Rate: 62 (12/23 1022)  Labs:  Recent Labs  09/02/14 0547 09/02/14 0945  HGB 13.5  --   HCT 42.1  --   PLT 140*  --   CREATININE 0.67  --   TROPONINI <0.03 <0.03   Estimated Creatinine Clearance: 60.2 mL/min (by C-G formula based on Cr of 0.67).  Medical History: Past Medical History  Diagnosis Date  . Hypertension   . GERD (gastroesophageal reflux disease)   . Hiatal hernia   . Vertigo   . Diabetes    Assessment: 68yo female admitted with SOB and chest tightness.  Symptoms concerning for possible angina. She has CAD risk factors and EKG changes suggestive of possible ischemia, enzymes so far negative. If true unstable angina her TIMI score is a 3 indicating high risk, will start heparin gtt as we complete her workup. F/u echo and further cycling of enzymes and EKGs, likely plan for nuclear stress tomorrow however if enzymes bump or abnormality on echo would consider cath.   Goal of Therapy:  Achieve Heparin level 0.3-0.7 units/ml within 24hrs of Heparin initiation Monitor platelets by anticoagulation protocol: Yes   Plan:   Heparin 4000 units IV now x 1  Heparin infusion at 12 units/Kg/Hr using ADJ BW  Check Heparin level in 6-8 hrs then daily while on Heparin  CBC daily while on Heparin  [place] Crane, Darry Kelnhofer A 09/02/2014,2:05 PM

## 2014-09-02 NOTE — Progress Notes (Signed)
  Echocardiogram 2D Echocardiogram has been performed.  [place], [place] 09/02/2014, 3:18 PM

## 2014-09-02 NOTE — Progress Notes (Signed)
ANTICOAGULATION CONSULT NOTE - follow up  Pharmacy Consult for Heparin Indication: chest pain/ACS  No Known Allergies  Patient Measurements: Height: 5' (152.4 cm) Weight: 153 lb (69.4 kg) IBW/kg (Calculated) : 45.5 Heparin Dosing Weight: 59Kg  Vital Signs: Temp: 98.3 F (36.8 C) (12/23 1909) Temp Source: Oral (12/23 1909) BP: 137/71 mmHg (12/23 1909) Pulse Rate: 76 (12/23 1909)  Labs:  Recent Labs  09/02/14 0547 09/02/14 0945 09/02/14 1749 09/02/14 2005  HGB 13.5  --   --   --   HCT 42.1  --   --   --   PLT 140*  --   --   --   APTT  --   --  113*  --   LABPROT  --   --  14.0  --   INR  --   --  1.06  --   HEPARINUNFRC  --   --   --  0.67  CREATININE 0.67  --   --   --   TROPONINI <0.03 <0.03 <0.03  --    Estimated Creatinine Clearance: 60.2 mL/min (by C-G formula based on Cr of 0.67).  Medical History: Past Medical History  Diagnosis Date  . Hypertension   . GERD (gastroesophageal reflux disease)   . Hiatal hernia   . Vertigo   . Diabetes    Assessment: 68yo female admitted with SOB and chest tightness.  Symptoms concerning for possible angina. She has CAD risk factors and EKG changes suggestive of possible ischemia, enzymes so far negative. If true unstable angina her TIMI score is a 3 indicating high risk, will start heparin gtt as we complete her workup. F/u echo and further cycling of enzymes and EKGs, likely plan for nuclear stress tomorrow however if enzymes bump or abnormality on echo would consider cath.   Goal of Therapy:  Achieve Heparin level 0.3-0.7 units/ml within 24hrs of Heparin initiation Monitor platelets by anticoagulation protocol: Yes   Plan:   Continue Heparin at 750 units/hr  Heparin level daily  CBC daily while on Heparin  [place] Crane, Davida Falconi A 09/02/2014,9:17 PM

## 2014-09-02 NOTE — ED Provider Notes (Signed)
Patient became lightheaded while at work 3:30 AM today accompanied by shortness of breath. Episode lasted possibly one hour. She denies any chest discomfort. Also became nauseated for less than 5 minutes no sweatiness no other symptoms. Presently asymptomatic. No treatment prior to coming here. On exam alert Glasgow Coma Score 15 no distress lungs clear to auscultation heart regular rate and rhythm no murmurs abdomen nontender extremities without edema skin warm dry Heart score equals 5-6 based on risk factors, hx , ekg changes.  Spoke with Dr, branch from cardiology service. He recommends inpatient stay with hospitalist. He will see patient later today  Orlie Dakin, MD 09/02/14 1601

## 2014-09-02 NOTE — H&P (Signed)
[facility] History and Physical  Rebekah Gutierrez DOB: September 06, 1946 DOA: 09/02/2014  Referring physician:  PCP: Vic Blackbird, MD   Chief Complaint: dizziness/chest pain/sob  HPI: Rebekah Gutierrez is a very pleasant  68 y.o. female with past medical history that includes diabetes, GERD, vertigo presents to the emergency department with chief complaint of sudden onset of dizziness associated with diaphoresis chest pain and shortness of breath. Initial evaluation in the emergency room yields a negative troponin and EKG was nonspecific T-wave abnormalities.  Patient reports she is at work this morning and developed sudden dizziness/lightheadedness. She said she sat down and it did not go away so she went to the locker room. She then developed diaphoresis and mild nausea without vomiting so she called her coworkers who accompanied her to the lounge where she was able to lie down. She reports at this time the diaphoresis improved but she continued with shortness of breath and developed a left anterior chest pain that she describes as a pressure. Was transported to the emergency department via EMS. The time she got to the emergency department symptoms had mostly resolved. He denies any radiation of this pain any headache visual disturbances numbness tingling of extremities. She denies any palpitation. Cough orthopnea, abdominal pain, she denies any recent dysuria hematuria frequency urgency constipation or melena. Workup in the emergency room includes complete metabolic panel that was unremarkable and initial troponin is negative lactic acid of 1.8 complete blood count unremarkable, chest x-ray with no edema or consolidation.  At the time of my exam she is hemodynamically stable afebrile not hypoxic and symptoms have resolved.   Review of Systems:  10 point review of systems complete and all systems are negative except as indicated in the history of present illness Past Medical History    Diagnosis Date  . Hypertension   . GERD (gastroesophageal reflux disease)   . Hiatal hernia   . Vertigo   . Diabetes    Past Surgical History  Procedure Laterality Date  . Back surgery    . Cervical spine surgery      fusion  . Bladder surgery    . Foot surgery      left   . Hand surgery      left  . Tubal ligation    . Colonoscopy  06/20/2012    Procedure: COLONOSCOPY;  Surgeon: Rogene Houston, MD;  Location: [facility];  Service: Endoscopy;  Laterality: N/A;  250   Social History:  reports that she has been smoking Cigarettes.  She has a 22.5 pack-year smoking history. She has never used smokeless tobacco. She reports that she does not drink alcohol or use illicit drugs. He lives alone she works 12 hour shifts from 3 AM to 3 PM No Known Allergies  Family History  Problem Relation Age of Onset  . Colon cancer Neg Hx      Prior to Admission medications   Medication Sig Start Date End Date Taking? Authorizing Provider  amLODipine-benazepril (LOTREL) 10-20 MG per capsule TAKE 1 CAPSULE DAILY 06/16/14  Yes [name], MD  aspirin 325 MG EC tablet Take 325 mg by mouth daily.   Yes Historical Provider, MD  calcium carbonate (OS-CAL) 600 MG TABS Take 600 mg by mouth 2 (two) times daily with a meal.   Yes Historical Provider, MD  Cinnamon 500 MG TABS Take 1 tablet by mouth at bedtime.    Yes Historical Provider, MD  cycloSPORINE (RESTASIS) 0.05 % ophthalmic  emulsion Place 1 drop into both eyes 2 (two) times daily. 02/06/13  Yes [name], MD  ibuprofen (ADVIL,MOTRIN) 200 MG tablet Take 600 mg by mouth every 6 (six) hours as needed for pain.   Yes Historical Provider, MD  Multiple Vitamin (MULTIVITAMIN WITH MINERALS) TABS tablet Take 1 tablet by mouth daily.   Yes Historical Provider, MD  pantoprazole (PROTONIX) 40 MG tablet TAKE 1 TABLET DAILY 06/16/14  Yes [name], MD  polycarbophil (FIBERCON) 625 MG tablet Take 625 mg by mouth daily.   Yes Historical  Provider, MD  potassium chloride SA (K-DUR,KLOR-CON) 20 MEQ tablet TAKE 1 TABLET TWICE A DAY 06/16/14  Yes [name], MD   Physical Exam: Filed Vitals:   09/02/14 0800 09/02/14 0830 09/02/14 0832 09/02/14 1022  BP: 129/89 133/84 133/74 139/81  Pulse: 70 79 79 62  Temp:    97.8 F (36.6 C)  TempSrc:    Oral  Resp: 18 25 18 18   Height:    5' (1.524 m)  Weight:    69.4 kg (153 lb)  SpO2: 100% 100% 100% 100%    Wt Readings from Last 3 Encounters:  09/02/14 69.4 kg (153 lb)  07/29/14 69.627 kg (153 lb 8 oz)  06/17/14 67.268 kg (148 lb 4.8 oz)    General:  Appears calm and comfortable Eyes: PERRL, normal lids, irises & conjunctiva ENT: grossly normal hearing, lips & tongue Neck: no LAD, masses or thyromegaly Cardiovascular: RRR, no m/r/g. No LE edema. Respiratory: CTA bilaterally, no w/r/r. Normal respiratory effort. Abdomen: soft, ntnd Skin: no rash or induration seen on limited exam Musculoskeletal: grossly normal tone BUE/BLE Psychiatric: grossly normal mood and affect, speech fluent and appropriate Neurologic: grossly non-focal.          Labs on Admission:  Basic Metabolic Panel:  Recent Labs Lab 09/02/14 0547 09/02/14 0954  NA 141  --   K 3.9  --   CL 108  --   CO2 26  --   GLUCOSE 89  --   BUN 13  --   CREATININE 0.67  --   CALCIUM 9.9  --   MG  --  2.0   Liver Function Tests: No results for input(s): AST, ALT, ALKPHOS, BILITOT, PROT, ALBUMIN in the last 168 hours. No results for input(s): LIPASE, AMYLASE in the last 168 hours. No results for input(s): AMMONIA in the last 168 hours. CBC:  Recent Labs Lab 09/02/14 0547  WBC 6.2  NEUTROABS 4.1  HGB 13.5  HCT 42.1  MCV 86.8  PLT 140*   Cardiac Enzymes:  Recent Labs Lab 09/02/14 0547 09/02/14 0945  TROPONINI <0.03 <0.03    BNP (last 3 results) No results for input(s): PROBNP in the last 8760 hours. CBG: No results for input(s): GLUCAP in the last 168 hours.  Radiological Exams on  Admission: Dg Chest 2 View  09/02/2014   CLINICAL DATA:  Difficulty breathing and dizziness  EXAM: CHEST  2 VIEW  COMPARISON:  November 12, 2009  FINDINGS: There is no edema or consolidation. The heart size and pulmonary vascularity are within normal limits. There is a small hiatal hernia. No adenopathy. There is focal eventration of the right hemidiaphragm posteromedially. There is postoperative change in the lower cervical spine.  IMPRESSION: No edema or consolidation.   Electronically Signed   By: Lowella Grip M.D.   On: 09/02/2014 07:00    EKG: Independently reviewed sinus rhythm with nonspecific T wave abnormalities  Assessment/Plan Principal  Problem:   Pain in the chest; prospectus include HTN, borderline diabetes family history. Will admit to telemetry for rule out. We'll cycle cardiac enzymes and get serial EKGs. provide supportive therapy in the form of pain medicine and antiemetic as needed. Request cardiology consult. Likely need stress test.  Active Problems:   Essential hypertension, benign; fair control. Home medications include amlodipine, benazepril. Will continue these and monitor.    Nonspecific abnormal electrocardiogram (ECG) (EKG); see #1. Await cardiology recommendations. Likely benefit from stress test.    Diabetes: Patient reports "borderline". Diet controlled. Will obtain a hemoglobin A1c and provide car modified heart healthy diet monitor daily CBG. Of note serum glucose on admission 47  Dr Harl Bowie cardiology  Code Status: full DVT Prophylaxis: Family Communication: sister at bedside Disposition Plan: home when ready hopefully in am (indicate anticipated LOS)  Time spent: 36 minutes  [facility] Pager [phone]

## 2014-09-03 ENCOUNTER — Observation Stay (HOSPITAL_COMMUNITY): Payer: BC Managed Care – PPO

## 2014-09-03 ENCOUNTER — Encounter (HOSPITAL_COMMUNITY): Payer: Self-pay

## 2014-09-03 DIAGNOSIS — E860 Dehydration: Secondary | ICD-10-CM

## 2014-09-03 DIAGNOSIS — K219 Gastro-esophageal reflux disease without esophagitis: Secondary | ICD-10-CM | POA: Diagnosis not present

## 2014-09-03 DIAGNOSIS — I10 Essential (primary) hypertension: Secondary | ICD-10-CM | POA: Diagnosis not present

## 2014-09-03 DIAGNOSIS — R42 Dizziness and giddiness: Secondary | ICD-10-CM | POA: Diagnosis not present

## 2014-09-03 DIAGNOSIS — R079 Chest pain, unspecified: Secondary | ICD-10-CM | POA: Diagnosis not present

## 2014-09-03 LAB — CBC
HCT: 39.6 % (ref 36.0–46.0)
Hemoglobin: 12.7 g/dL (ref 12.0–15.0)
MCH: 28.1 pg (ref 26.0–34.0)
MCHC: 32.1 g/dL (ref 30.0–36.0)
MCV: 87.6 fL (ref 78.0–100.0)
PLATELETS: 275 10*3/uL (ref 150–400)
RBC: 4.52 MIL/uL (ref 3.87–5.11)
RDW: 13.1 % (ref 11.5–15.5)
WBC: 5.4 10*3/uL (ref 4.0–10.5)

## 2014-09-03 LAB — URINALYSIS, ROUTINE W REFLEX MICROSCOPIC
Bilirubin Urine: NEGATIVE
Glucose, UA: NEGATIVE mg/dL
Ketones, ur: NEGATIVE mg/dL
LEUKOCYTES UA: NEGATIVE
NITRITE: NEGATIVE
Protein, ur: NEGATIVE mg/dL
SPECIFIC GRAVITY, URINE: 1.01 (ref 1.005–1.030)
Urobilinogen, UA: 0.2 mg/dL (ref 0.0–1.0)
pH: 6.5 (ref 5.0–8.0)

## 2014-09-03 LAB — URINE MICROSCOPIC-ADD ON

## 2014-09-03 LAB — HEPARIN LEVEL (UNFRACTIONATED): HEPARIN UNFRACTIONATED: 0.42 [IU]/mL (ref 0.30–0.70)

## 2014-09-03 MED ORDER — TECHNETIUM TC 99M SESTAMIBI - CARDIOLITE
30.0000 | Freq: Once | INTRAVENOUS | Status: AC | PRN
  Administered 2014-09-03: 10:00:00 30 via INTRAVENOUS

## 2014-09-03 MED ORDER — SODIUM CHLORIDE 0.9 % IJ SOLN
INTRAMUSCULAR | Status: AC
  Administered 2014-09-03: 10 mL via INTRAVENOUS
  Filled 2014-09-03: qty 10

## 2014-09-03 MED ORDER — SODIUM CHLORIDE 0.9 % IJ SOLN
10.0000 mL | INTRAMUSCULAR | Status: DC | PRN
  Administered 2014-09-03: 10 mL via INTRAVENOUS
  Filled 2014-09-03: qty 10

## 2014-09-03 MED ORDER — TECHNETIUM TC 99M SESTAMIBI GENERIC - CARDIOLITE
10.0000 | Freq: Once | INTRAVENOUS | Status: AC | PRN
  Administered 2014-09-03: 10 via INTRAVENOUS

## 2014-09-03 MED ORDER — REGADENOSON 0.4 MG/5ML IV SOLN
INTRAVENOUS | Status: AC
  Filled 2014-09-03: qty 5

## 2014-09-03 NOTE — Progress Notes (Signed)
Stress Lab Nurses Notes - [place] [name] 09/03/2014 Reason for doing test: Dyspnea Type of test: Stress Cardiolite / in patient Rm 301 Nurse performing test: Gerrit Halls, RN Nuclear Medicine Tech: Dyanne Carrel Echo Tech: Not Applicable MD performing test: Branch/K.Purcell Nails NP Family MD: [facility] explained and consent signed: Yes.   IV started: No redness or edema and Saline lock from floor Symptoms: Legs fatigue & SOB Treatment/Intervention: None Reason test stopped: fatigue and reached target HR After recovery IV was: No redness or edema and Saline Lock flushed Patient to return to [place]. Med at : 10:30 Patient discharged: Transported back to room 301 via wc Patient's Condition upon discharge was: stable Comments: During test peak BP 209/99 & HR 130.  Recovery BP 139/89 & HR 93 .  Symptoms resolved in recovery. Geanie Cooley T

## 2014-09-03 NOTE — Care Management Note (Signed)
    Page 1 of 1   09/03/2014     11:56:36 AM CARE MANAGEMENT NOTE 09/03/2014  Patient:  Rebekah Gutierrez,Rebekah B.   Account Number:  [id number]  Date Initiated:  09/03/2014  Documentation initiated by:  CHILDRESS,JESSICA  Subjective/Objective Assessment:   Pt is from home admitted with chest pain. Pt independent at home with no HH services, DME's or med needs prior to admission. Pt will discharge home with self care. No CM needs.     Action/Plan:   Anticipated DC Date:  09/03/2014   Anticipated DC Plan:  [place]  CM consult      Choice offered to / List presented to:             Status of service:  Completed, signed off Medicare Important Message given?   (If response is "NO", the following Medicare IM given date fields will be blank) Date Medicare IM given:   Medicare IM given by:   Date Additional Medicare IM given:   Additional Medicare IM given by:    Discharge Disposition:  HOME/SELF CARE  Per UR Regulation:    If discussed at Long Length of Stay Meetings, dates discussed:    Comments:  09/03/2014 [place], RN, MSN, [facility]

## 2014-09-03 NOTE — Progress Notes (Signed)
Patient discharged with instructions, prescription, and care notes.  Verbalized understanding via teach back.  IV was removed and the site was WNL. Patient voiced no further complaints or concerns at the time of discharge.  Appointments scheduled per instructions.  Patient left the floor via ambulation with staff and family in stable condition. 

## 2014-09-03 NOTE — Progress Notes (Signed)
UR completed 

## 2014-09-03 NOTE — Discharge Summary (Signed)
Physician Discharge Summary  Rebekah Gutierrez. [name] JHE:[id number] DOB: December 15, 1945 DOA: 09/02/2014  PCP: Vic Blackbird, MD  Admit date: 09/02/2014 Discharge date: 09/03/2014  Time spent: 40 minutes  Recommendations for Outpatient Follow-up:  1. Follow-up with cardiology in 3-4 weeks  Discharge Diagnoses:  Principal Problem:   Pain in the chest Active Problems:   Essential hypertension, benign   Nonspecific abnormal electrocardiogram (ECG) (EKG)   Chest pain at rest   Dizziness  presyncope Dehydration  Discharge Condition: Improved  Diet recommendation: Low-salt  Filed Weights   09/02/14 0438 09/02/14 1022 09/03/14 0601  Weight: 69.854 kg (154 lb) 69.4 kg (153 lb) 65.318 kg (144 lb)    History of present illness:  This patient was admitted with transient dizziness, lightheadedness, shortness of breath and nausea. She was brought to the ER where she was found to have nonspecific T-wave abnormalities. It was unclear if these EKG changes were new, since last EKG available is from 2010. She was admitted to the hospital for further evaluation.  Hospital Course:  Patient was monitored on telemetry. She ruled out for ACS with negative cardiac markers and did not have acute EKG changes. Echocardiogram was sent which showed a normal ejection fraction. He did also indicate a small IVC which may relate to hypovolemia and dehydration. This could explain her above symptoms. She also underwent stress test which did not show any signs of reversible ischemia. The patient is feeling significantly improved. She was recommended to follow-up with cardiology in 3-4 weeks. She is otherwise been cleared for discharge home.  Procedures:  Echo:- Left ventricle: The cavity size was normal. Wall thickness was increased in a pattern of mild LVH. Systolic function was normal. The estimated ejection fraction was in the range of 55% to 60%. Diastolic function is abnormal, indeterminate grade. Wall  motion was normal; there were no regional wall motion abnormalities. - Aortic valve: Mildly calcified annulus. Trileaflet; normal thickness leaflets. - Mitral valve: Mildly calcified annulus. Mildly thickened leaflets . - Left atrium: The atrium was mildly dilated. - Atrial septum: No defect or patent foramen ovale was identified. - Pulmonary arteries: PA peak pressure: 31 mm Hg (S). PASP is borderline elevated. - Systemic veins: IVC is small,suggestive of low RA pressure and hypovolemia.  Consultations:  Cardiology  Discharge Exam: Filed Vitals:   09/03/14 1337  BP: 135/68  Pulse: 63  Temp: 98.5 F (36.9 C)  Resp: 18    General: No acute distress Cardiovascular: S1, S2, regular rate and rhythm Respiratory: Clear to auscultation bilaterally  Discharge Instructions   Discharge Instructions    Call MD for:  extreme fatigue    Complete by:  As directed      Call MD for:  persistant dizziness or light-headedness    Complete by:  As directed      Call MD for:  severe uncontrolled pain    Complete by:  As directed      Diet - low sodium heart healthy    Complete by:  As directed      Increase activity slowly    Complete by:  As directed           Current Discharge Medication List    CONTINUE these medications which have NOT CHANGED   Details  amLODipine-benazepril (LOTREL) 10-20 MG per capsule TAKE 1 CAPSULE DAILY Qty: 90 capsule, Refills: 1    aspirin 325 MG EC tablet Take 325 mg by mouth daily.    calcium carbonate (OS-CAL) 600 MG TABS Take  600 mg by mouth 2 (two) times daily with a meal.    Cinnamon 500 MG TABS Take 1 tablet by mouth at bedtime.     cycloSPORINE (RESTASIS) 0.05 % ophthalmic emulsion Place 1 drop into both eyes 2 (two) times daily. Qty: 0.4 each, Refills: 1    ibuprofen (ADVIL,MOTRIN) 200 MG tablet Take 600 mg by mouth every 6 (six) hours as needed for pain.    Multiple Vitamin (MULTIVITAMIN WITH MINERALS) TABS tablet Take 1  tablet by mouth daily.    pantoprazole (PROTONIX) 40 MG tablet TAKE 1 TABLET DAILY Qty: 90 tablet, Refills: 1    polycarbophil (FIBERCON) 625 MG tablet Take 625 mg by mouth daily.    potassium chloride SA (K-DUR,KLOR-CON) 20 MEQ tablet TAKE 1 TABLET TWICE A DAY Qty: 180 tablet, Refills: 1      STOP taking these medications     docusate sodium (COLACE) 100 MG capsule      meclizine (ANTIVERT) 50 MG tablet        No Known Allergies    The results of significant diagnostics from this hospitalization (including imaging, microbiology, ancillary and laboratory) are listed below for reference.    Significant Diagnostic Studies: Dg Chest 2 View  09/02/2014   CLINICAL DATA:  Difficulty breathing and dizziness  EXAM: CHEST  2 VIEW  COMPARISON:  November 12, 2009  FINDINGS: There is no edema or consolidation. The heart size and pulmonary vascularity are within normal limits. There is a small hiatal hernia. No adenopathy. There is focal eventration of the right hemidiaphragm posteromedially. There is postoperative change in the lower cervical spine.  IMPRESSION: No edema or consolidation.   Electronically Signed   By: Lowella Grip M.D.   On: 09/02/2014 07:00   Nm Myocar Multi W/spect W/wall Motion / Ef  09/03/2014   CLINICAL DATA:  68-YEAR-OLD FEMALE WITH NO KNOWN HISTORY OF CORONARY ARTERY DISEASE REFERRED FOR CHEST PAIN.  EXAM: MYOCARDIAL IMAGING WITH SPECT (REST AND EXERCISE)  GATED LEFT VENTRICULAR WALL MOTION STUDY  LEFT VENTRICULAR EJECTION FRACTION  TECHNIQUE: Standard myocardial SPECT imaging was performed after resting intravenous injection of 10 mCi Tc-88m sestamibi. Subsequently, exercise tolerance test was performed by the patient under the supervision of the Cardiology staff. At peak-stress, 30 mCi Tc-63m sestamibi was injected intravenously and standard myocardial SPECT imaging was performed. Quantitative gated imaging was also performed to evaluate left ventricular wall motion,  and estimate left ventricular ejection fraction.  COMPARISON:  None.  FINDINGS: EXERCISE STRESS  Baseline EKG showed sinus rhythm with nonspecific inferior and lateral ST/T changes. The patient was exercised according to the Bruce protocol for 6 min and 39 seconds, achieving a work level of 7.00 Mets. The resting heart rate is 65 beats per min rose to a maximal rate of 137 beats per min, representing 88% of the maximal age predicted heart rate. The resting blood pressure of 141/89 rose to a maximum of 2 9/99. The test was stopped due to fatigue, the patient did not experience any chest pain. Stress EKG showed baseline ST/T changes with no new specific ischemic changes and no significant arrhythmias.  Perfusion: There is a small mild intensity inferior wall defect seen in the resting images. A similar less less intense inferior defect is seen in the post stress images. The inferior wall has normal wall motion. Finding is most consistent with sub- diaphragmatic attenuation.  Wall Motion: Normal left ventricular wall motion. No left ventricular dilation.  Left Ventricular Ejection Fraction: 37 %  End diastolic volume 66 ml  End systolic volume 42 ml  IMPRESSION: 1. No reversible ischemia or infarction.  2. Normal left ventricular wall motion.  3. Left ventricular ejection fraction 37%. Wall motion looks fairly normal, low ejection fraction may be due to processing error.  4. Intermediate-risk stress test findings*. Intermediate risk due to calculated low ejection fraction. Wall motion looks fairly normal, low ejection fraction may be due to processing error. There is no evidence of ischemia or prior infarct. Recommend correlation of ejection fraction with echocardiogram.  *2012 Appropriate Use Criteria for Coronary Revascularization Focused Update: J Am Coll Cardiol. 7893;81(0):175-102. http://content.airportbarriers.com.aspx?articleid=1201161   Electronically Signed   By: Carlyle Dolly   On: 09/03/2014 12:00     Microbiology: No results found for this or any previous visit (from the past 240 hour(s)).   Labs: Basic Metabolic Panel:  Recent Labs Lab 09/02/14 0547 09/02/14 0954  NA 141  --   K 3.9  --   CL 108  --   CO2 26  --   GLUCOSE 89  --   BUN 13  --   CREATININE 0.67  --   CALCIUM 9.9  --   MG  --  2.0   Liver Function Tests: No results for input(s): AST, ALT, ALKPHOS, BILITOT, PROT, ALBUMIN in the last 168 hours. No results for input(s): LIPASE, AMYLASE in the last 168 hours. No results for input(s): AMMONIA in the last 168 hours. CBC:  Recent Labs Lab 09/02/14 0547 09/03/14 0515  WBC 6.2 5.4  NEUTROABS 4.1  --   HGB 13.5 12.7  HCT 42.1 39.6  MCV 86.8 87.6  PLT 140* 275   Cardiac Enzymes:  Recent Labs Lab 09/02/14 0547 09/02/14 0945 09/02/14 1749  TROPONINI <0.03 <0.03 <0.03   BNP: BNP (last 3 results) No results for input(s): PROBNP in the last 8760 hours. CBG: No results for input(s): GLUCAP in the last 168 hours.     Signed:  Daxton Nydam  [facility] 09/03/2014, 7:57 PM

## 2014-09-03 NOTE — Progress Notes (Signed)
ANTICOAGULATION CONSULT NOTE - follow up  Pharmacy Consult for Heparin Indication: chest pain/ACS  No Known Allergies  Patient Measurements: Height: 5' (152.4 cm) Weight: 144 lb (65.318 kg) IBW/kg (Calculated) : 45.5 Heparin Dosing Weight: 59Kg  Vital Signs: Temp: 98.7 F (37.1 C) (12/24 0601) Temp Source: Oral (12/24 0601) BP: 130/67 mmHg (12/24 0601) Pulse Rate: 63 (12/24 0601)  Labs:  Recent Labs  09/02/14 0547 09/02/14 0945 09/02/14 1749 09/02/14 2005 09/03/14 0515  HGB 13.5  --   --   --  12.7  HCT 42.1  --   --   --  39.6  PLT 140*  --   --   --  275  APTT  --   --  113*  --   --   LABPROT  --   --  14.0  --   --   INR  --   --  1.06  --   --   HEPARINUNFRC  --   --   --  0.67 0.42  CREATININE 0.67  --   --   --   --   TROPONINI <0.03 <0.03 <0.03  --   --    Estimated Creatinine Clearance: 58.3 mL/min (by C-G formula based on Cr of 0.67).  Medical History: Past Medical History  Diagnosis Date  . Hypertension   . GERD (gastroesophageal reflux disease)   . Hiatal hernia   . Vertigo   . Diabetes    Assessment: 68yo female admitted with SOB and chest tightness.  Symptoms concerning for possible angina. She has CAD risk factors and EKG changes suggestive of possible ischemia, enzymes so far negative. If true unstable angina her TIMI score is a 3 indicating high risk, will start heparin gtt as we complete her workup. F/u echo and further cycling of enzymes and EKGs, likely plan for nuclear stress tomorrow however if enzymes bump or abnormality on echo would consider cath.   Heparin level is therapeutic x 2 consecutive checks.    Goal of Therapy:  Achieve Heparin level 0.3-0.7 units/ml within 24hrs of Heparin initiation Monitor platelets by anticoagulation protocol: Yes   Plan:   Continue Heparin at 750 units/hr  Heparin level daily  CBC daily while on Heparin  [place] Crane, Terry Bolotin A 09/03/2014,7:33 AM

## 2014-09-03 NOTE — Progress Notes (Signed)
Consulting cardiologist: Carlyle Dolly MD Primary Cardiologist: Carlyle Dolly MD  Cardiology Specific Problem List:  1.Chest Pain 2. Dizziness 3. Dyspnea  Subjective:    No chest pain, dizziness or dyspnea. She also found relief from chronic back pain. Stress test this am.  Objective:   Temp:  [97.8 F (36.6 C)-98.7 F (37.1 C)] 98.7 F (37.1 C) (12/24 0601) Pulse Rate:  [62-79] 63 (12/24 0601) Resp:  [16-25] 18 (12/24 0601) BP: (130-139)/(67-84) 130/67 mmHg (12/24 0601) SpO2:  [98 %-100 %] 98 % (12/24 0601) Weight:  [144 lb (65.318 kg)-153 lb (69.4 kg)] 144 lb (65.318 kg) (12/24 0601) Last BM Date: 09/02/14  Filed Weights   09/02/14 0438 09/02/14 1022 09/03/14 0601  Weight: 154 lb (69.854 kg) 153 lb (69.4 kg) 144 lb (65.318 kg)    Intake/Output Summary (Last 24 hours) at 09/03/14 0811 Last data filed at 09/03/14 1443  Gross per 24 hour  Intake    480 ml  Output   3000 ml  Net  -2520 ml    Telemetry: NSR rate in the 80's.   Exam:  General: No acute distress.  HEENT: Conjunctiva and lids normal, oropharynx clear.  Lungs: Clear to auscultation, nonlabored.  Cardiac: No elevated JVP or bruits. RRR, no gallop or rub.   Abdomen: Normoactive bowel sounds, nontender, nondistended.  Extremities: No pitting edema, distal pulses full.  Neuropsychiatric: Alert and oriented x3, affect appropriate.   Lab Results:  Basic Metabolic Panel:  Recent Labs Lab 09/02/14 0547 09/02/14 0954  NA 141  --   K 3.9  --   CL 108  --   CO2 26  --   GLUCOSE 89  --   BUN 13  --   CREATININE 0.67  --   CALCIUM 9.9  --   MG  --  2.0     CBC:  Recent Labs Lab 09/02/14 0547 09/03/14 0515  WBC 6.2 5.4  HGB 13.5 12.7  HCT 42.1 39.6  MCV 86.8 87.6  PLT 140* 275    Cardiac Enzymes:  Recent Labs Lab 09/02/14 0547 09/02/14 0945 09/02/14 1749  TROPONINI <0.03 <0.03 <0.03   Coagulation:  Recent Labs Lab 09/02/14 1749  INR 1.06     Radiology: Dg Chest 2 View  09/02/2014   CLINICAL DATA:  Difficulty breathing and dizziness  EXAM: CHEST  2 VIEW  COMPARISON:  November 12, 2009  FINDINGS: There is no edema or consolidation. The heart size and pulmonary vascularity are within normal limits. There is a small hiatal hernia. No adenopathy. There is focal eventration of the right hemidiaphragm posteromedially. There is postoperative change in the lower cervical spine.  IMPRESSION: No edema or consolidation.   Electronically Signed   By: Lowella Grip M.D.   On: 09/02/2014 07:00   Echocardiogram 09/02/2014 Left ventricle: The cavity size was normal. Wall thickness was increased in a pattern of mild LVH. Systolic function was normal. The estimated ejection fraction was in the range of 55% to 60%. Diastolic function is abnormal, indeterminate grade. Wall motion was normal; there were no regional wall motion abnormalities. - Aortic valve: Mildly calcified annulus. Trileaflet; normal thickness leaflets. - Mitral valve: Mildly calcified annulus. Mildly thickened leaflets . - Left atrium: The atrium was mildly dilated. - Atrial septum: No defect or patent foramen ovale was identified. - Pulmonary arteries: PA peak pressure: 31 mm Hg (S). PASP is borderline elevated. - Systemic veins: IVC is small,suggestive of low RA pressure and hypovolemia.  ECG: NSR  with T-wave flattening in the lateral leads. Borderline  QT prolongation.   Medications:   Scheduled Medications: . amLODipine  10 mg Oral Daily   And  . benazepril  20 mg Oral Daily  . aspirin  325 mg Oral Daily  . calcium carbonate  1 tablet Oral BID WC  . cycloSPORINE  1 drop Both Eyes BID  . docusate sodium  100 mg Oral BID  . pantoprazole  40 mg Oral Daily    Infusions: . sodium chloride 10 mL/hr at 09/02/14 1127  . heparin 750 Units/hr (09/02/14 1445)    PRN Medications: acetaminophen **OR** acetaminophen, alum & mag hydroxide-simeth,  bisacodyl, HYDROcodone-acetaminophen, ibuprofen, meclizine, morphine injection, nitroGLYCERIN, ondansetron **OR** ondansetron (ZOFRAN) IV, technetium sestamibi, traZODone   Assessment and Plan:   1.Chest Tightness: Troponin is negative X 3. No further symptoms. She is scheduled for stress test this am.Will stop heparin for now.   2.Dyspnea: No further complaints.  3. Dizziness: No further complaints.   Phill Myron. Lawrence NP [facility]  09/03/2014, 8:11 AM   Patient seen and discussed with NP Purcell Nails, I agree with her documentation above. Stress test without evidence of ischemia, low calculated LVEF however normal LVEF by echo which is better test. IVC on echo suggestive of hypovolemia, some of dizziness may have been related. She was also orthostatic by pulse.  Will stop heparin, change ASA to 81mg  daily. No further cardiac testing planned at this time, she may follow up with NP Lawrence 3- 4 weeks after discharge.   Zandra Abts MD

## 2014-11-25 ENCOUNTER — Ambulatory Visit (INDEPENDENT_AMBULATORY_CARE_PROVIDER_SITE_OTHER): Payer: BLUE CROSS/BLUE SHIELD | Admitting: Family Medicine

## 2014-11-25 VITALS — BP 116/62 | HR 68 | Temp 98.3°F | Resp 18 | Wt 152.0 lb

## 2014-11-25 DIAGNOSIS — E119 Type 2 diabetes mellitus without complications: Secondary | ICD-10-CM

## 2014-11-25 DIAGNOSIS — I10 Essential (primary) hypertension: Secondary | ICD-10-CM

## 2014-11-25 DIAGNOSIS — N3281 Overactive bladder: Secondary | ICD-10-CM

## 2014-11-25 MED ORDER — MIRABEGRON ER 25 MG PO TB24: 25.0000 mg | ORAL_TABLET | Freq: Every day | ORAL | Status: DC

## 2014-11-25 MED ORDER — AMLODIPINE BESY-BENAZEPRIL HCL 10-20 MG PO CAPS: 1.0000 | ORAL_CAPSULE | Freq: Every day | ORAL | Status: DC

## 2014-11-25 MED ORDER — PANTOPRAZOLE SODIUM 40 MG PO TBEC: 40.0000 mg | DELAYED_RELEASE_TABLET | Freq: Every day | ORAL | Status: DC

## 2014-11-25 MED ORDER — POTASSIUM CHLORIDE CRYS ER 20 MEQ PO TBCR: 20.0000 meq | EXTENDED_RELEASE_TABLET | Freq: Two times a day (BID) | ORAL | Status: DC

## 2014-11-25 NOTE — Progress Notes (Signed)
Patient ID: Rebekah Gutierrez, female   DOB: May 28, 1946, 69 y.o.   MRN: [id number]   Subjective:    Patient ID: Rebekah Gutierrez, female    DOB: [birth year], 69 y.o.   MRN: [id number]  Patient presents for Follow-up  patient here to follow chronic medical problems. She has no specific concerns today. She is actually retiring this week. She's history of diabetes mellitus which is diet controlled she was admitted to the hospital back in December after an episode of diaphoresis and weakness at work. Her A1c was repeated at that time was 6.6% her cholesterol panel was normal her renal function was good. Her blood sugars at home have ranged from 90-120 fasting she did bring a record of her blood sugars in today.  Medications were reviewed she is tolerating the mall without any difficulties.  At the end of the visit she noted that she's been having overactive bladder which has been worsening over the past few months it has actually been going on for a couple years. He often wakes her up throughout the night but also when she is working she feels like she has to use the restroom very quickly or she may have an accident. She has not tried any medications for this. Her fecal incontinence is pretty much resolved she's now on fiber supplements does have a take a laxative every now and then when she gets to blocked up    Review Of Systems:  GEN- denies fatigue, fever, weight loss,weakness, recent illness HEENT- denies eye drainage, change in vision, nasal discharge, CVS- denies chest pain, palpitations RESP- denies SOB, cough, wheeze ABD- denies N/V, change in stools, abd pain GU- denies dysuria, hematuria, dribbling, incontinence MSK- denies joint pain, muscle aches, injury Neuro- denies headache, dizziness, syncope, seizure activity       Objective:    BP 116/62 mmHg  Pulse 68  Temp(Src) 98.3 F (36.8 C) (Oral)  Resp 18  Wt 152 lb (68.947 kg) GEN- NAD, alert and oriented x3 HEENT- PERRL, EOMI, non  injected sclera, pink conjunctiva, MMM, oropharynx clear CVS- RRR, no murmur RESP-CTAB ABD-NABS,soft,NT,ND EXT- No edema Pulses- Radial, 2+        Assessment & Plan:      Problem List Items Addressed This Visit      Unprioritized   Type 2 diabetes, diet controlled   OAB (overactive bladder)   Essential hypertension, benign - Primary      Note: This dictation was prepared with Dragon dictation along with smaller phrase technology. Any transcriptional errors that result from this process are unintentional.

## 2014-11-25 NOTE — Assessment & Plan Note (Signed)
Pressures well-controlled the change in medication

## 2014-11-25 NOTE — Assessment & Plan Note (Signed)
Diabetes is well controlled without medications. She continues to monitor her diet.

## 2014-11-25 NOTE — Patient Instructions (Signed)
Try the new bladder medicine Myrbetriq  Medications refilled Low sugar snacks F/U 4 months for PHYSICAL

## 2014-11-25 NOTE — Assessment & Plan Note (Signed)
Regarding the overactive bladder I will give her samples of mybetriq 25 mg if this works and she will let us know

## 2014-12-04 ENCOUNTER — Other Ambulatory Visit: Payer: Self-pay | Admitting: Family Medicine

## 2014-12-04 NOTE — Telephone Encounter (Signed)
Refill appropriate and filled per protocol. 

## 2014-12-10 ENCOUNTER — Telehealth: Payer: Self-pay | Admitting: Family Medicine

## 2014-12-10 NOTE — Telephone Encounter (Signed)
Call placed to patient. LMTRC.  

## 2014-12-10 NOTE — Telephone Encounter (Signed)
Patient is calling to tell you that she found a whole bottle of ropinirole, that she forgot she had, would like to know if she should take this or throw it in the trash can  [phone]

## 2014-12-11 MED ORDER — ROPINIROLE HCL 0.25 MG PO TABS: ORAL_TABLET | ORAL | Status: DC

## 2014-12-11 NOTE — Telephone Encounter (Signed)
Call placed to patient.   States that she noted bottle of Requip 0.25mg  (2) tabs PO QHS that she had not taken.   Reports that it was prescribed 06/25/2014.  Reports that she continues to have intermittent issues with restless leg. States that legs will feel as if something is crawling under her skin, and she will have a twitch about 2-3 times per week.   MD please advise.

## 2014-12-11 NOTE — Addendum Note (Signed)
Addended by: Sheral Flow on: 12/11/2014 11:09 AM   Modules accepted: Orders

## 2014-12-11 NOTE — Telephone Encounter (Signed)
Okay to start, take 1 tablet at bedtime for 2 week, then increase to 2 tablets if needed

## 2014-12-11 NOTE — Telephone Encounter (Signed)
Call placed to patient and patient made aware.  

## 2015-02-01 ENCOUNTER — Encounter (INDEPENDENT_AMBULATORY_CARE_PROVIDER_SITE_OTHER): Payer: Self-pay | Admitting: Internal Medicine

## 2015-02-01 ENCOUNTER — Ambulatory Visit (INDEPENDENT_AMBULATORY_CARE_PROVIDER_SITE_OTHER): Payer: Self-pay | Admitting: Internal Medicine

## 2015-02-01 VITALS — BP 126/74 | HR 76 | Temp 98.3°F | Resp 18 | Ht 60.0 in | Wt 152.0 lb

## 2015-02-01 DIAGNOSIS — K589 Irritable bowel syndrome without diarrhea: Secondary | ICD-10-CM

## 2015-02-01 DIAGNOSIS — K432 Incisional hernia without obstruction or gangrene: Secondary | ICD-10-CM

## 2015-02-01 NOTE — Patient Instructions (Signed)
Follow-up with Dr. Arnoldo Morale regarding ventral hernia at your convenience. Can substitute Metamucil or Benefiber 3-4 g per day in place of fiber pills. Next screening colonoscopy would be in 2023

## 2015-02-01 NOTE — Progress Notes (Signed)
Presenting complaint;  History of fecal incontinence.  Subjective:  Patient is 70 year old African-American female who is here for scheduled visit. She has history of fecal incontinence. She states she hasn't had any problem since she has been on fiber supplement. She has one to 2 formed stools daily. Occasionally she may have urgency or loose stool. She denies melena or rectal bleeding. She also denies abdominal pain. Her appetite is very good. Her weight has been stable. She takes ibuprofen no more than 6 or 8 times in a month. She says PPIs working. She denies dysphagia or throat symptoms. She she is looking forward to free time and pursue her hobbies since her retirement 2 months ago. She reports that she has developed hernia and epigastric region and slowly getting bigger. She has no pain associated with it. It goes down when she is lying supine.   Patient's last colonoscopy was in October 2013 with removal of 3 small polyps and these are hyperplastic.  Current Medications: Outpatient Encounter Prescriptions as of 02/01/2015  Medication Sig  . amLODipine-benazepril (LOTREL) 10-20 MG per capsule Take 1 capsule by mouth daily.  Marland Kitchen aspirin 325 MG EC tablet Take 325 mg by mouth daily.  . calcium carbonate (OS-CAL) 600 MG TABS Take 600 mg by mouth 2 (two) times daily with a meal.  . Cinnamon 500 MG TABS Take 1 tablet by mouth at bedtime.   . cycloSPORINE (RESTASIS) 0.05 % ophthalmic emulsion Place 1 drop into both eyes 2 (two) times daily.  Marland Kitchen ibuprofen (ADVIL,MOTRIN) 200 MG tablet Take 600 mg by mouth every 6 (six) hours as needed for pain.  . Multiple Vitamin (MULTIVITAMIN WITH MINERALS) TABS tablet Take 1 tablet by mouth daily.  . ONE TOUCH ULTRA TEST test strip TEST BLOOD SUGAR TWICE DAILY  . pantoprazole (PROTONIX) 40 MG tablet Take 1 tablet (40 mg total) by mouth daily.  . polycarbophil (FIBERCON) 625 MG tablet Take 625 mg by mouth daily.  . potassium chloride SA (K-DUR,KLOR-CON) 20 MEQ  tablet Take 1 tablet (20 mEq total) by mouth 2 (two) times daily.  Marland Kitchen rOPINIRole (REQUIP) 0.25 MG tablet 1-2 tabs PO QHS  . [DISCONTINUED] mirabegron ER (MYRBETRIQ) 25 MG TB24 tablet Take 1 tablet (25 mg total) by mouth daily. (Patient not taking: Reported on 02/01/2015)   No facility-administered encounter medications on file as of 02/01/2015.     Objective: Blood pressure 126/74, pulse 76, temperature 98.3 F (36.8 C), temperature source Oral, resp. rate 18, height 5' (1.524 m), weight 152 lb (68.947 kg). Patient is alert and in no acute distress. Conjunctiva is pink. Sclera is nonicteric Oropharyngeal mucosa is normal. No neck masses or thyromegaly noted. Cardiac exam with regular rhythm normal S1 and S2. No murmur or gallop noted. Lungs are clear to auscultation. Abdomen is full. Bowel sounds are normal. On palpation abdomen is soft and nontender. She has hernia and epigastric region at the site of laparoscopy scar. Hernia is completely reducible. Hernia recurs when she coughs or bears down. Size is that of half of golf ball. No LE edema or clubbing noted.     Assessment:  #1. History of fecal incontinence. He has done well with addition of fiber supplement to her diet. Last colonoscopy was in 2013 with removal of 4 small polyps and these are hypoplastic. Therefore next colonoscopy/screening would be in 2013. #2. Ventral hernia and epigastric region at site of laparoscopy scar. This hernia is gradually enlarging and she should consider getting it fixed.she will make an  appointment to see Dr. Arnoldo Morale.  #3. Chronic GERD. PPIs working.   Plan:  Can switch to Benefiber or Metamucil 3-4 g by mouth daily at bedtime. Office visit with Dr. Arnoldo Morale. Next screening colonoscopy in 2013. Office visit on as-needed basis.

## 2015-02-23 ENCOUNTER — Ambulatory Visit (INDEPENDENT_AMBULATORY_CARE_PROVIDER_SITE_OTHER): Payer: BC Managed Care – PPO | Admitting: Internal Medicine

## 2015-04-02 ENCOUNTER — Ambulatory Visit (INDEPENDENT_AMBULATORY_CARE_PROVIDER_SITE_OTHER): Payer: BC Managed Care – PPO | Admitting: Family Medicine

## 2015-04-02 ENCOUNTER — Encounter: Payer: Self-pay | Admitting: Family Medicine

## 2015-04-02 VITALS — BP 138/72 | HR 78 | Temp 99.0°F | Resp 14 | Ht 60.0 in | Wt 149.0 lb

## 2015-04-02 DIAGNOSIS — I10 Essential (primary) hypertension: Secondary | ICD-10-CM

## 2015-04-02 DIAGNOSIS — E119 Type 2 diabetes mellitus without complications: Secondary | ICD-10-CM

## 2015-04-02 MED ORDER — AMLODIPINE BESYLATE 10 MG PO TABS: 10.0000 mg | ORAL_TABLET | Freq: Every day | ORAL | Status: DC

## 2015-04-02 MED ORDER — LISINOPRIL 20 MG PO TABS: 20.0000 mg | ORAL_TABLET | Freq: Every day | ORAL | Status: DC

## 2015-04-02 NOTE — Progress Notes (Signed)
Patient ID: Rebekah Gutierrez, female   DOB: 06-Nov-1945, 69 y.o.   MRN: [id number]   Subjective:    Patient ID: Rebekah Gutierrez. Ronnald Gutierrez, female    DOB: [birth year], 69 y.o.   MRN: [id number]  Patient presents for Medicare CPE  patient was here for Medicare wellness exam however he she is actually not insured. She only has part a Medicare she does not have part B or prescription drug plan. There was some confusion when she signed up for her Medicare. She is now having a payimngof pocket for all of her medical costs. Instead of a Medicare physical exam I concentrated on prescription drug alternatives to all of her medications. She is not having any problems today He is due for Prevnar 13 however with no insurance coverage we will hold off on this    Review Of Systems:  GEN- denies fatigue, fever, weight loss,weakness, recent illness HEENT- denies eye drainage, change in vision, nasal discharge, CVS- denies chest pain, palpitations RESP- denies SOB, cough, wheeze ABD- denies N/V, change in stools, abd pain GU- denies dysuria, hematuria, dribbling, incontinence MSK- denies joint pain, muscle aches, injury Neuro- denies headache, dizziness, syncope, seizure activity       Objective:    BP 138/72 mmHg  Pulse 78  Temp(Src) 99 F (37.2 C) (Oral)  Resp 14  Ht 5' (1.524 m)  Wt 149 lb (67.586 kg)  BMI 29.10 kg/m2 GEN- NAD, alert and oriented x3 HEENT- PERRL, EOMI, non injected sclera, pink conjunctiva, MMM, oropharynx clear Neck- Supple, no thyromegaly CVS- RRR, no murmur RESP-CTAB EXT- No edema Pulses- Radial  2+        Assessment & Plan:      Problem List Items Addressed This Visit    Type 2 diabetes, diet controlled - Primary   Relevant Medications   lisinopril (PRINIVIL,ZESTRIL) 20 MG tablet   Essential hypertension, benign   Relevant Medications   lisinopril (PRINIVIL,ZESTRIL) 20 MG tablet   amLODipine (NORVASC) 10 MG tablet    Other Visit Diagnoses    Routine general medical  examination at a health care facility           Note: This dictation was prepared with Dragon dictation along with smaller phrase technology. Any transcriptional errors that result from this process are unintentional.

## 2015-04-02 NOTE — Patient Instructions (Signed)
Take printed prescriptions to [facility]  Try the nexium  Take potassium 1 tablet a day over the counter (99mg ) Call the [employer]

## 2015-04-02 NOTE — Assessment & Plan Note (Signed)
CBG < 120 fasting, no current meds

## 2015-04-02 NOTE — Assessment & Plan Note (Signed)
I switched her to lisinopril 20 mg and amlodipine 10 mg she can buy the straight out of pocket for cheaper price. She will also be changed over-the-counter potassium. I've given her samples of Nexium from the office this can also be brought over-the-counter as she cannot afford the proton aches which was $70 per month. She is working with an Medical illustrator currently trying to get drug coverage and get Plan B is sitting in his possible however I do not think this will happen until the late fall. As regards to her [employer] I gave her their prescription drug information she can call the company directly

## 2015-05-12 ENCOUNTER — Telehealth: Payer: Self-pay | Admitting: Family Medicine

## 2015-05-12 MED ORDER — ROPINIROLE HCL 0.25 MG PO TABS: ORAL_TABLET | ORAL | Status: DC

## 2015-05-12 NOTE — Telephone Encounter (Signed)
Patient reports that she received medicare coverage for medications.   States that she requires refill on Requip.   Prescription sent to pharmacy.

## 2015-05-12 NOTE — Telephone Encounter (Signed)
Patient is calling with questions regarding her ropinirole  [phone]

## 2015-06-15 ENCOUNTER — Encounter: Payer: Self-pay | Admitting: Family Medicine

## 2015-06-15 ENCOUNTER — Ambulatory Visit (INDEPENDENT_AMBULATORY_CARE_PROVIDER_SITE_OTHER): Payer: Commercial Managed Care - HMO | Admitting: Family Medicine

## 2015-06-15 VITALS — BP 130/80 | HR 68 | Temp 98.8°F | Resp 16 | Wt 153.0 lb

## 2015-06-15 DIAGNOSIS — R922 Inconclusive mammogram: Secondary | ICD-10-CM | POA: Diagnosis not present

## 2015-06-15 DIAGNOSIS — N3281 Overactive bladder: Secondary | ICD-10-CM

## 2015-06-15 DIAGNOSIS — N644 Mastodynia: Secondary | ICD-10-CM

## 2015-06-15 DIAGNOSIS — K5900 Constipation, unspecified: Secondary | ICD-10-CM | POA: Diagnosis not present

## 2015-06-15 MED ORDER — ESOMEPRAZOLE MAGNESIUM 20 MG PO CPDR: 20.0000 mg | DELAYED_RELEASE_CAPSULE | Freq: Every day | ORAL | Status: DC

## 2015-06-15 NOTE — Patient Instructions (Signed)
Continue current medications Mammogram to be scheduled Use miralax for your bowels F/U as previous

## 2015-06-15 NOTE — Progress Notes (Signed)
Patient ID: Rebekah Gutierrez, female   DOB: June 01, 1946, 69 y.o.   MRN: [id number]   Subjective:    Patient ID: Rebekah Gutierrez. Ronnald Gutierrez, female    DOB: February 25, 1946, 69 y.o.   MRN: [id number]  Patient presents for Breast Pain  patient here due to breast discomfort she's had discomfort in the right breast for the past month. She is not feeling any particular not to get sharp pains that shoot through. She's not had any drainage from the nipple and no color changes in the skin. She's not had a mammogram in a few years because they cause her discomfort.  She continues have overactive bladder she cannot remember the medicine I gave her last time work and she will like to try something different. She also has problems with her constipation and she uses over-the-counter medication once and at their something else she could try.    Review Of Systems:  GEN- denies fatigue, fever, weight loss,weakness, recent illness HEENT- denies eye drainage, change in vision, nasal discharge, CVS- denies chest pain, palpitations RESP- denies SOB, cough, wheeze ABD- denies N/V, +change in stools, abd pain GU- denies dysuria, hematuria, dribbling, incontinence MSK- denies joint pain, muscle aches, injury Neuro- denies headache, dizziness, syncope, seizure activity       Objective:    BP 130/80 mmHg  Pulse 68  Temp(Src) 98.8 F (37.1 C) (Oral)  Resp 16  Wt 153 lb (69.4 kg) GEN- NAD, alert and oriented x3 Neck- Supple, no LAD Breast- normal symmetry, no nipple inversion,no nipple drainage, nodular/fribous issue bilatterally R >l TTP at 3 oclock position right breast but definite mass palpated  Nodes- no axillary nodes CVS- RRR, no murmur RESP-CTAB ABD-NABS,soft,NT,ND        Assessment & Plan:      Problem List Items Addressed This Visit    OAB (overactive bladder)    Will try the Mrybetriq again she can not recall if this helped       Constipation    Miralax once a day        Other Visit Diagnoses    Breast pain, right    -  Primary    Obtain diagnostic mammogram, very dense lumpy breast bilat    Relevant Orders    MM Digital Diagnostic Bilat    Dense breast tissue        Relevant Orders    MM Digital Diagnostic Bilat       Note: This dictation was prepared with Dragon dictation along with smaller phrase technology. Any transcriptional errors that result from this process are unintentional.

## 2015-06-16 ENCOUNTER — Other Ambulatory Visit: Payer: Self-pay | Admitting: *Deleted

## 2015-06-16 MED ORDER — ROPINIROLE HCL 0.25 MG PO TABS: ORAL_TABLET | ORAL | Status: DC

## 2015-06-16 MED ORDER — ESOMEPRAZOLE MAGNESIUM 20 MG PO CPDR: 20.0000 mg | DELAYED_RELEASE_CAPSULE | Freq: Every day | ORAL | Status: DC

## 2015-06-16 MED ORDER — POTASSIUM CHLORIDE CRYS ER 20 MEQ PO TBCR: 20.0000 meq | EXTENDED_RELEASE_TABLET | Freq: Two times a day (BID) | ORAL | Status: DC

## 2015-06-16 MED ORDER — GLUCOSE BLOOD VI STRP: ORAL_STRIP | Status: DC

## 2015-06-16 MED ORDER — AMLODIPINE BESYLATE 10 MG PO TABS: 10.0000 mg | ORAL_TABLET | Freq: Every day | ORAL | Status: DC

## 2015-06-16 MED ORDER — CALCIUM POLYCARBOPHIL 625 MG PO TABS: 1250.0000 mg | ORAL_TABLET | Freq: Every day | ORAL | Status: DC

## 2015-06-16 MED ORDER — LISINOPRIL 20 MG PO TABS: 20.0000 mg | ORAL_TABLET | Freq: Every day | ORAL | Status: DC

## 2015-06-16 NOTE — Assessment & Plan Note (Signed)
Will try the Mrybetriq again she can not recall if this helped

## 2015-06-16 NOTE — Assessment & Plan Note (Signed)
Miralax once a day

## 2015-06-25 DIAGNOSIS — H16229 Keratoconjunctivitis sicca, not specified as Sjogren's, unspecified eye: Secondary | ICD-10-CM | POA: Diagnosis not present

## 2015-06-25 DIAGNOSIS — H04129 Dry eye syndrome of unspecified lacrimal gland: Secondary | ICD-10-CM | POA: Diagnosis not present

## 2015-06-29 ENCOUNTER — Ambulatory Visit (HOSPITAL_COMMUNITY)
Admission: RE | Admit: 2015-06-29 | Discharge: 2015-06-29 | Disposition: A | Discharge status: Home / Self Care | Payer: Commercial Managed Care - HMO | Source: Ambulatory Visit | Attending: Family Medicine | Admitting: Family Medicine

## 2015-06-29 ENCOUNTER — Other Ambulatory Visit: Payer: Self-pay | Admitting: Family Medicine

## 2015-06-29 DIAGNOSIS — N644 Mastodynia: Secondary | ICD-10-CM | POA: Diagnosis not present

## 2015-06-29 DIAGNOSIS — R922 Inconclusive mammogram: Secondary | ICD-10-CM | POA: Insufficient documentation

## 2015-07-19 ENCOUNTER — Telehealth: Payer: Self-pay | Admitting: Family Medicine

## 2015-07-19 NOTE — Telephone Encounter (Signed)
Patient is calling to say that she would like referral to [facility] to see dr timothy bevis

## 2015-07-22 NOTE — Telephone Encounter (Signed)
LMTRC

## 2015-07-27 NOTE — Telephone Encounter (Signed)
LMTRC

## 2015-08-03 NOTE — Telephone Encounter (Signed)
Contacted pt and she stated she has appt with Dr. Talbert Forest on Dec 9 and needed a [facility] referral, I submitted the referral with a pending authorization of [id number]

## 2015-08-04 ENCOUNTER — Telehealth: Payer: Self-pay | Admitting: *Deleted

## 2015-08-04 NOTE — Telephone Encounter (Signed)
Submitted [employer] referral thru [facility] connect for authorization on 08/03/15 to Dr. 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

## 2015-08-20 DIAGNOSIS — H2512 Age-related nuclear cataract, left eye: Secondary | ICD-10-CM | POA: Diagnosis not present

## 2015-08-20 DIAGNOSIS — H18411 Arcus senilis, right eye: Secondary | ICD-10-CM | POA: Diagnosis not present

## 2015-08-20 DIAGNOSIS — H2511 Age-related nuclear cataract, right eye: Secondary | ICD-10-CM | POA: Diagnosis not present

## 2015-08-20 DIAGNOSIS — H18412 Arcus senilis, left eye: Secondary | ICD-10-CM | POA: Diagnosis not present

## 2015-09-09 ENCOUNTER — Other Ambulatory Visit: Payer: Self-pay | Admitting: *Deleted

## 2015-09-09 MED ORDER — TRUE METRIX AIR GLUCOSE METER DEVI: 1.0000 | Freq: Two times a day (BID) | Status: DC

## 2015-09-09 MED ORDER — TRUEPLUS LANCETS 33G MISC
Status: DC
Start: 2015-09-09 — End: 2016-01-20

## 2015-09-09 MED ORDER — GLUCOSE BLOOD VI STRP
ORAL_STRIP | Status: DC
Start: 2015-09-09 — End: 2016-01-20

## 2015-09-09 NOTE — Telephone Encounter (Signed)
Received fax requesting refill on [employer].   Refill appropriate and filled per protocol.

## 2015-09-10 ENCOUNTER — Telehealth: Payer: Self-pay | Admitting: Family Medicine

## 2015-09-10 NOTE — Telephone Encounter (Signed)
If [employer] has issues, they can contact us directly.

## 2015-09-10 NOTE — Telephone Encounter (Signed)
Patient calling to say that [facility] called her, and they requested we call them regarding her prescriptions  Please call [employer] at [phone]

## 2015-09-30 ENCOUNTER — Telehealth: Payer: Self-pay | Admitting: Family Medicine

## 2015-09-30 MED ORDER — AMLODIPINE BESYLATE 10 MG PO TABS: 10.0000 mg | ORAL_TABLET | Freq: Every day | ORAL | Status: DC

## 2015-09-30 MED ORDER — ROPINIROLE HCL 0.25 MG PO TABS: ORAL_TABLET | ORAL | Status: DC

## 2015-09-30 MED ORDER — LISINOPRIL 20 MG PO TABS: 20.0000 mg | ORAL_TABLET | Freq: Every day | ORAL | Status: DC

## 2015-09-30 NOTE — Telephone Encounter (Signed)
Mail order for [facility] is [facility].   Prescription sent to pharmacy.

## 2015-09-30 NOTE — Telephone Encounter (Signed)
Pt is in need of a 90 day supply of Amlodapine, Lisinopril and Ropinirole called in to [facility].  You can reach her @ [phone]

## 2015-10-04 DIAGNOSIS — H25812 Combined forms of age-related cataract, left eye: Secondary | ICD-10-CM | POA: Diagnosis not present

## 2015-10-04 DIAGNOSIS — H2512 Age-related nuclear cataract, left eye: Secondary | ICD-10-CM | POA: Diagnosis not present

## 2015-10-05 DIAGNOSIS — H2511 Age-related nuclear cataract, right eye: Secondary | ICD-10-CM | POA: Diagnosis not present

## 2015-10-11 DIAGNOSIS — H25812 Combined forms of age-related cataract, left eye: Secondary | ICD-10-CM | POA: Diagnosis not present

## 2015-10-25 DIAGNOSIS — H25812 Combined forms of age-related cataract, left eye: Secondary | ICD-10-CM | POA: Diagnosis not present

## 2015-10-25 DIAGNOSIS — H25813 Combined forms of age-related cataract, bilateral: Secondary | ICD-10-CM | POA: Diagnosis not present

## 2015-10-25 DIAGNOSIS — H2511 Age-related nuclear cataract, right eye: Secondary | ICD-10-CM | POA: Diagnosis not present

## 2015-10-25 DIAGNOSIS — H25811 Combined forms of age-related cataract, right eye: Secondary | ICD-10-CM | POA: Diagnosis not present

## 2015-11-01 ENCOUNTER — Ambulatory Visit (INDEPENDENT_AMBULATORY_CARE_PROVIDER_SITE_OTHER): Payer: PPO | Admitting: Family Medicine

## 2015-11-01 DIAGNOSIS — Z111 Encounter for screening for respiratory tuberculosis: Secondary | ICD-10-CM | POA: Diagnosis not present

## 2015-11-01 NOTE — Progress Notes (Signed)
Pt here for PPD TB skin test for employer.  Understands need to return Wednesday for reading.  Applied without problem to right forearm.

## 2015-11-03 ENCOUNTER — Encounter: Payer: Self-pay | Admitting: Family Medicine

## 2015-11-03 ENCOUNTER — Ambulatory Visit (INDEPENDENT_AMBULATORY_CARE_PROVIDER_SITE_OTHER): Payer: PPO | Admitting: Family Medicine

## 2015-11-03 ENCOUNTER — Ambulatory Visit: Payer: Self-pay

## 2015-11-03 VITALS — BP 130/78 | HR 90 | Temp 99.3°F | Resp 16 | Ht 60.0 in | Wt 154.0 lb

## 2015-11-03 DIAGNOSIS — J189 Pneumonia, unspecified organism: Secondary | ICD-10-CM

## 2015-11-03 LAB — TB SKIN TEST
Induration: 0 mm
TB Skin Test: NEGATIVE

## 2015-11-03 MED ORDER — AZITHROMYCIN 250 MG PO TABS: ORAL_TABLET | ORAL | Status: DC

## 2015-11-03 MED ORDER — GUAIFENESIN-CODEINE 100-10 MG/5ML PO SOLN: 5.0000 mL | Freq: Four times a day (QID) | ORAL | Status: DC | PRN

## 2015-11-03 MED ORDER — CEFTRIAXONE SODIUM 1 G IJ SOLR
1.0000 g | Freq: Once | INTRAMUSCULAR | Status: AC
  Administered 2015-11-03: 1 g via INTRAMUSCULAR

## 2015-11-03 MED ORDER — CEFTRIAXONE SODIUM 1 G IJ SOLR: 1.0000 g | INTRAMUSCULAR | Status: DC

## 2015-11-03 NOTE — Progress Notes (Signed)
Patient ID: Rebekah Gutierrez, female   DOB: Dec 10, 1945, 70 y.o.   MRN: [id number]    Subjective:    Patient ID: Rebekah Gutierrez, female    DOB: 1946-09-08, 70 y.o.   MRN: [id number]  Patient presents for Illness and PPD Reading  Patient with 4 days of fever cough with production mild muscle aches. The cough is worse she has thick green sputum. She is also a smoker. She denies any chest pain. He has had the flu shot she does not feel she has the flu. Her fever has been very low-grade and the last was a couple days ago. She has been taking over-the-counter Robitussin and Sudafed with minimal improvement.    Review Of Systems:  GEN- + fatigue,+ fever, weight loss,weakness, recent illness HEENT- denies eye drainage, change in vision, nasal discharge, CVS- denies chest pain, palpitations RESP- denies SOB, +cough, wheeze ABD- denies N/V, change in stools, abd pain GU- denies dysuria, hematuria, dribbling, incontinence MSK- denies joint pain, muscle aches, injury Neuro- + headache, dizziness, syncope, seizure activity       Objective:    BP 130/78 mmHg  Pulse 90  Temp(Src) 99.3 F (37.4 C) (Oral)  Resp 16  Ht 5' (1.524 m)  Wt 154 lb (69.854 kg)  BMI 30.08 kg/m2  SpO2 98% GEN- NAD, alert and oriented x3 HEENT- PERRL, EOMI, non injected sclera, pink conjunctiva, MMM, oropharynx clear, no maxillary sinus tenderness Neck- Supple, no LAD  CVS- RRR, no murmur RESP-Rales right base, no wheeze, normal WOB at rest, sat 98%  EXT- No edema Pulses- Radial,  2+        Assessment & Plan:      Problem List Items Addressed This Visit    None    Visit Diagnoses    CAP (community acquired pneumonia)    -  Primary    Concern for CAP based on exam, she is also a smoker, treat with Rocephin 1gram in office, azithromycin, cough medicine given, based on exam, not Influenza    Relevant Medications    azithromycin (ZITHROMAX) 250 MG tablet    guaiFENesin-codeine 100-10 MG/5ML syrup    cefTRIAXone  (ROCEPHIN) injection 1 g (Completed)       Note: This dictation was prepared with Dragon dictation along with smaller phrase technology. Any transcriptional errors that result from this process are unintentional.

## 2015-11-03 NOTE — Patient Instructions (Signed)
Take antibiotics Rocephin shot given  Take cough medicine Get fluids  F/U as previous

## 2016-01-03 ENCOUNTER — Other Ambulatory Visit: Payer: Self-pay | Admitting: Family Medicine

## 2016-01-03 NOTE — Telephone Encounter (Signed)
Refill appropriate and filled per protocol. 

## 2016-01-07 ENCOUNTER — Other Ambulatory Visit: Payer: Self-pay | Admitting: Family Medicine

## 2016-01-07 NOTE — Telephone Encounter (Signed)
Refill appropriate and filled per protocol. 

## 2016-01-20 ENCOUNTER — Telehealth: Payer: Self-pay | Admitting: Family Medicine

## 2016-01-20 MED ORDER — GLUCOSE BLOOD VI STRP: ORAL_STRIP | Status: DC

## 2016-01-20 MED ORDER — TRUEPLUS LANCETS 33G MISC: Status: DC

## 2016-01-20 NOTE — Telephone Encounter (Signed)
Pt is requesting a refill of lancets and test strips for her One Touch ultra II meter.  [facility] in [place]

## 2016-01-20 NOTE — Telephone Encounter (Signed)
Prescription sent to pharmacy.

## 2016-03-10 ENCOUNTER — Telehealth: Payer: Self-pay | Admitting: Family Medicine

## 2016-03-10 MED ORDER — AMLODIPINE BESYLATE 10 MG PO TABS: 10.0000 mg | ORAL_TABLET | Freq: Every day | ORAL | Status: DC

## 2016-03-10 MED ORDER — LISINOPRIL 20 MG PO TABS: 20.0000 mg | ORAL_TABLET | Freq: Every day | ORAL | Status: DC

## 2016-03-10 MED ORDER — POTASSIUM CHLORIDE CRYS ER 20 MEQ PO TBCR: 20.0000 meq | EXTENDED_RELEASE_TABLET | Freq: Two times a day (BID) | ORAL | Status: DC

## 2016-03-10 NOTE — Telephone Encounter (Signed)
Pt is requesting a refill of Amlodipine, Lisinopril and potassium [facility] mail order

## 2016-03-10 NOTE — Telephone Encounter (Signed)
Prescription sent to pharmacy.

## 2016-04-20 DIAGNOSIS — K432 Incisional hernia without obstruction or gangrene: Secondary | ICD-10-CM | POA: Diagnosis not present

## 2016-05-02 ENCOUNTER — Ambulatory Visit (INDEPENDENT_AMBULATORY_CARE_PROVIDER_SITE_OTHER): Payer: PPO | Admitting: Family Medicine

## 2016-05-02 ENCOUNTER — Encounter: Payer: Self-pay | Admitting: Family Medicine

## 2016-05-02 DIAGNOSIS — M4806 Spinal stenosis, lumbar region: Secondary | ICD-10-CM | POA: Diagnosis not present

## 2016-05-02 DIAGNOSIS — M5136 Other intervertebral disc degeneration, lumbar region: Secondary | ICD-10-CM | POA: Diagnosis not present

## 2016-05-02 DIAGNOSIS — M48061 Spinal stenosis, lumbar region without neurogenic claudication: Secondary | ICD-10-CM

## 2016-05-02 MED ORDER — METHYLPREDNISOLONE 4 MG PO TBPK: ORAL_TABLET | ORAL | 0 refills | Status: DC

## 2016-05-02 NOTE — Patient Instructions (Signed)
Take steroids prescribed  MRI Lumbar to be done  F/U pending results

## 2016-05-02 NOTE — Assessment & Plan Note (Signed)
Due to previous fusion obtain CT lumbar spine Given Medrol dosepak as well  No red flags today Concern she has worsening stenosis

## 2016-05-02 NOTE — Progress Notes (Signed)
    Subjective:    Patient ID: Rebekah Gutierrez. [name], female    DOB: 1945-10-05, 70 y.o.   MRN: [id number]  Patient presents for BLE Pain (x1 month- R hip pain/edema x1 week and L LE numbness)   Patient here with joint pain for the past month. She's had left leg pain with the numbness on the lateral aspect of her leg coming mostly from her lower buttocks for the past month. Over the weekend she began having a swelling over her right hip and some low back pain. She has history of significant degenerative disc disease with central stenosis she's had back surgery 2 by Dr. Dayton Bailiff she's also had fusion performed in her cervical spine. She denies any change in bowel or bladder. Denies in tingling or numbness in her feet. She thought it was initially the shoes that she was wearing and she will get a burning sensation with walking when she was wearing her sandals but when she switched to her tennis shoes she did not have this discomfort. When she does get the pain which is typically when she is walking in her lower back and into her leg on the left side along with the numbness when she sits down it goes away.   Review Of Systems:  GEN- denies fatigue, fever, weight loss,weakness, recent illness HEENT- denies eye drainage, change in vision, nasal discharge, CVS- denies chest pain, palpitations RESP- denies SOB, cough, wheeze ABD- denies N/V, change in stools, abd pain GU- denies dysuria, hematuria, dribbling, incontinence MSK- + joint pain, muscle aches, injury Neuro- denies headache, dizziness, syncope, seizure activity       Objective:    BP 124/60 (BP Location: Left Arm, Patient Position: Sitting, Cuff Size: Normal)   Pulse 82   Temp 98.8 F (37.1 C) (Oral)   Resp 14   Ht 5' (1.524 m)   Wt 153 lb (69.4 kg)   BMI 29.88 kg/m  GEN- NAD, alert and oriented x3 HEENT- PERRL, EOMI, non injected sclera, pink conjunctiva, MMM, oropharynx clear Neck- Supple, no thyromegaly CVS- RRR, no  murmur RESP-CTAB ABD-NABS,soft,NT,ND Mild swelling over right hip, no erythema, no breaks in skin MSK- Mild TTP Left lumbar spine, healed surgical scar midline, fair ROM spine, +SLR left side, fair ROM HIPS/KNEES. Non antalgic gait Neuro- sensation grossly in tact, normal tone bilat strength decreaesd left compared to right EXT- No edema Pulses- Radial, DP- 2+        Assessment & Plan:      Problem List Items Addressed This Visit    Spinal stenosis of lumbar region    Due to previous fusion obtain CT lumbar spine Given Medrol dosepak as well  No red flags today Concern she has worsening stenosis      Relevant Orders   CT Lumbar Spine W Contrast   DDD (degenerative disc disease), lumbar   Relevant Medications   methylPREDNISolone (MEDROL DOSEPAK) 4 MG TBPK tablet   Other Relevant Orders   CT Lumbar Spine W Contrast    Other Visit Diagnoses   None.     Note: This dictation was prepared with Dragon dictation along with smaller phrase technology. Any transcriptional errors that result from this process are unintentional.

## 2016-05-23 ENCOUNTER — Ambulatory Visit (HOSPITAL_COMMUNITY)
Admission: RE | Admit: 2016-05-23 | Discharge: 2016-05-23 | Disposition: A | Discharge status: Home / Self Care | Payer: PPO | Source: Ambulatory Visit | Attending: Family Medicine | Admitting: Family Medicine

## 2016-05-23 DIAGNOSIS — M5126 Other intervertebral disc displacement, lumbar region: Secondary | ICD-10-CM | POA: Diagnosis not present

## 2016-05-23 DIAGNOSIS — I700 Atherosclerosis of aorta: Secondary | ICD-10-CM | POA: Diagnosis not present

## 2016-05-23 DIAGNOSIS — M4806 Spinal stenosis, lumbar region: Secondary | ICD-10-CM | POA: Diagnosis not present

## 2016-05-23 DIAGNOSIS — M48061 Spinal stenosis, lumbar region without neurogenic claudication: Secondary | ICD-10-CM

## 2016-05-23 DIAGNOSIS — M5136 Other intervertebral disc degeneration, lumbar region: Secondary | ICD-10-CM | POA: Diagnosis not present

## 2016-05-23 LAB — POCT I-STAT CREATININE: CREATININE: 0.8 mg/dL (ref 0.44–1.00)

## 2016-05-23 MED ORDER — IOPAMIDOL (ISOVUE-300) INJECTION 61%
75.0000 mL | Freq: Once | INTRAVENOUS | Status: AC | PRN
  Administered 2016-05-23: 75 mL via INTRAVENOUS

## 2016-05-25 ENCOUNTER — Other Ambulatory Visit: Payer: Self-pay | Admitting: *Deleted

## 2016-05-25 DIAGNOSIS — M549 Dorsalgia, unspecified: Secondary | ICD-10-CM

## 2016-05-25 DIAGNOSIS — M5136 Other intervertebral disc degeneration, lumbar region: Secondary | ICD-10-CM

## 2016-05-25 DIAGNOSIS — M48061 Spinal stenosis, lumbar region without neurogenic claudication: Secondary | ICD-10-CM

## 2016-06-30 ENCOUNTER — Other Ambulatory Visit: Payer: Self-pay | Admitting: *Deleted

## 2016-06-30 MED ORDER — LISINOPRIL 20 MG PO TABS: 20.0000 mg | ORAL_TABLET | Freq: Every day | ORAL | 0 refills | Status: DC

## 2016-06-30 MED ORDER — ESOMEPRAZOLE MAGNESIUM 20 MG PO CPDR: 20.0000 mg | DELAYED_RELEASE_CAPSULE | Freq: Every day | ORAL | 0 refills | Status: DC

## 2016-06-30 MED ORDER — POTASSIUM CHLORIDE CRYS ER 20 MEQ PO TBCR: 20.0000 meq | EXTENDED_RELEASE_TABLET | Freq: Two times a day (BID) | ORAL | 1 refills | Status: DC

## 2016-06-30 MED ORDER — AMLODIPINE BESYLATE 10 MG PO TABS: 10.0000 mg | ORAL_TABLET | Freq: Every day | ORAL | 0 refills | Status: DC

## 2016-06-30 NOTE — Telephone Encounter (Signed)
Received call from patient.   Requested refill on routine meds to mail order.   Refill appropriate and filled per protocol.

## 2016-08-22 ENCOUNTER — Encounter: Payer: Self-pay | Admitting: Family Medicine

## 2016-10-16 ENCOUNTER — Other Ambulatory Visit: Payer: Self-pay | Admitting: *Deleted

## 2016-10-16 MED ORDER — LISINOPRIL 20 MG PO TABS: 20.0000 mg | ORAL_TABLET | Freq: Every day | ORAL | 3 refills | Status: DC

## 2016-10-16 MED ORDER — ROPINIROLE HCL 0.25 MG PO TABS: 0.2500 mg | ORAL_TABLET | Freq: Every day | ORAL | 3 refills | Status: DC

## 2016-10-16 MED ORDER — AMLODIPINE BESYLATE 10 MG PO TABS: 10.0000 mg | ORAL_TABLET | Freq: Every day | ORAL | 3 refills | Status: DC

## 2016-10-16 NOTE — Telephone Encounter (Signed)
Received call from patient.   Requested refill on Requip, Norvasc and Lisinopril to go to mail order.   Prescription sent to pharmacy.

## 2017-02-27 ENCOUNTER — Ambulatory Visit (INDEPENDENT_AMBULATORY_CARE_PROVIDER_SITE_OTHER): Payer: PPO

## 2017-02-27 DIAGNOSIS — Z111 Encounter for screening for respiratory tuberculosis: Secondary | ICD-10-CM

## 2017-02-27 NOTE — Progress Notes (Signed)
Tb skin test performed in left forearm.Pt tolerated well

## 2017-03-01 ENCOUNTER — Ambulatory Visit: Payer: PPO | Admitting: *Deleted

## 2017-03-01 DIAGNOSIS — Z111 Encounter for screening for respiratory tuberculosis: Secondary | ICD-10-CM

## 2017-03-01 LAB — TB SKIN TEST
INDURATION: 0 mm
TB SKIN TEST: NEGATIVE

## 2017-03-01 NOTE — Progress Notes (Signed)
Patient in office to have PPD read.   No redness and no induration noted   Read as negative.

## 2017-04-23 ENCOUNTER — Encounter (HOSPITAL_COMMUNITY): Payer: Self-pay

## 2017-04-23 ENCOUNTER — Emergency Department (HOSPITAL_COMMUNITY): Payer: PPO

## 2017-04-23 ENCOUNTER — Emergency Department (HOSPITAL_COMMUNITY)
Admission: EM | Admit: 2017-04-23 | Discharge: 2017-04-23 | Disposition: A | Discharge status: Home / Self Care | Payer: PPO | Attending: Emergency Medicine | Admitting: Emergency Medicine

## 2017-04-23 DIAGNOSIS — R911 Solitary pulmonary nodule: Secondary | ICD-10-CM

## 2017-04-23 DIAGNOSIS — J209 Acute bronchitis, unspecified: Secondary | ICD-10-CM | POA: Diagnosis not present

## 2017-04-23 DIAGNOSIS — Z79899 Other long term (current) drug therapy: Secondary | ICD-10-CM | POA: Insufficient documentation

## 2017-04-23 DIAGNOSIS — Z7982 Long term (current) use of aspirin: Secondary | ICD-10-CM | POA: Diagnosis not present

## 2017-04-23 DIAGNOSIS — R0602 Shortness of breath: Secondary | ICD-10-CM | POA: Diagnosis not present

## 2017-04-23 DIAGNOSIS — E119 Type 2 diabetes mellitus without complications: Secondary | ICD-10-CM | POA: Diagnosis not present

## 2017-04-23 DIAGNOSIS — J40 Bronchitis, not specified as acute or chronic: Secondary | ICD-10-CM | POA: Diagnosis not present

## 2017-04-23 DIAGNOSIS — F17210 Nicotine dependence, cigarettes, uncomplicated: Secondary | ICD-10-CM | POA: Insufficient documentation

## 2017-04-23 DIAGNOSIS — R05 Cough: Secondary | ICD-10-CM | POA: Diagnosis not present

## 2017-04-23 LAB — BASIC METABOLIC PANEL
Anion gap: 10 (ref 5–15)
BUN: 13 mg/dL (ref 6–20)
CO2: 29 mmol/L (ref 22–32)
Calcium: 9.6 mg/dL (ref 8.9–10.3)
Chloride: 103 mmol/L (ref 101–111)
Creatinine, Ser: 0.78 mg/dL (ref 0.44–1.00)
Glucose, Bld: 123 mg/dL — ABNORMAL HIGH (ref 65–99)
Potassium: 3.5 mmol/L (ref 3.5–5.1)
SODIUM: 142 mmol/L (ref 135–145)

## 2017-04-23 LAB — CBC WITH DIFFERENTIAL/PLATELET
BASOS ABS: 0 10*3/uL (ref 0.0–0.1)
BASOS PCT: 1 %
Eosinophils Absolute: 0.1 10*3/uL (ref 0.0–0.7)
Eosinophils Relative: 1 %
HEMATOCRIT: 40.8 % (ref 36.0–46.0)
HEMOGLOBIN: 13.6 g/dL (ref 12.0–15.0)
Lymphocytes Relative: 50 %
Lymphs Abs: 3.1 10*3/uL (ref 0.7–4.0)
MCH: 28.5 pg (ref 26.0–34.0)
MCHC: 33.3 g/dL (ref 30.0–36.0)
MCV: 85.4 fL (ref 78.0–100.0)
MONO ABS: 0.5 10*3/uL (ref 0.1–1.0)
Monocytes Relative: 8 %
NEUTROS ABS: 2.5 10*3/uL (ref 1.7–7.7)
NEUTROS PCT: 40 %
Platelets: 262 10*3/uL (ref 150–400)
RBC: 4.78 MIL/uL (ref 3.87–5.11)
RDW: 13.3 % (ref 11.5–15.5)
WBC: 6.2 10*3/uL (ref 4.0–10.5)

## 2017-04-23 LAB — D-DIMER, QUANTITATIVE (NOT AT ARMC): D DIMER QUANT: 0.57 ug{FEU}/mL — AB (ref 0.00–0.50)

## 2017-04-23 MED ORDER — ALBUTEROL SULFATE HFA 108 (90 BASE) MCG/ACT IN AERS: 2.0000 | INHALATION_SPRAY | RESPIRATORY_TRACT | 1 refills | Status: DC | PRN

## 2017-04-23 MED ORDER — IOPAMIDOL (ISOVUE-370) INJECTION 76%
100.0000 mL | Freq: Once | INTRAVENOUS | Status: AC | PRN
  Administered 2017-04-23: 100 mL via INTRAVENOUS

## 2017-04-23 MED ORDER — SULFAMETHOXAZOLE-TRIMETHOPRIM 800-160 MG PO TABS: 1.0000 | ORAL_TABLET | Freq: Two times a day (BID) | ORAL | 0 refills | Status: DC

## 2017-04-23 MED ORDER — IPRATROPIUM-ALBUTEROL 0.5-2.5 (3) MG/3ML IN SOLN
3.0000 mL | Freq: Once | RESPIRATORY_TRACT | Status: AC
  Administered 2017-04-23: 3 mL via RESPIRATORY_TRACT
  Filled 2017-04-23: qty 3

## 2017-04-23 NOTE — ED Triage Notes (Signed)
Pt reports cold symptoms x 2 weeks.  Reports cough productive at times.

## 2017-04-23 NOTE — ED Provider Notes (Signed)
[facility] DEPT Provider Note   CSN: [id number] Arrival date & time: 04/23/17  9030     History   Chief Complaint Chief Complaint  Patient presents with  . URI    HPI Rebekah Gutierrez is a 71 y.o. female.  The history is provided by the patient. No language interpreter was used.  URI   This is a new problem. The problem has not changed since onset.There has been no fever. Associated symptoms include congestion and cough. Pertinent negatives include no chest pain. She has tried nothing for the symptoms. The treatment provided moderate relief.    Past Medical History:  Diagnosis Date  . Diabetes ([place])   . GERD (gastroesophageal reflux disease)   . Hiatal hernia   . Hypertension   . Vertigo     Patient Active Problem List   Diagnosis Date Noted  . Spinal stenosis of lumbar region 05/02/2016  . DDD (degenerative disc disease), lumbar 05/02/2016  . Constipation 06/15/2015  . OAB (overactive bladder) 11/25/2014  . Pain in the chest 09/02/2014  . Nonspecific abnormal electrocardiogram (ECG) (EKG) 09/02/2014  . Chest pain at rest 09/02/2014  . Dizziness   . Type 2 diabetes, diet controlled ([place]) 01/21/2014  . Fecal incontinence alternating with constipation 01/21/2014  . B12 deficiency 02/06/2013  . Shoulder pain 08/23/2012  . Encounter for screening colonoscopy 06/05/2012  . Leg cramps 04/29/2012  . Vertigo, benign positional 01/28/2012  . Dyspepsia 11/29/2011  . Insomnia 11/29/2011  . Tobacco user 11/29/2011  . Essential hypertension, benign 11/27/2011    Past Surgical History:  Procedure Laterality Date  . BACK SURGERY    . BLADDER SURGERY    . CERVICAL SPINE SURGERY     fusion  . COLONOSCOPY  06/20/2012   Procedure: COLONOSCOPY;  Surgeon: Rogene Houston, MD;  Location: [facility];  Service: Endoscopy;  Laterality: N/A;  250  . FOOT SURGERY     left   . HAND SURGERY     left  . TUBAL LIGATION      OB History    No data available       Home  Medications    Prior to Admission medications   Medication Sig Start Date End Date Taking? Authorizing Provider  amLODipine (NORVASC) 10 MG tablet Take 1 tablet (10 mg total) by mouth daily. 10/16/16   [name], MD  aspirin 325 MG EC tablet Take 325 mg by mouth daily.    [provider]  Blood Glucose Monitoring Suppl (TRUE METRIX AIR GLUCOSE METER) DEVI 1 each by Does not apply route 2 (two) times daily. Use to monitor FSBS 2x daily Dx: E11.9 09/09/15   [name], MD  calcium carbonate (OS-CAL) 600 MG TABS Take 600 mg by mouth 2 (two) times daily with a meal.    [provider]  Cinnamon 500 MG TABS Take 1 tablet by mouth at bedtime.     [provider]  cycloSPORINE (RESTASIS) 0.05 % ophthalmic emulsion Place 1 drop into both eyes 2 (two) times daily. 02/06/13   [name], MD  esomeprazole (NEXIUM) 20 MG capsule Take 1 capsule (20 mg total) by mouth daily at 12 noon. 06/30/16   [name], MD  glucose blood test strip Use to monitor FSBS 2x daily Dx: E11.9 01/20/16   [name], MD  ibuprofen (ADVIL,MOTRIN) 200 MG tablet Take 600 mg by mouth every 6 (six) hours as needed for pain.    [provider]  lisinopril (PRINIVIL,ZESTRIL) 20 MG tablet Take 1 tablet (20 mg total) by mouth daily. 10/16/16   [name], MD  methylPREDNISolone (MEDROL DOSEPAK) 4 MG TBPK tablet Take as directed on package 05/02/16   [name], MD  mirabegron ER (MYRBETRIQ) 25 MG TB24 tablet Take 25 mg by mouth daily.    [provider]  Multiple Vitamin (MULTIVITAMIN WITH MINERALS) TABS tablet Take 1 tablet by mouth daily.    [provider]  polycarbophil (FIBERCON) 625 MG tablet Take 2 tablets (1,250 mg total) by mouth daily. 06/16/15   Firebaugh, Modena Nunnery, MD  potassium chloride SA (K-DUR,KLOR-CON) 20 MEQ tablet Take 1 tablet (20 mEq total) by mouth 2 (two) times daily. 06/30/16   Huer[place]ano, Modena Nunnery, MD  rOPINIRole (REQUIP)  0.25 MG tablet Take 1-2 tablets (0.25-0.5 mg total) by mouth at bedtime. 10/16/16   [name], MD  TRUEPLUS LANCETS 33G MISC Use to monitor FSBS 2x daily Dx: E11.9 01/20/16   [name], MD    Family History Family History  Problem Relation Age of Onset  . Hypertension Mother   . Stroke Mother   . Hypertension Father   . Cancer Father   . Colon cancer Neg Hx     Social History Social History  Substance Use Topics  . Smoking status: Current Every Day Smoker    Packs/day: 0.50    Years: 45.00    Types: Cigarettes  . Smokeless tobacco: Never Used     Comment: 1/2 pack a day since age 27  . Alcohol use No     Allergies   Patient has no known allergies.   Review of Systems Review of Systems  HENT: Positive for congestion.   Respiratory: Positive for cough.   Cardiovascular: Negative for chest pain.  All other systems reviewed and are negative.    Physical Exam Updated Vital Signs BP (!) 153/101   Pulse (!) 103   Temp 98.4 F (36.9 C) (Oral)   Resp 19   Ht 5' (1.524 m)   Wt 69.4 kg (153 lb)   SpO2 98%   BMI 29.88 kg/m   Physical Exam  Constitutional: She is oriented to person, place, and time. She appears well-developed and well-nourished.  HENT:  Head: Normocephalic.  Eyes: EOM are normal.  Neck: Normal range of motion.  Cardiovascular: Normal rate.   Harsh breath sounds   Pulmonary/Chest: Effort normal.  Abdominal: She exhibits no distension.  Musculoskeletal: Normal range of motion.  Neurological: She is alert and oriented to person, place, and time.  Psychiatric: She has a normal mood and affect.  Nursing note and vitals reviewed.    ED Treatments / Results  Labs (all labs ordered are listed, but only abnormal results are displayed) Labs Reviewed - No data to display  EKG  EKG Interpretation None       Radiology Dg Chest 2 View  Result Date: 04/23/2017 CLINICAL DATA:  Cough and congestion for 2 weeks.  Hypertension. EXAM:  CHEST  2 VIEW COMPARISON:  September 02, 2014 FINDINGS: There is slight scarring in the lung bases. There is no appreciable edema or consolidation. Heart size and pulmonary vascularity are normal. No adenopathy. Aorta is somewhat tortuous. There is postoperative change in the lower cervical region. IMPRESSION: Slight bibasilar scarring. No edema or consolidation. Stable cardiac silhouette. Electronically Signed   By: Lowella Grip III M.D.   On: 04/23/2017 08:59   Ct Angio Chest Pe W And/or  Wo Contrast  Result Date: 04/23/2017 CLINICAL DATA:  Shortness of breath with cough EXAM: CT ANGIOGRAPHY CHEST WITH CONTRAST TECHNIQUE: Multidetector CT imaging of the chest was performed using the standard protocol during bolus administration of intravenous contrast. Multiplanar CT image reconstructions and MIPs were obtained to evaluate the vascular anatomy. CONTRAST:  100 mL Isovue 370 nonionic COMPARISON:  Chest radiograph April 23, 2017 FINDINGS: Cardiovascular: There is no demonstrable pulmonary embolus. There is no thoracic aortic aneurysm or dissection. The visualized great vessels appear unremarkable. There are scattered foci of aortic atherosclerosis. There are foci of coronary artery calcification at multiple sites. The pericardium is not appreciably thickened. Mediastinum/Nodes: Thyroid appears unremarkable. There is no appreciable thoracic adenopathy. There is a focal hiatal hernia. Lungs/Pleura: There is a nodular opacity abutting the pleura in the posterior aspect of the apical segment of the right upper lobe measuring 5 x 4 mm. This opacity is seen on axial slice 20 series 6. On axial slice 28 series 6, there is a 3 mm nodular opacity in the anterior segment of the right upper lobe. There is a 2 mm nodular opacity in the anterior segment right upper lobe on axial slice 31 series 6. There is a 2 mm nodular opacity in the anterior segment of the right upper lobe seen on axial slice 39 series 6. On axial  slice 35 series 6, there is a 3 mm nodular opacity in the posterior segment of the right upper lobe. On axial slice 67, there is a 5 x 5 mm nodular opacity in the inferior aspect of the anterior segment of the left lower lobe. On axial slice 53 series 6, there is a 5 x 4 mm nodular opacity in the inferior most aspect of the posterior segment of the right lower lobe. On axial slice 42 series 6, there is a 4 mm nodular opacity in the posterior segment right upper lobe. On axial slice 44 series 6, there is a 5 x 4 mm nodular opacity in the medial segment of the right middle lobe. There is patchy atelectasis in the right lower lobe, primarily in the superior and posterior segments. There is no frank edema or consolidation. No pleural effusion or pleural thickening evident. Upper Abdomen: Visualized upper abdominal structures appear unremarkable except for a probable adenoma in the left adrenal measuring 1.2 x 1.0 cm. Musculoskeletal: There is degenerative change in the thoracic spine. There is postoperative change in the lower cervical region. There are no blastic or lytic bone lesions. Review of the MIP images confirms the above findings. IMPRESSION: 1.  No demonstrable pulmonary embolus. 2. No thoracic aortic aneurysm or dissection. There are foci of aortic atherosclerosis as well as foci of coronary artery calcification. 3. Multiple parenchymal nodular opacities, largest measuring 5 mm. No larger lesions evident. No follow-up needed if patient is low-risk (and has no known or suspected primary neoplasm). Non-contrast chest CT can be considered in 12 months if patient is high-risk. This recommendation follows the consensus statement: Guidelines for Management of Incidental Pulmonary Nodules Detected on CT Images: From the Fleischner Society 2017; Radiology 2017; 284:228-243. 4.  No evident adenopathy. 5.  Hiatal hernia. 6.  Probable small left adrenal adenoma. Aortic Atherosclerosis (ICD10-I70.0). Electronically  Signed   By: Lowella Grip III M.D.   On: 04/23/2017 11:24    Procedures Procedures (including critical care time)  Medications Ordered in ED Medications - No data to display   Initial Impression / Assessment and Plan / ED Course  I  have reviewed the triage vital signs and the nursing notes.  Pertinent labs & imaging results that were available during my care of the patient were reviewed by me and considered in my medical decision making (see chart for details).     Pt given duoneb and reports she feels better.  Pt counseled on Ct scan.  She is advised of need for follow up due to pulmonary nodule. An After Visit Summary was printed and given to the patient.   Final Clinical Impressions(s) / ED Diagnoses   Final diagnoses:  Bronchitis  Pulmonary nodule    New Prescriptions Discharge Medication List as of 04/23/2017 11:39 AM    START taking these medications   Details  albuterol (PROVENTIL HFA;VENTOLIN HFA) 108 (90 Base) MCG/ACT inhaler Inhale 2 puffs into the lungs every 4 (four) hours as needed for wheezing or shortness of breath., Starting Mon 04/23/2017, Print    sulfamethoxazole-trimethoprim (BACTRIM DS,SEPTRA DS) 800-160 MG tablet Take 1 tablet by mouth 2 (two) times daily., Starting Mon 04/23/2017, Until Mon 04/30/2017, Print         Fransico Meadow, [place] 04/24/17 2707    Margette Fast, MD 04/24/17 1124

## 2017-04-23 NOTE — Discharge Instructions (Signed)
See your Physician for recheck.  You need to have a repeat Ct in 1 year

## 2017-04-30 ENCOUNTER — Ambulatory Visit (INDEPENDENT_AMBULATORY_CARE_PROVIDER_SITE_OTHER): Payer: PPO | Admitting: Family Medicine

## 2017-04-30 ENCOUNTER — Encounter: Payer: Self-pay | Admitting: Family Medicine

## 2017-04-30 VITALS — BP 128/72 | HR 80 | Temp 98.5°F | Resp 14 | Ht 60.0 in | Wt 149.0 lb

## 2017-04-30 DIAGNOSIS — E119 Type 2 diabetes mellitus without complications: Secondary | ICD-10-CM | POA: Diagnosis not present

## 2017-04-30 DIAGNOSIS — I700 Atherosclerosis of aorta: Secondary | ICD-10-CM | POA: Diagnosis not present

## 2017-04-30 DIAGNOSIS — Z720 Tobacco use: Secondary | ICD-10-CM

## 2017-04-30 DIAGNOSIS — I10 Essential (primary) hypertension: Secondary | ICD-10-CM

## 2017-04-30 DIAGNOSIS — R918 Other nonspecific abnormal finding of lung field: Secondary | ICD-10-CM | POA: Diagnosis not present

## 2017-04-30 LAB — CBC WITH DIFFERENTIAL/PLATELET
BASOS PCT: 1 %
Basophils Absolute: 60 cells/uL (ref 0–200)
EOS ABS: 120 {cells}/uL (ref 15–500)
Eosinophils Relative: 2 %
HEMATOCRIT: 39.7 % (ref 35.0–45.0)
Hemoglobin: 13.3 g/dL (ref 12.0–15.0)
LYMPHS ABS: 3060 {cells}/uL (ref 850–3900)
Lymphocytes Relative: 51 %
MCH: 28.6 pg (ref 27.0–33.0)
MCHC: 33.5 g/dL (ref 32.0–36.0)
MCV: 85.4 fL (ref 80.0–100.0)
MONO ABS: 420 {cells}/uL (ref 200–950)
MONOS PCT: 7 %
MPV: 9.4 fL (ref 7.5–12.5)
NEUTROS ABS: 2340 {cells}/uL (ref 1500–7800)
Neutrophils Relative %: 39 %
PLATELETS: 285 10*3/uL (ref 140–400)
RBC: 4.65 MIL/uL (ref 3.80–5.10)
RDW: 14.2 % (ref 11.0–15.0)
WBC: 6 10*3/uL (ref 3.8–10.8)

## 2017-04-30 MED ORDER — POTASSIUM CHLORIDE CRYS ER 20 MEQ PO TBCR: 20.0000 meq | EXTENDED_RELEASE_TABLET | Freq: Two times a day (BID) | ORAL | 2 refills | Status: DC

## 2017-04-30 NOTE — Assessment & Plan Note (Addendum)
Blood pressure well controlled no change to medications.

## 2017-04-30 NOTE — Assessment & Plan Note (Signed)
Diet control , she has some atherosclerosis noted on her CT scan in the aortic region. I'm an appointment to start her on statin drug will check a lipid panel today. We'll plan to start with generic atorvastatin

## 2017-04-30 NOTE — Progress Notes (Signed)
Subjective:    Patient ID: Rebekah Gutierrez. [name], female    DOB: [birth year], 71 y.o.   MRN: [id number]  Patient presents for ER F/U (bronchitis) Issue here for ER follow-up. She was seen in the ER secondary to bronchitis she has CT of chest performed which also show pulmonary nodule. She completed antibiotics, but is still uses albuterol , denies any SOB, minimal cough  Tobacco history continues to smoke  She was treated with DuoNeb and then sent home with Bactrim and albuterol. Also on review for CT of chest she had aortic atherosclerosis she is due for lipid panel. She is not on any statin drug.  RLS- taking requip as prescribed  DM- diet controlled home CBG readings on her printout 110-143 past 6 months    CT CHEST  IMPRESSION: 1.  No demonstrable pulmonary embolus.  2. No thoracic aortic aneurysm or dissection. There are foci of aortic atherosclerosis as well as foci of coronary artery calcification.  3. Multiple parenchymal nodular opacities, largest measuring 5 mm. No larger lesions evident. No follow-up needed if patient is low-risk (and has no known or suspected primary neoplasm). Non-contrast chest CT can be considered in 12 months if patient is high-risk. This recommendation follows the consensus statement: Guidelines for Management of Incidental Pulmonary Nodules Detected on CT Images: From the Fleischner Society 2017; Radiology 2017; 284:228-243. Marland Kitchen  Hiatal hernia.   Probable small left adrenal adenoma. Aortic Atherosclerosis (ICD10-I70.0).  Review Of Systems:  GEN- denies fatigue, fever, weight loss,weakness, recent illness HEENT- denies eye drainage, change in vision, nasal discharge, CVS- denies chest pain, palpitations RESP- denies SOB,+ cough, wheeze ABD- denies N/V, change in stools, abd pain GU- denies dysuria, hematuria, dribbling, incontinence MSK- denies joint pain, muscle aches, injury Neuro- denies headache, dizziness, syncope, seizure  activity       Objective:    BP 128/72   Pulse 80   Temp 98.5 F (36.9 C) (Oral)   Resp 14   Ht 5' (1.524 m)   Wt 149 lb (67.6 kg)   SpO2 99%   BMI 29.10 kg/m  GEN- NAD, alert and oriented x3 HEENT- PERRL, EOMI, non injected sclera, pink conjunctiva, MMM, oropharynx clear Neck- Supple, no thyromegaly CVS- RRR, no murmur RESP-CTAB ABD-NABS,soft,NT,ND EXT- No edema Pulses- Radial, DP- 2+        Assessment & Plan:      Problem List Items Addressed This Visit      Unprioritized   Type 2 diabetes, diet controlled ([facility])    Diet control , she has some atherosclerosis noted on her CT scan in the aortic region. I'm an appointment to start her on statin drug will check a lipid panel today. We'll plan to start with generic atorvastatin      Relevant Orders   Hemoglobin A1c   Tobacco user    Counseled on tobacco cessation. I believe her bronchitis is much resolved. Advised she can just use her albuterol as needed we'll see how often she truly requires. Regarding the lung nodules she will have a repeat CT scan in 6 months she is higher risk cuts of her smoking for lung cancer.      Multiple lung nodules on CT   Essential hypertension, benign - Primary    Blood pressure well controlled no change to medications.      Relevant Orders   CBC with Differential/Platelet   Comprehensive metabolic panel   Lipid panel    Other Visit Diagnoses    Aortic atherosclerosis (  [place])       Relevant Orders   Lipid panel      Note: This dictation was prepared with Dragon dictation along with smaller phrase technology. Any transcriptional errors that result from this process are unintentional.

## 2017-04-30 NOTE — Patient Instructions (Addendum)
F/U 6 months for Physical  We will call with lab results YOu need to [place]!!!

## 2017-04-30 NOTE — Assessment & Plan Note (Signed)
Counseled on tobacco cessation. I believe her bronchitis is much resolved. Advised she can just use her albuterol as needed we'll see how often she truly requires. Regarding the lung nodules she will have a repeat CT scan in 6 months she is higher risk cuts of her smoking for lung cancer.

## 2017-05-01 LAB — COMPREHENSIVE METABOLIC PANEL
ALK PHOS: 99 U/L (ref 33–130)
ALT: 23 U/L (ref 6–29)
AST: 25 U/L (ref 10–35)
Albumin: 4.3 g/dL (ref 3.6–5.1)
BUN: 19 mg/dL (ref 7–25)
CO2: 23 mmol/L (ref 20–32)
Calcium: 9.8 mg/dL (ref 8.6–10.4)
Chloride: 105 mmol/L (ref 98–110)
Creat: 0.83 mg/dL (ref 0.50–0.99)
Glucose, Bld: 109 mg/dL — ABNORMAL HIGH (ref 70–99)
POTASSIUM: 4.4 mmol/L (ref 3.5–5.3)
Sodium: 141 mmol/L (ref 135–146)
TOTAL PROTEIN: 7.4 g/dL (ref 6.1–8.1)
Total Bilirubin: 0.3 mg/dL (ref 0.2–1.2)

## 2017-05-01 LAB — LIPID PANEL
Cholesterol: 143 mg/dL (ref <200)
HDL: 34 mg/dL — ABNORMAL LOW (ref >50)
LDL CALC: 78 mg/dL (ref <100)
TRIGLYCERIDES: 156 mg/dL — AB (ref <150)
Total CHOL/HDL Ratio: 4.2 Ratio (ref <5.0)
VLDL: 31 mg/dL — ABNORMAL HIGH (ref <30)

## 2017-05-01 LAB — HEMOGLOBIN A1C
HEMOGLOBIN A1C: 6.1 % — AB (ref <5.7)
Mean Plasma Glucose: 128 mg/dL

## 2017-05-02 ENCOUNTER — Encounter: Payer: Self-pay | Admitting: *Deleted

## 2017-06-26 ENCOUNTER — Telehealth: Payer: Self-pay | Admitting: Family Medicine

## 2017-06-26 ENCOUNTER — Other Ambulatory Visit: Payer: Self-pay | Admitting: Family Medicine

## 2017-06-26 ENCOUNTER — Ambulatory Visit (INDEPENDENT_AMBULATORY_CARE_PROVIDER_SITE_OTHER): Payer: PPO | Admitting: Family Medicine

## 2017-06-26 DIAGNOSIS — Z111 Encounter for screening for respiratory tuberculosis: Secondary | ICD-10-CM

## 2017-06-26 MED ORDER — GLUCOSE BLOOD VI STRP: 1.0000 | ORAL_STRIP | Freq: Two times a day (BID) | 4 refills | Status: DC

## 2017-06-26 MED ORDER — ONETOUCH DELICA LANCETS 33G MISC: 1.0000 | Freq: Two times a day (BID) | 4 refills | Status: DC

## 2017-06-26 NOTE — Telephone Encounter (Signed)
Test strips/lancets refilled

## 2017-06-26 NOTE — Patient Instructions (Signed)
PPD TB skin applied to rt forearm with problem.  Pt denies ever having had a positive test.  Instructed not to rub or scratch test area.  Return Thursday for results

## 2017-06-26 NOTE — Telephone Encounter (Signed)
Prescription sent to pharmacy.   Call placed to patient and patient made aware per VM. 

## 2017-06-26 NOTE — Telephone Encounter (Signed)
Patient is calling to request refills on her test strips and lancets  Please call when done [phone] [facility] [place]

## 2017-06-28 ENCOUNTER — Encounter: Payer: Self-pay | Admitting: Physician Assistant

## 2017-06-28 ENCOUNTER — Ambulatory Visit (INDEPENDENT_AMBULATORY_CARE_PROVIDER_SITE_OTHER): Payer: PPO | Admitting: Physician Assistant

## 2017-06-28 ENCOUNTER — Ambulatory Visit
Admission: RE | Admit: 2017-06-28 | Discharge: 2017-06-28 | Disposition: A | Discharge status: Home / Self Care | Payer: PPO | Source: Ambulatory Visit | Attending: Physician Assistant | Admitting: Physician Assistant

## 2017-06-28 VITALS — BP 122/78 | HR 82 | Temp 98.6°F | Resp 18 | Ht 60.0 in | Wt 154.0 lb

## 2017-06-28 DIAGNOSIS — J9811 Atelectasis: Secondary | ICD-10-CM | POA: Diagnosis not present

## 2017-06-28 DIAGNOSIS — R7611 Nonspecific reaction to tuberculin skin test without active tuberculosis: Secondary | ICD-10-CM | POA: Diagnosis not present

## 2017-06-28 NOTE — Progress Notes (Signed)
Patient ID: Rebekah Gutierrez MRN: [id number], DOB: 1946-08-10, 71 y.o. Date of Encounter: 06/28/2017, 12:03 PM    Chief Complaint:  Chief Complaint  Patient presents with  . PPD Reading    red slightly raised area to R inner arm at PPD placement     HPI: 71 y.o. year old female came in today, originally on nurse schedule to read results of PPD. Nurse staff was concerned that PPD positive so Rebekah Gutierrez was then added to my schedule for evaluation.  On her right forearm she does have round/oval area of diffuse erythema.  I measured this and get 18 mm x 15 mm.  I then reviewed the following information with patient. She reports that in the past she worked at [employer]. Says that for the past one year she did "sit with a lady" for one year. 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  She reports that she had a PPD skin test performed one year ago and that one was negative. She reports that she has never had an abnormal/positive PPT skin test in the past.  She reports that she has had no known contact with anyone with TB or TB type symptoms.  She reports she has had no cough. No fever or chills. No weight loss. No night sweats. No hemoptysis.     Home Meds:   Outpatient Medications Prior to Visit  Medication Sig Dispense Refill  . albuterol (PROVENTIL HFA;VENTOLIN HFA) 108 (90 Base) MCG/ACT inhaler Inhale 2 puffs into the lungs every 4 (four) hours as needed for wheezing or shortness of breath. 1 Inhaler 1  . amLODipine (NORVASC) 10 MG tablet Take 1 tablet (10 mg total) by mouth daily. 90 tablet 3  . aspirin 325 MG EC tablet Take 325 mg by mouth daily.    . calcium carbonate (OS-CAL) 600 MG TABS Take 600 mg by mouth 2 (two) times daily with a meal.    . Cinnamon 500 MG TABS Take 1 tablet by mouth at bedtime.     Marland Kitchen esomeprazole (NEXIUM) 20 MG capsule Take 1 capsule (20 mg total) by mouth daily at 12 noon. 90 capsule 0  . glucose blood test strip 1  each by Other route 2 (two) times daily. Use as instructed 100 each 4  . ibuprofen (ADVIL,MOTRIN) 200 MG tablet Take 600 mg by mouth every 6 (six) hours as needed for pain.    Marland Kitchen lisinopril (PRINIVIL,ZESTRIL) 20 MG tablet Take 1 tablet (20 mg total) by mouth daily. 90 tablet 3  . Multiple Vitamin (MULTIVITAMIN WITH MINERALS) TABS tablet Take 1 tablet by mouth daily.    Glory Rosebush DELICA LANCETS 19J MISC 1 each by Does not apply route 2 (two) times daily. 100 each 4  . polycarbophil (FIBERCON) 625 MG tablet Take 2 tablets (1,250 mg total) by mouth daily. 180 tablet 0  . potassium chloride SA (K-DUR,KLOR-CON) 20 MEQ tablet Take 1 tablet (20 mEq total) by mouth 2 (two) times daily. 180 tablet 2  . rOPINIRole (REQUIP) 0.25 MG tablet Take 1-2 tablets (0.25-0.5 mg total) by mouth at bedtime. 180 tablet 3   No facility-administered medications prior to visit.     Allergies: No Known Allergies    Review of Systems: See HPI for pertinent ROS. All other ROS negative.    Physical Exam: Blood pressure 122/78, pulse 82, temperature 98.6 F (37 C), temperature source Oral, resp. rate 18, height 5' (1.524 m),  weight 69.9 kg (154 lb), SpO2 98 %., Body mass index is 30.08 kg/m. General: WNWD AAF.  Appears in no acute distress. Neck: Supple. No thyromegaly. No lymphadenopathy. Lungs: Clear bilaterally to auscultation without wheezes, rales, or rhonchi. Breathing is unlabored. Heart: Regular rhythm. No murmurs, rubs, or gallops. Msk:  Strength and tone normal for age. Extremities/Skin: Warm and dry.  Neuro: Alert and oriented X 3. Moves all extremities spontaneously. Gait is normal. CNII-XII grossly in tact. Psych:  Responds to questions appropriately with a normal affect.     ASSESSMENT AND PLAN:  71 y.o. year old female with  1. Positive PPD Will obtain chest x-ray and quantity of air and TB cold assay to further evaluate. Follow up with patient once I get these results. - DG Chest 2 View;  Future - Quantiferon tb gold assay   Signed, [address] [place], [place], [facility] 06/28/2017 12:03 PM

## 2017-06-29 ENCOUNTER — Ambulatory Visit: Payer: PPO | Admitting: Family Medicine

## 2017-06-29 DIAGNOSIS — R7611 Nonspecific reaction to tuberculin skin test without active tuberculosis: Secondary | ICD-10-CM

## 2017-06-29 LAB — TB SKIN TEST: TB Skin Test: POSITIVE

## 2017-06-29 NOTE — Patient Instructions (Signed)
Pt has positive PPD TB test read yesterday.  Came back today to get results of CXR and blood work done.  Lab results have not returned yet.  CXR was negative and pt give a copy of result.

## 2017-06-30 LAB — QUANTIFERON TB GOLD ASSAY (BLOOD)
Mitogen-Nil: >10 IU/mL
QUANTIFERON NIL VALUE: 0.06 [IU]/mL
QUANTIFERON(R)-TB GOLD: NEGATIVE
Quantiferon Tb Ag Minus Nil Value: 0.15 IU/mL

## 2017-08-28 ENCOUNTER — Emergency Department (HOSPITAL_COMMUNITY)
Admission: EM | Admit: 2017-08-28 | Discharge: 2017-08-28 | Disposition: A | Discharge status: Home / Self Care | Payer: No Typology Code available for payment source | Attending: Emergency Medicine | Admitting: Emergency Medicine

## 2017-08-28 ENCOUNTER — Encounter (HOSPITAL_COMMUNITY): Payer: Self-pay | Admitting: Emergency Medicine

## 2017-08-28 ENCOUNTER — Emergency Department (HOSPITAL_COMMUNITY): Payer: No Typology Code available for payment source

## 2017-08-28 DIAGNOSIS — Y929 Unspecified place or not applicable: Secondary | ICD-10-CM | POA: Insufficient documentation

## 2017-08-28 DIAGNOSIS — E119 Type 2 diabetes mellitus without complications: Secondary | ICD-10-CM | POA: Diagnosis not present

## 2017-08-28 DIAGNOSIS — S299XXA Unspecified injury of thorax, initial encounter: Secondary | ICD-10-CM | POA: Diagnosis present

## 2017-08-28 DIAGNOSIS — Z7982 Long term (current) use of aspirin: Secondary | ICD-10-CM | POA: Insufficient documentation

## 2017-08-28 DIAGNOSIS — I10 Essential (primary) hypertension: Secondary | ICD-10-CM | POA: Diagnosis not present

## 2017-08-28 DIAGNOSIS — F17210 Nicotine dependence, cigarettes, uncomplicated: Secondary | ICD-10-CM | POA: Diagnosis not present

## 2017-08-28 DIAGNOSIS — Y999 Unspecified external cause status: Secondary | ICD-10-CM | POA: Insufficient documentation

## 2017-08-28 DIAGNOSIS — S20219A Contusion of unspecified front wall of thorax, initial encounter: Secondary | ICD-10-CM | POA: Diagnosis not present

## 2017-08-28 DIAGNOSIS — Y939 Activity, unspecified: Secondary | ICD-10-CM | POA: Diagnosis not present

## 2017-08-28 DIAGNOSIS — Z79899 Other long term (current) drug therapy: Secondary | ICD-10-CM | POA: Insufficient documentation

## 2017-08-28 MED ORDER — NAPROXEN 250 MG PO TABS
500.0000 mg | ORAL_TABLET | Freq: Once | ORAL | Status: AC
  Administered 2017-08-28: 500 mg via ORAL
  Filled 2017-08-28: qty 2

## 2017-08-28 MED ORDER — NAPROXEN 500 MG PO TABS: 500.0000 mg | ORAL_TABLET | Freq: Two times a day (BID) | ORAL | 0 refills | Status: DC
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
## 2017-08-28 NOTE — Discharge Instructions (Signed)
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
## 2017-11-13 ENCOUNTER — Other Ambulatory Visit: Payer: Self-pay | Admitting: *Deleted

## 2017-11-13 MED ORDER — LISINOPRIL 20 MG PO TABS: 20.0000 mg | ORAL_TABLET | Freq: Every day | ORAL | 3 refills | Status: DC

## 2017-11-13 MED ORDER — AMLODIPINE BESYLATE 10 MG PO TABS: 10.0000 mg | ORAL_TABLET | Freq: Every day | ORAL | 3 refills | Status: DC

## 2017-11-14 ENCOUNTER — Encounter: Payer: Self-pay | Admitting: Family Medicine

## 2018-04-08 DIAGNOSIS — H52223 Regular astigmatism, bilateral: Secondary | ICD-10-CM | POA: Diagnosis not present

## 2018-04-08 LAB — HM DIABETES EYE EXAM

## 2018-05-07 DIAGNOSIS — E669 Obesity, unspecified: Secondary | ICD-10-CM | POA: Diagnosis not present

## 2018-05-07 DIAGNOSIS — Z720 Tobacco use: Secondary | ICD-10-CM | POA: Diagnosis not present

## 2018-05-07 DIAGNOSIS — Z833 Family history of diabetes mellitus: Secondary | ICD-10-CM | POA: Diagnosis not present

## 2018-05-07 DIAGNOSIS — Z809 Family history of malignant neoplasm, unspecified: Secondary | ICD-10-CM | POA: Diagnosis not present

## 2018-05-07 DIAGNOSIS — Z8249 Family history of ischemic heart disease and other diseases of the circulatory system: Secondary | ICD-10-CM | POA: Diagnosis not present

## 2018-05-07 DIAGNOSIS — Z823 Family history of stroke: Secondary | ICD-10-CM | POA: Diagnosis not present

## 2018-05-07 DIAGNOSIS — Z6830 Body mass index (BMI) 30.0-30.9, adult: Secondary | ICD-10-CM | POA: Diagnosis not present

## 2018-05-07 DIAGNOSIS — I10 Essential (primary) hypertension: Secondary | ICD-10-CM | POA: Diagnosis not present

## 2018-05-21 ENCOUNTER — Ambulatory Visit (HOSPITAL_COMMUNITY)
Admission: RE | Admit: 2018-05-21 | Discharge: 2018-05-21 | Disposition: A | Discharge status: Home / Self Care | Payer: Medicare HMO | Source: Ambulatory Visit | Attending: Family Medicine | Admitting: Family Medicine

## 2018-05-21 ENCOUNTER — Ambulatory Visit (INDEPENDENT_AMBULATORY_CARE_PROVIDER_SITE_OTHER): Payer: Medicare HMO | Admitting: Family Medicine

## 2018-05-21 ENCOUNTER — Other Ambulatory Visit: Payer: Self-pay

## 2018-05-21 ENCOUNTER — Encounter: Payer: Self-pay | Admitting: Family Medicine

## 2018-05-21 VITALS — BP 122/70 | HR 62 | Temp 98.7°F | Resp 14 | Ht 60.0 in | Wt 154.0 lb

## 2018-05-21 DIAGNOSIS — M5136 Other intervertebral disc degeneration, lumbar region: Secondary | ICD-10-CM | POA: Diagnosis not present

## 2018-05-21 DIAGNOSIS — M25551 Pain in right hip: Secondary | ICD-10-CM

## 2018-05-21 DIAGNOSIS — M5137 Other intervertebral disc degeneration, lumbosacral region: Secondary | ICD-10-CM | POA: Diagnosis not present

## 2018-05-21 DIAGNOSIS — M5441 Lumbago with sciatica, right side: Secondary | ICD-10-CM | POA: Diagnosis not present

## 2018-05-21 DIAGNOSIS — M48061 Spinal stenosis, lumbar region without neurogenic claudication: Secondary | ICD-10-CM

## 2018-05-21 DIAGNOSIS — M25552 Pain in left hip: Secondary | ICD-10-CM

## 2018-05-21 MED ORDER — METHOCARBAMOL 500 MG PO TABS: 500.0000 mg | ORAL_TABLET | Freq: Three times a day (TID) | ORAL | 0 refills | Status: DC | PRN

## 2018-05-21 MED ORDER — PREDNISONE 20 MG PO TABS: ORAL_TABLET | ORAL | 0 refills | Status: DC

## 2018-05-21 NOTE — Patient Instructions (Signed)
Sciatica Sciatica is pain, numbness, weakness, or tingling along the path of the sciatic nerve. The sciatic nerve starts in the lower back and runs down the back of each leg. The nerve controls the muscles in the lower leg and in the back of the knee. It also provides feeling (sensation) to the back of the thigh, the lower leg, and the sole of the foot. Sciatica is a symptom of another medical condition that pinches or puts pressure on the sciatic nerve. Generally, sciatica only affects one side of the body. Sciatica usually goes away on its own or with treatment. In some cases, sciatica may keep coming back (recur). What are the causes? This condition is caused by pressure on the sciatic nerve, or pinching of the sciatic nerve. This may be the result of:  A disk in between the bones of the spine (vertebrae) bulging out too far (herniated disk).  Age-related changes in the spinal disks (degenerative disk disease).  A pain disorder that affects a muscle in the buttock (piriformis syndrome).  Extra bone growth (bone spur) near the sciatic nerve.  An injury or break (fracture) of the pelvis.  Pregnancy.  Tumor (rare). What increases the risk? The following factors may make you more likely to develop this condition:  Playing sports that place pressure or stress on the spine, such as football or weight lifting.  Having poor strength and flexibility.  A history of back injury.  A history of back surgery.  Sitting for long periods of time.  Doing activities that involve repetitive bending or lifting.  Obesity. What are the signs or symptoms? Symptoms can vary from mild to very severe, and they may include:  Any of these problems in the lower back, leg, hip, or buttock:  Mild tingling or dull aches.  Burning sensations.  Sharp pains.  Numbness in the back of the calf or the sole of the foot.  Leg weakness.  Severe back pain that makes movement difficult. These symptoms may  get worse when you cough, sneeze, or laugh, or when you sit or stand for long periods of time. Being overweight may also make symptoms worse. In some cases, symptoms may recur over time. How is this diagnosed? This condition may be diagnosed based on:  Your symptoms.  A physical exam. Your health care provider may ask you to do certain movements to check whether those movements trigger your symptoms.  You may have tests, including:  Blood tests.  X-rays.  MRI.  CT scan. How is this treated? In many cases, this condition improves on its own, without any treatment. However, treatment may include:  Reducing or modifying physical activity during periods of pain.  Exercising and stretching to strengthen your abdomen and improve the flexibility of your spine.  Icing and applying heat to the affected area.  Medicines that help:  To relieve pain and swelling.  To relax your muscles.  Injections of medicines that help to relieve pain, irritation, and inflammation around the sciatic nerve (steroids).  Surgery. Follow these instructions at home: Medicines   Take over-the-counter and prescription medicines only as told by your health care provider.  Do not drive or operate heavy machinery while taking prescription pain medicine. Managing pain   If directed, apply ice to the affected area.  Put ice in a plastic bag.  Place a towel between your skin and the bag.  Leave the ice on for 20 minutes, 2-3 times a day.  After icing, apply heat to the   heat to the affected area before you exercise or as often as told by your health care provider. Use the heat source that your health care provider recommends, such as a moist heat pack or a heating pad. ? Place a towel between your skin and the heat source. ? Leave the heat on for 20-30 minutes. ? Remove the heat if your skin turns bright red. This is especially important if you are unable to feel pain, heat, or cold. You may have a  greater risk of getting burned. Activity  Return to your normal activities as told by your health care provider. Ask your health care provider what activities are safe for you. ? Avoid activities that make your symptoms worse.  Take brief periods of rest throughout the day. Resting in a lying or standing position is usually better than sitting to rest. ? When you rest for longer periods, mix in some mild activity or stretching between periods of rest. This will help to prevent stiffness and pain. ? Avoid sitting for long periods of time without moving. Get up and move around at least one time each hour.  Exercise and stretch regularly, as told by your health care provider.  Do not lift anything that is heavier than 10 lb (4.5 kg) while you have symptoms of sciatica. When you do not have symptoms, you should still avoid heavy lifting, especially repetitive heavy lifting.  When you lift objects, always use proper lifting technique, which includes: ? Bending your knees. ? Keeping the load close to your body. ? Avoiding twisting. General instructions  Use good posture. ? Avoid leaning forward while sitting. ? Avoid hunching over while standing.  Maintain a healthy weight. Excess weight puts extra stress on your back and makes it difficult to maintain good posture.  Wear supportive, comfortable shoes. Avoid wearing high heels.  Avoid sleeping on a mattress that is too soft or too hard. A mattress that is firm enough to support your back when you sleep may help to reduce your pain.  Keep all follow-up visits as told by your health care provider. This is important. Contact a health care provider if:  You have pain that wakes you up when you are sleeping.  You have pain that gets worse when you lie down.  Your pain is worse than you have experienced in the past.  Your pain lasts longer than 4 weeks.  You experience unexplained weight loss. Get help right away if:  You lose control  of your bowel or bladder (incontinence).  You have: ? Weakness in your lower back, pelvis, buttocks, or legs that gets worse. ? Redness or swelling of your back. ? A burning sensation when you urinate. This information is not intended to replace advice given to you by your health care provider. Make sure you discuss any questions you have with your health care provider. Document Released: 08/22/2001 Document Revised: 02/01/2016 Document Reviewed: 05/07/2015 Elsevier Interactive Patient Education  2018 [place]. Degenerative Disk Disease Degenerative disk disease is a condition caused by the changes that occur in spinal disks as you grow older. Spinal disks are soft and compressible disks located between the bones of your spine (vertebrae). These disks act like shock absorbers. Degenerative disk disease can affect the whole spine. However, the neck and lower back are most commonly affected. Many changes can occur in the spinal disks with aging, such as:  The spinal disks may dry and shrink.  Small tears may occur in the tough, outer  covering of the disk (annulus).  The disk space may become smaller due to loss of water.  Abnormal growths in the bone (spurs) may occur. This can put pressure on the nerve roots exiting the spinal canal, causing pain.  The spinal canal may become narrowed.  What increases the risk?  Being overweight.  Having a family history of degenerative disk disease.  Smoking.  There is increased risk if you are doing heavy lifting or have a sudden injury. What are the signs or symptoms? Symptoms vary from person to person and may include:  Pain that varies in intensity. Some people have no pain, while others have severe pain. The location of the pain depends on the part of your backbone that is affected. ? You will have neck or arm pain if a disk in the neck area is affected. ? You will have pain in your back, buttocks, or legs if a disk in the lower back is  affected.  Pain that becomes worse while bending, reaching up, or with twisting movements.  Pain that may start gradually and then get worse as time passes. It may also start after a major or minor injury.  Numbness or tingling in the arms or legs.  How is this diagnosed? Your health care provider will ask you about your symptoms and about activities or habits that may cause the pain. He or she may also ask about any injuries, diseases, or treatments you have had. Your health care provider will examine you to check for the range of movement that is possible in the affected area, to check for strength in your extremities, and to check for sensation in the areas of the arms and legs supplied by different nerve roots. You may also have:  An X-ray of the spine.  Other imaging tests, such as MRI.  How is this treated? Your health care provider will advise you on the best plan for treatment. Treatment may include:  Medicines.  Rehabilitation exercises.  Follow these instructions at home:  Follow proper lifting and walking techniques as advised by your health care provider.  Maintain good posture.  Exercise regularly as advised by your health care provider.  Perform relaxation exercises.  Change your sitting, standing, and sleeping habits as advised by your health care provider.  Change positions frequently.  Lose weight or maintain a healthy weight as advised by your health care provider.  Do not use any tobacco products, including cigarettes, chewing tobacco, or electronic cigarettes. If you need help quitting, ask your health care provider.  Wear supportive footwear.  Take medicines only as directed by your health care provider. Contact a health care provider if:  Your pain does not go away within 1-4 weeks.  You have significant appetite or weight loss. Get help right away if:  Your pain is severe.  You notice weakness in your arms, hands, or legs.  You begin to  lose control of your bladder or bowel movements.  You have fevers or night sweats. This information is not intended to replace advice given to you by your health care provider. Make sure you discuss any questions you have with your health care provider. Document Released: 06/25/2007 Document Revised: 02/03/2016 Document Reviewed: 12/30/2013 Elsevier Interactive Patient Education  2018 [place] therapy can help ease sore, stiff, injured, and tight muscles and joints. Heat relaxes your muscles, which may help ease your pain. Heat therapy should only be used on old, pre-existing, or long-lasting (  chronic) injuries. Do not use heat therapy unless told by your doctor. How to use heat therapy There are several different kinds of heat therapy, including:  Moist heat pack.  Warm water bath.  Hot water bottle.  Electric heating pad.  Heated gel pack.  Heated wrap.  Electric heating pad.  General heat therapy recommendations  Do not sleep while using heat therapy. Only use heat therapy while you are awake.  Your skin may turn pink while using heat therapy. Do not use heat therapy if your skin turns red.  Do not use heat therapy if you have new pain.  High heat or long exposure to heat can cause burns. Be careful when using heat therapy to avoid burning your skin.  Do not use heat therapy on areas of your skin that are already irritated, such as with a rash or sunburn. Get help if:  You have blisters, redness, swelling (puffiness), or numbness.  You have new pain.  Your pain is worse. This information is not intended to replace advice given to you by your health care provider. Make sure you discuss any questions you have with your health care provider. Document Released: 11/20/2011 Document Revised: 02/03/2016 Document Reviewed: 10/21/2013 Elsevier Interactive Patient Education  [employer].

## 2018-05-21 NOTE — Progress Notes (Signed)
Patient ID: Rebekah Gutierrez, female    DOB: 06-03-1946, 72 y.o.   MRN: [id number]  PCP: [name], MD  Chief Complaint  Patient presents with  . Hip/ Leg Pain    x2 months- B hip pain that radiates down to leg- states that R hip is worse    Subjective:   Rebekah Janus B. Leonhardt is a 72 y.o. female, presents to clinic with CC of bilateral hip pain for 1-2 months.   Hip Pain   The incident occurred more than 1 week ago (~ 2 months ago). Incident location: no injury or strain, past surgeries. There was no injury mechanism. The pain is present in the right hip and left hip. The quality of the pain is described as burning and aching. The pain is at a severity of 8/10. The pain is severe. The pain has been constant since onset. Pertinent negatives include no inability to bear weight, loss of motion, loss of sensation, muscle weakness, numbness or tingling. The symptoms are aggravated by movement and weight bearing (feels stiff after sitting for a while and then getting up to move again, also worse first thing in the morning). Treatments tried: elevating legs helps a little bit. The treatment provided mild relief.  Back Pain  This is a recurrent problem. The current episode started more than 1 year ago. The problem occurs constantly. The problem has been gradually worsening since onset. The pain is present in the gluteal, sacro-iliac and lumbar spine. The quality of the pain is described as burning and aching. The pain is at a severity of 8/10. The symptoms are aggravated by lying down. Stiffness is present in the morning and at night. Associated symptoms include leg pain. Pertinent negatives include no abdominal pain, bladder incontinence, bowel incontinence, chest pain, dysuria, fever, headaches, numbness, paresis, paresthesias, pelvic pain, perianal numbness, tingling, weakness or weight loss.   Having associated intermittent muscle cramps in calves and toes when resting  Pain is everywhere,  bilaterally to Low back pain and buttock and hips, radiates down right leg more so than left  For pain 8/10 - its been really bad a couple of times   Neurosurgery to lumbar spine with infusions - in 2011 two past procedures  Patient Active Problem List   Diagnosis Date Noted  . Multiple lung nodules on CT 04/30/2017  . Spinal stenosis of lumbar region 05/02/2016  . DDD (degenerative disc disease), lumbar 05/02/2016  . Constipation 06/15/2015  . OAB (overactive bladder) 11/25/2014  . Pain in the chest 09/02/2014  . Nonspecific abnormal electrocardiogram (ECG) (EKG) 09/02/2014  . Chest pain at rest 09/02/2014  . Dizziness   . Type 2 diabetes, diet controlled ([place]) 01/21/2014  . Fecal incontinence alternating with constipation 01/21/2014  . B12 deficiency 02/06/2013  . Shoulder pain 08/23/2012  . Encounter for screening colonoscopy 06/05/2012  . Leg cramps 04/29/2012  . Vertigo, benign positional 01/28/2012  . Dyspepsia 11/29/2011  . Insomnia 11/29/2011  . Tobacco user 11/29/2011  . Essential hypertension, benign 11/27/2011     Prior to Admission medications   Medication Sig Start Date End Date Taking? Authorizing Provider  albuterol (PROVENTIL HFA;VENTOLIN HFA) 108 (90 Base) MCG/ACT inhaler Inhale 2 puffs into the lungs every 4 (four) hours as needed for wheezing or shortness of breath. 04/23/17  Yes Caryl Ada K, PA-C  amLODipine (NORVASC) 10 MG tablet Take 1 tablet (10 mg total) by mouth daily. 11/13/17  Yes [place], Modena Nunnery, MD  aspirin 325 MG  EC tablet Take 325 mg by mouth daily.   Yes [provider]  calcium carbonate (OS-CAL) 600 MG TABS Take 600 mg by mouth 2 (two) times daily with a meal.   Yes [provider]  Cinnamon 500 MG TABS Take 1 tablet by mouth at bedtime.    Yes [provider]  esomeprazole (NEXIUM) 20 MG capsule Take 1 capsule (20 mg total) by mouth daily at 12 noon. 06/30/16  Yes [place], Lonell Grandchild F, MD  glucose blood test strip 1  each by Other route 2 (two) times daily. Use as instructed 06/26/17  Yes [place], Modena Nunnery, MD  ibuprofen (ADVIL,MOTRIN) 200 MG tablet Take 600 mg by mouth every 6 (six) hours as needed for pain.   Yes [provider]  lisinopril (PRINIVIL,ZESTRIL) 20 MG tablet Take 1 tablet (20 mg total) by mouth daily. 11/13/17  Yes [place], Modena Nunnery, MD  Multiple Vitamin (MULTIVITAMIN WITH MINERALS) TABS tablet Take 1 tablet by mouth daily.   Yes [provider]  [facility] DELICA LANCETS 87F MISC 1 each by Does not apply route 2 (two) times daily. 06/26/17  Yes [place], Modena Nunnery, MD  polycarbophil (FIBERCON) 625 MG tablet Take 2 tablets (1,250 mg total) by mouth daily. 06/16/15  Yes [place], Modena Nunnery, MD  potassium chloride SA (K-DUR,KLOR-CON) 20 MEQ tablet Take 1 tablet (20 mEq total) by mouth 2 (two) times daily. 04/30/17  Yes [place], Modena Nunnery, MD  rOPINIRole (REQUIP) 0.25 MG tablet Take 1-2 tablets (0.25-0.5 mg total) by mouth at bedtime. 10/16/16  Yes [place], Modena Nunnery, MD     No Known Allergies   Family History  Problem Relation Age of Onset  . Hypertension Mother   . Stroke Mother   . Hypertension Father   . Cancer Father   . Colon cancer Neg Hx      Social History   Socioeconomic History  . Marital status: Divorced    Spouse name: Not on file  . Number of children: Not on file  . Years of education: Not on file  . Highest education level: Not on file  Occupational History  . Not on file  Social Needs  . Financial resource strain: Not on file  . Food insecurity:    Worry: Not on file    Inability: Not on file  . Transportation needs:    Medical: Not on file    Non-medical: Not on file  Tobacco Use  . Smoking status: Current Every Day Smoker    Packs/day: 0.50    Years: 45.00    Pack years: 22.50    Types: Cigarettes  . Smokeless tobacco: Never Used  . Tobacco comment: 1/2 pack a day since age 76  Substance and Sexual Activity  . Alcohol use: No    Alcohol/week:  0.0 standard drinks  . Drug use: No  . Sexual activity: Yes    Birth control/protection: Post-menopausal, Surgical  Lifestyle  . Physical activity:    Days per week: Not on file    Minutes per session: Not on file  . Stress: Not on file  Relationships  . Social connections:    Talks on phone: Not on file    Gets together: Not on file    Attends religious service: Not on file    Active member of club or organization: Not on file    Attends meetings of clubs or organizations: Not on file    Relationship status: Not on file  . Intimate partner violence:  Fear of current or ex partner: Not on file    Emotionally abused: Not on file    Physically abused: Not on file    Forced sexual activity: Not on file  Other Topics Concern  . Not on file  Social History Narrative  . Not on file     Review of Systems  Constitutional: Negative.  Negative for fever and weight loss.  HENT: Negative.   Eyes: Negative.   Respiratory: Negative.   Cardiovascular: Negative.  Negative for chest pain.  Gastrointestinal: Negative.  Negative for abdominal pain and bowel incontinence.  Endocrine: Negative.   Genitourinary: Negative.  Negative for bladder incontinence, dysuria and pelvic pain.  Musculoskeletal: Positive for back pain.  Skin: Negative.   Allergic/Immunologic: Negative.   Neurological: Negative.  Negative for tingling, weakness, numbness, headaches and paresthesias.  Hematological: Negative.   Psychiatric/Behavioral: Negative.   All other systems reviewed and are negative.      Objective:    Vitals:   05/21/18 1417  BP: 122/70  Pulse: 62  Resp: 14  Temp: 98.7 F (37.1 C)  TempSrc: Oral  SpO2: 97%  Weight: 154 lb (69.9 kg)  Height: 5' (1.524 m)      Physical Exam  Constitutional: She is oriented to person, place, and time. She appears well-developed and well-nourished. No distress.  HENT:  Head: Normocephalic and atraumatic.  Nose: Nose normal.  Mouth/Throat:  Oropharynx is clear and moist.  Eyes: Conjunctivae are normal. Right eye exhibits no discharge. Left eye exhibits no discharge.  Neck: No tracheal deviation present.  Cardiovascular: Normal rate, regular rhythm, normal heart sounds and intact distal pulses. Exam reveals no gallop and no friction rub.  No murmur heard. Pulmonary/Chest: Effort normal and breath sounds normal. No stridor. No respiratory distress.  Abdominal: Soft. Bowel sounds are normal. She exhibits no distension. There is no tenderness.  Musculoskeletal: Normal range of motion.       Right hip: She exhibits normal range of motion, normal strength, no tenderness, no bony tenderness, no swelling and no crepitus.       Left hip: She exhibits normal range of motion, normal strength, no tenderness, no bony tenderness, no swelling and no crepitus.       Lumbar back: She exhibits tenderness. She exhibits normal range of motion, no bony tenderness and no spasm.  Mild tenderness to palpation to bilateral paraspinal muscles lumbar sacral region and over bilateral SI joints Normalities palpated or elicited with examination of bilateral hips Patient can sit and stand without any difficulty, normal ambulation and gait  Neurological: She is alert and oriented to person, place, and time. She has normal strength. No sensory deficit. She exhibits normal muscle tone. Coordination and gait normal.  Normal sensation to light touch in bilateral lower extremities 5/5 bilaterally with dorsiflexion, plantarflexion, flexion extension of the knees.  Skin: Skin is warm and dry. Capillary refill takes less than 2 seconds. No rash noted. She is not diaphoretic.  Psychiatric: She has a normal mood and affect. Her behavior is normal.  Nursing note and vitals reviewed.         Assessment & Plan:      ICD-10-CM   1. Bilateral hip pain M25.551 DG Lumbar Spine Complete   M25.552 DG HIPS BILAT WITH PELVIS 3-4 VIEWS    Ambulatory referral to Neurosurgery    2. Bilateral low back pain with right-sided sciatica, unspecified chronicity M54.41 DG Lumbar Spine Complete    DG HIPS BILAT WITH PELVIS 3-4 VIEWS  Ambulatory referral to Neurosurgery  3. DDD (degenerative disc disease), lumbar M51.36 Ambulatory referral to Neurosurgery  4. Spinal stenosis of lumbar region, unspecified whether neurogenic claudication present M48.061 Ambulatory referral to Neurosurgery    Acute on chronic pain to bilateral hips, low back and SI joints with radiation down right side the left, history of degenerative disc disease and multiple spinal surgeries.  I am grossly normal except for mild non-bony tenderness, no neuro deficits. X-ray imaging to evaluate, steroid taper and muscle relaxers, when she is on a steroid she can start her anti-inflammatories as tolerated. With surgical history and with 2 months of pain prior to the office visit feel that she may need to see neurosurgery again.  Referral ordered.   Delsa Grana, PA-C 05/21/18 2:46 PM

## 2018-06-17 ENCOUNTER — Encounter: Payer: Self-pay | Admitting: *Deleted

## 2018-07-01 ENCOUNTER — Other Ambulatory Visit: Payer: Self-pay | Admitting: *Deleted

## 2018-07-04 ENCOUNTER — Other Ambulatory Visit: Payer: Self-pay | Admitting: *Deleted

## 2018-07-04 DIAGNOSIS — R69 Illness, unspecified: Secondary | ICD-10-CM | POA: Diagnosis not present

## 2018-07-04 MED ORDER — GLUCOSE BLOOD VI STRP: ORAL_STRIP | 4 refills | Status: DC

## 2018-07-04 MED ORDER — ONETOUCH DELICA LANCETS 33G MISC: 4 refills | Status: DC

## 2018-07-04 MED ORDER — ROPINIROLE HCL 0.25 MG PO TABS: 0.2500 mg | ORAL_TABLET | Freq: Every day | ORAL | 3 refills | Status: DC

## 2018-07-31 DIAGNOSIS — R69 Illness, unspecified: Secondary | ICD-10-CM | POA: Diagnosis not present

## 2018-08-05 ENCOUNTER — Encounter: Payer: Self-pay | Admitting: Family Medicine

## 2018-08-22 DIAGNOSIS — H401112 Primary open-angle glaucoma, right eye, moderate stage: Secondary | ICD-10-CM | POA: Diagnosis not present

## 2018-08-22 DIAGNOSIS — H43811 Vitreous degeneration, right eye: Secondary | ICD-10-CM | POA: Diagnosis not present

## 2018-08-22 DIAGNOSIS — H26493 Other secondary cataract, bilateral: Secondary | ICD-10-CM | POA: Diagnosis not present

## 2018-09-21 DIAGNOSIS — R69 Illness, unspecified: Secondary | ICD-10-CM | POA: Diagnosis not present

## 2018-10-16 DIAGNOSIS — Z0100 Encounter for examination of eyes and vision without abnormal findings: Secondary | ICD-10-CM | POA: Diagnosis not present

## 2018-11-07 ENCOUNTER — Other Ambulatory Visit: Payer: Self-pay | Admitting: Family Medicine

## 2018-11-07 DIAGNOSIS — H401112 Primary open-angle glaucoma, right eye, moderate stage: Secondary | ICD-10-CM | POA: Diagnosis not present

## 2018-12-05 DIAGNOSIS — H40052 Ocular hypertension, left eye: Secondary | ICD-10-CM | POA: Diagnosis not present

## 2018-12-05 DIAGNOSIS — H401112 Primary open-angle glaucoma, right eye, moderate stage: Secondary | ICD-10-CM | POA: Diagnosis not present

## 2018-12-05 DIAGNOSIS — Z961 Presence of intraocular lens: Secondary | ICD-10-CM | POA: Diagnosis not present

## 2019-01-07 ENCOUNTER — Other Ambulatory Visit: Payer: Self-pay | Admitting: *Deleted

## 2019-01-07 MED ORDER — METHOCARBAMOL 500 MG PO TABS: 500.0000 mg | ORAL_TABLET | Freq: Three times a day (TID) | ORAL | 1 refills | Status: DC | PRN

## 2019-01-07 NOTE — Telephone Encounter (Signed)
Received call from patient.   Requested refill on Robaxin.   Ok to refill??

## 2019-01-28 ENCOUNTER — Emergency Department (HOSPITAL_COMMUNITY)
Admission: EM | Admit: 2019-01-28 | Discharge: 2019-01-28 | Disposition: A | Discharge status: Home / Self Care | Payer: Medicare HMO | Attending: Emergency Medicine | Admitting: Emergency Medicine

## 2019-01-28 ENCOUNTER — Telehealth: Payer: Self-pay | Admitting: *Deleted

## 2019-01-28 ENCOUNTER — Encounter (HOSPITAL_COMMUNITY): Payer: Self-pay | Admitting: Emergency Medicine

## 2019-01-28 ENCOUNTER — Other Ambulatory Visit: Payer: Self-pay

## 2019-01-28 DIAGNOSIS — E119 Type 2 diabetes mellitus without complications: Secondary | ICD-10-CM | POA: Diagnosis not present

## 2019-01-28 DIAGNOSIS — F17210 Nicotine dependence, cigarettes, uncomplicated: Secondary | ICD-10-CM | POA: Insufficient documentation

## 2019-01-28 DIAGNOSIS — G8929 Other chronic pain: Secondary | ICD-10-CM | POA: Insufficient documentation

## 2019-01-28 DIAGNOSIS — M545 Low back pain: Secondary | ICD-10-CM | POA: Insufficient documentation

## 2019-01-28 DIAGNOSIS — M25552 Pain in left hip: Secondary | ICD-10-CM | POA: Insufficient documentation

## 2019-01-28 DIAGNOSIS — Z7982 Long term (current) use of aspirin: Secondary | ICD-10-CM | POA: Diagnosis not present

## 2019-01-28 DIAGNOSIS — I10 Essential (primary) hypertension: Secondary | ICD-10-CM | POA: Diagnosis not present

## 2019-01-28 DIAGNOSIS — Z79899 Other long term (current) drug therapy: Secondary | ICD-10-CM | POA: Diagnosis not present

## 2019-01-28 DIAGNOSIS — M25551 Pain in right hip: Secondary | ICD-10-CM | POA: Diagnosis not present

## 2019-01-28 DIAGNOSIS — R69 Illness, unspecified: Secondary | ICD-10-CM | POA: Diagnosis not present

## 2019-01-28 MED ORDER — PREDNISONE 10 MG PO TABS: 40.0000 mg | ORAL_TABLET | Freq: Every day | ORAL | 0 refills | Status: DC

## 2019-01-28 MED ORDER — PREDNISONE 50 MG PO TABS
60.0000 mg | ORAL_TABLET | Freq: Once | ORAL | Status: AC
  Administered 2019-01-28: 60 mg via ORAL
  Filled 2019-01-28: qty 1

## 2019-01-28 MED ORDER — CYCLOBENZAPRINE HCL 10 MG PO TABS: 10.0000 mg | ORAL_TABLET | Freq: Two times a day (BID) | ORAL | 0 refills | Status: DC | PRN

## 2019-01-28 NOTE — ED Provider Notes (Signed)
[facility] EMERGENCY DEPARTMENT Provider Note   CSN: [id number] Arrival date & time: 01/28/19  1159    History   Chief Complaint Chief Complaint  Patient presents with  . Hip Pain  . Back Pain    HPI Baker Janus B. Bily is a 73 y.o. female.     Patient with complaint of bilateral low back pain radiates into both thighs.  No weakness or numbness to the feet.  Also bilateral hip pain.  Patient seen by chart review by her primary care doctor for same findings in September 2019 had x-rays of lumbar back and bilateral hips with some arthritic changes but no significant abnormalities.  Patient states that this really aggravated itself the last 2 weeks.  Patient still works.  Patient does have a history of type 2 diabetes.  She is diet controlled for that.  Does have some spinal stenosis of the lumbar region.  Degenerative disease in the lumbar region.  Patient states that in the past it was helped with muscle relaxers.  And was asking about steroids today.  Patient without fever or any upper respiratory symptoms.  [facility]     Past Medical History:  Diagnosis Date  . Diabetes ([place])   . GERD (gastroesophageal reflux disease)   . Hiatal hernia   . Hypertension   . Vertigo     Patient Active Problem List   Diagnosis Date Noted  . Multiple lung nodules on CT 04/30/2017  . Spinal stenosis of lumbar region 05/02/2016  . DDD (degenerative disc disease), lumbar 05/02/2016  . Constipation 06/15/2015  . OAB (overactive bladder) 11/25/2014  . Pain in the chest 09/02/2014  . Nonspecific abnormal electrocardiogram (ECG) (EKG) 09/02/2014  . Chest pain at rest 09/02/2014  . Dizziness   . Type 2 diabetes, diet controlled ([place]) 01/21/2014  . Fecal incontinence alternating with constipation 01/21/2014  . B12 deficiency 02/06/2013  . Shoulder pain 08/23/2012  . Encounter for screening colonoscopy 06/05/2012  . Leg cramps 04/29/2012  . Vertigo, benign  positional 01/28/2012  . Dyspepsia 11/29/2011  . Insomnia 11/29/2011  . Tobacco user 11/29/2011  . Essential hypertension, benign 11/27/2011    Past Surgical History:  Procedure Laterality Date  . BACK SURGERY    . BLADDER SURGERY    . CERVICAL SPINE SURGERY     fusion  . COLONOSCOPY  06/20/2012   Procedure: COLONOSCOPY;  Surgeon: Rogene Houston, MD;  Location: [facility];  Service: Endoscopy;  Laterality: N/A;  250  . FOOT SURGERY     left   . HAND SURGERY     left  . TUBAL LIGATION       OB History   No obstetric history on file.      Home Medications    Prior to Admission medications   Medication Sig Start Date End Date Taking? Authorizing Provider  albuterol (PROVENTIL HFA;VENTOLIN HFA) 108 (90 Base) MCG/ACT inhaler Inhale 2 puffs into the lungs every 4 (four) hours as needed for wheezing or shortness of breath. 04/23/17   Fransico Meadow, PA-C  amLODipine (NORVASC) 10 MG tablet Take 1 tablet by mouth once daily 11/07/18   [name], MD  aspirin 325 MG EC tablet Take 325 mg by mouth daily.    [provider]  calcium carbonate (OS-CAL) 600 MG TABS Take 600 mg by mouth 2 (two) times daily with a meal.    [provider]  Cinnamon 500 MG TABS Take 1  tablet by mouth at bedtime.     [provider]  cyclobenzaprine (FLEXERIL) 10 MG tablet Take 1 tablet (10 mg total) by mouth 2 (two) times daily as needed for muscle spasms. 01/28/19   Fredia Sorrow, MD  esomeprazole (NEXIUM) 20 MG capsule Take 1 capsule (20 mg total) by mouth daily at 12 noon. 06/30/16   [name], MD  glucose blood test strip Please dispense based on patient and insurance preference. Use as directed to monitor FSBS 2x daily. Dx: E11.9. 07/04/18   [name], MD  ibuprofen (ADVIL,MOTRIN) 200 MG tablet Take 600 mg by mouth every 6 (six) hours as needed for pain.    [provider]  lisinopril (PRINIVIL,ZESTRIL) 20 MG tablet Take 1 tablet by mouth  once daily 11/07/18   [place], Modena Nunnery, MD  methocarbamol (ROBAXIN) 500 MG tablet Take 1 tablet (500 mg total) by mouth every 8 (eight) hours as needed for muscle spasms. 01/07/19   [name], MD  Multiple Vitamin (MULTIVITAMIN WITH MINERALS) TABS tablet Take 1 tablet by mouth daily.    [provider]  Jonetta Speak LANCETS 38G MISC Please dispense based on patient and insurance preference. Use as directed to monitor FSBS 2x daily. Dx: E11.9. 07/04/18   [name], MD  polycarbophil (FIBERCON) 625 MG tablet Take 2 tablets (1,250 mg total) by mouth daily. 06/16/15   [place], Modena Nunnery, MD  potassium chloride SA (K-DUR,KLOR-CON) 20 MEQ tablet Take 1 tablet (20 mEq total) by mouth 2 (two) times daily. 04/30/17   [name], MD  predniSONE (DELTASONE) 10 MG tablet Take 4 tablets (40 mg total) by mouth daily. 01/28/19   Fredia Sorrow, MD  predniSONE (DELTASONE) 20 MG tablet 2 tabs poqday 1-3, 1 tabs poqday 4-6 05/21/18   Delsa Grana, PA-C  rOPINIRole (REQUIP) 0.25 MG tablet Take 1-2 tablets (0.25-0.5 mg total) by mouth at bedtime. 07/04/18   [name], MD    Family History Family History  Problem Relation Age of Onset  . Hypertension Mother   . Stroke Mother   . Hypertension Father   . Cancer Father   . Colon cancer Neg Hx     Social History Social History   Tobacco Use  . Smoking status: Current Every Day Smoker    Packs/day: 0.50    Years: 45.00    Pack years: 22.50    Types: Cigarettes  . Smokeless tobacco: Never Used  . Tobacco comment: 1/2 pack a day since age 30  Substance Use Topics  . Alcohol use: No    Alcohol/week: 0.0 standard drinks  . Drug use: No     Allergies   Patient has no known allergies.   Review of Systems Review of Systems  Constitutional: Negative for chills and fever.  HENT: Negative for congestion, rhinorrhea and sore throat.   Eyes: Negative for visual disturbance.  Respiratory: Negative for cough and  shortness of breath.   Cardiovascular: Negative for chest pain and leg swelling.  Gastrointestinal: Negative for abdominal pain, diarrhea, nausea and vomiting.  Genitourinary: Negative for dysuria.  Musculoskeletal: Positive for back pain. Negative for joint swelling and neck pain.  Skin: Negative for rash.  Neurological: Negative for dizziness, weakness, light-headedness, numbness and headaches.  Hematological: Does not bruise/bleed easily.  Psychiatric/Behavioral: Negative for confusion.     Physical Exam Updated Vital Signs BP (!) 153/97   Pulse 87   Temp 98.8 F (37.1 C) (Oral)   Resp 18  Ht 1.524 m (5')   Wt 69.4 kg   SpO2 100%   BMI 29.88 kg/m   Physical Exam Vitals signs and nursing note reviewed.  Constitutional:      General: She is not in acute distress.    Appearance: Normal appearance. She is well-developed.  HENT:     Head: Normocephalic and atraumatic.  Eyes:     Extraocular Movements: Extraocular movements intact.     Conjunctiva/sclera: Conjunctivae normal.     Pupils: Pupils are equal, round, and reactive to light.  Neck:     Musculoskeletal: Normal range of motion and neck supple.  Cardiovascular:     Rate and Rhythm: Normal rate and regular rhythm.     Heart sounds: Normal heart sounds. No murmur.  Pulmonary:     Effort: Pulmonary effort is normal. No respiratory distress.     Breath sounds: Normal breath sounds.  Abdominal:     General: Bowel sounds are normal.     Palpations: Abdomen is soft.     Tenderness: There is no abdominal tenderness.  Musculoskeletal: Normal range of motion.        General: No swelling or tenderness.     Comments: Patient with good strength to bilateral lower extremities excellent leg raise.  And also flexion and extension of the foot toes and ankle.  No significant tenderness to palpation the lumbar back area.  Skin:    General: Skin is warm and dry.     Capillary Refill: Capillary refill takes less than 2 seconds.   Neurological:     General: No focal deficit present.     Mental Status: She is alert and oriented to person, place, and time.     Cranial Nerves: No cranial nerve deficit.     Sensory: No sensory deficit.     Motor: No weakness.      ED Treatments / Results  Labs (all labs ordered are listed, but only abnormal results are displayed) Labs Reviewed - No data to display  EKG None  Radiology No results found.  Procedures Procedures (including critical care time)  Medications Ordered in ED Medications  predniSONE (DELTASONE) tablet 60 mg (60 mg Oral Given 01/28/19 1245)     Initial Impression / Assessment and Plan / ED Course  I have reviewed the triage vital signs and the nursing notes.  Pertinent labs & imaging results that were available during my care of the patient were reviewed by me and considered in my medical decision making (see chart for details).          Final Clinical Impressions(s) / ED Diagnoses   Final diagnoses:  Chronic bilateral low back pain without sciatica  Pain of both hip joints    ED Discharge Orders         Ordered    predniSONE (DELTASONE) 10 MG tablet  Daily     01/28/19 1240    cyclobenzaprine (FLEXERIL) 10 MG tablet  2 times daily PRN     01/28/19 1240           Fredia Sorrow, MD 01/28/19 1257

## 2019-01-28 NOTE — ED Triage Notes (Signed)
Pt c/o of bilateral hip and lower back pain x 1 month. Ambulated to room

## 2019-01-28 NOTE — Discharge Instructions (Signed)
Take the prednisone as directed.  Follow your blood sugars on a daily basis because this may increase them.  If you have trouble your blood sugars being over 300 you need to get seen.  Take the Flexeril as needed.  Rest work note provided.  To be out of work for 1 week.  Call and set up an appointment with [facility] your primary care office for recheck in about 2 weeks.

## 2019-01-28 NOTE — Telephone Encounter (Signed)
Call placed to patient and patient made aware.   Appointment scheduled.  

## 2019-01-28 NOTE — Telephone Encounter (Signed)
Received VM from patient.    Reports that she has left several messages about her sciatica pain with no return call. States that she is currently going to ER for evaluation.   Of note, writer did receive VM on 01/27/2019 from unknown caller about sciatica with no name or return number listed.   Noted that patient is currently admitted to ER. Call placed to patient and LM on VM to wait to be evaluated by ER MD.

## 2019-01-28 NOTE — Telephone Encounter (Signed)
Get OV for Next week, she should take the prednisone given by the ER  May need MRI

## 2019-02-04 ENCOUNTER — Other Ambulatory Visit: Payer: Self-pay

## 2019-02-04 ENCOUNTER — Encounter: Payer: Self-pay | Admitting: Family Medicine

## 2019-02-04 ENCOUNTER — Ambulatory Visit (INDEPENDENT_AMBULATORY_CARE_PROVIDER_SITE_OTHER): Payer: Medicare HMO | Admitting: Family Medicine

## 2019-02-04 VITALS — BP 160/96 | HR 88 | Temp 99.3°F | Resp 18 | Ht 60.0 in | Wt 152.8 lb

## 2019-02-04 DIAGNOSIS — E119 Type 2 diabetes mellitus without complications: Secondary | ICD-10-CM

## 2019-02-04 DIAGNOSIS — M48061 Spinal stenosis, lumbar region without neurogenic claudication: Secondary | ICD-10-CM | POA: Diagnosis not present

## 2019-02-04 DIAGNOSIS — M5136 Other intervertebral disc degeneration, lumbar region: Secondary | ICD-10-CM

## 2019-02-04 DIAGNOSIS — I10 Essential (primary) hypertension: Secondary | ICD-10-CM | POA: Diagnosis not present

## 2019-02-04 MED ORDER — LISINOPRIL 20 MG PO TABS: 40.0000 mg | ORAL_TABLET | Freq: Every day | ORAL | 0 refills | Status: DC

## 2019-02-04 MED ORDER — GABAPENTIN 100 MG PO CAPS: ORAL_CAPSULE | ORAL | 3 refills | Status: DC

## 2019-02-04 NOTE — Assessment & Plan Note (Signed)
Blood pressure is uncontrolled increase lisinopril to 40 mg once a day.  Check Renal function.

## 2019-02-04 NOTE — Assessment & Plan Note (Signed)
Diet controlled.  

## 2019-02-04 NOTE — Assessment & Plan Note (Signed)
Worsening degenerative disc disease with known spinal stenosis history of lumbar fusion.  We will start her on gabapentin to help with nerve irritation she will start with 100 mg can titrate up to 300 mg at bedtime.  She can continue to use the Flexeril as needed.  Will obtain repeat imaging of her lumbar spine concerned that her spinal stenosis has worsened above or below the fusion

## 2019-02-04 NOTE — Progress Notes (Signed)
Subjective:    Patient ID: Rebekah Gutierrez. Rebekah Gutierrez, female    DOB: 1946/02/08, 73 y.o.   MRN: [id number]  Patient presents for Back Pain (Sciatica)   Pt here for ER follow up, seen in ER on 01/28/19.She had back pain  That radiated to both hips and thighs. Has known DDD lumbar spine/Stenosis she did have x-rays done last year which showed minimal arthritis in her hips and her back showed degenerative disc disease.  She does have history of lumbar fusion along with spinal stenosis at L3-L4 her last CT of lumbar spine was in 2017. He weeks her back pain is been worsening she has pain shooting down both sides.  She also has cramping in her legs.  When she was seen at the emergency room no imaging was done she was given Flexeril but she is only taking 2 of them at bedtime because it makes her very sleepy.  She was also given prednisone which she did complete and states that it helped.  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  She has been out of work since her ER visit. Working at [employer] doing custodial work, has been out of work since ER visit   HTN- taking lisinopril and norvasc   Diabetes mellitus diet controlled her home readings which she has records of them with her today show 130-170 fasting   Review Of Systems:  GEN- denies fatigue, fever, weight loss,weakness, recent illness HEENT- denies eye drainage, change in vision, nasal discharge, CVS- denies chest pain, palpitations RESP- denies SOB, cough, wheeze ABD- denies N/V, change in stools, abd pain GU- denies dysuria, hematuria, dribbling, incontinence MSK- + joint pain, muscle aches, injury Neuro- denies headache, dizziness, syncope, seizure activity       Objective:    BP (!) 160/96   Pulse 88   Temp 99.3 F (37.4 C)   Resp 18   Ht 5' (1.524 m)   Wt 152 lb 12.8 oz (69.3 kg)   SpO2 95%   BMI 29.84 kg/m  GEN- NAD, alert and oriented x3 HEENT- PERRL, EOMI, non injected sclera, pink conjunctiva, MMM,  oropharynx clear Neck- Supple, no thyromegaly CVS- RRR, no murmur RESP-CTAB ABD-NABS,soft,NT,ND MSK- TTP lumbar spine, +paraspinal tenderness, +SLR Left side, decreaed ROM Spine Neuro- normal tone LE, sensation grossly intact, antalgic gait, motor 4+/5 bilat  EXT- No edema Pulses- Radial, DP- 2+        Assessment & Plan:      Problem List Items Addressed This Visit      Unprioritized   DDD (degenerative disc disease), lumbar    Worsening degenerative disc disease with known spinal stenosis history of lumbar fusion.  We will start her on gabapentin to help with nerve irritation she will start with 100 mg can titrate up to 300 mg at bedtime.  She can continue to use the Flexeril as needed.  Will obtain repeat imaging of her lumbar spine concerned that her spinal stenosis has worsened above or below the fusion      Relevant Orders   CT Lumbar Spine W Contrast   Essential hypertension, benign - Primary    Blood pressure is uncontrolled increase lisinopril to 40 mg once a day.  Check Renal function.      Relevant Medications   lisinopril (ZESTRIL) 20 MG tablet   Other Relevant Orders   CBC with Differential/Platelet   Comprehensive metabolic panel   Lipid panel   Spinal stenosis of lumbar region  Relevant Orders   CT Lumbar Spine W Contrast   Type 2 diabetes, diet controlled ([place])    Diet-controlled      Relevant Medications   lisinopril (ZESTRIL) 20 MG tablet   Other Relevant Orders   Hemoglobin A1c   Lipid panel      Note: This dictation was prepared with Dragon dictation along with smaller phrase technology. Any transcriptional errors that result from this process are unintentional.

## 2019-02-04 NOTE — Patient Instructions (Addendum)
CT to be done of spine  Referral to spine surgeon  Take 2 of the lisinopril 20mg  tablets  I recommend reducing your hours Take the gabapentin at bedtime, start 1 capsule can go up 3  F/U 11months for Physical  F/U 2 weeks for blood pressure

## 2019-02-05 LAB — COMPREHENSIVE METABOLIC PANEL
AG Ratio: 1.4 (calc) (ref 1.0–2.5)
ALT: 34 U/L — ABNORMAL HIGH (ref 6–29)
AST: 26 U/L (ref 10–35)
Albumin: 4.2 g/dL (ref 3.6–5.1)
Alkaline phosphatase (APISO): 76 U/L (ref 37–153)
BUN/Creatinine Ratio: 18 (calc) (ref 6–22)
BUN: 17 mg/dL (ref 7–25)
CO2: 29 mmol/L (ref 20–32)
Calcium: 10.1 mg/dL (ref 8.6–10.4)
Chloride: 103 mmol/L (ref 98–110)
Creat: 0.94 mg/dL — ABNORMAL HIGH (ref 0.60–0.93)
Globulin: 3.1 g/dL (calc) (ref 1.9–3.7)
Glucose, Bld: 184 mg/dL — ABNORMAL HIGH (ref 65–99)
Potassium: 3.5 mmol/L (ref 3.5–5.3)
Sodium: 142 mmol/L (ref 135–146)
Total Bilirubin: 0.6 mg/dL (ref 0.2–1.2)
Total Protein: 7.3 g/dL (ref 6.1–8.1)

## 2019-02-05 LAB — HEMOGLOBIN A1C
Hgb A1c MFr Bld: 6.9 % of total Hgb — ABNORMAL HIGH (ref <5.7)
Mean Plasma Glucose: 151 (calc)
eAG (mmol/L): 8.4 (calc)

## 2019-02-05 LAB — LIPID PANEL
Cholesterol: 143 mg/dL (ref <200)
HDL: 43 mg/dL — ABNORMAL LOW (ref >=50)
LDL Cholesterol (Calc): 54 mg/dL (calc)
Non-HDL Cholesterol (Calc): 100 mg/dL (calc) (ref <130)
Total CHOL/HDL Ratio: 3.3 (calc) (ref <5.0)
Triglycerides: 379 mg/dL — ABNORMAL HIGH (ref <150)

## 2019-02-05 LAB — CBC WITH DIFFERENTIAL/PLATELET
Absolute Monocytes: 630 cells/uL (ref 200–950)
Basophils Absolute: 34 cells/uL (ref 0–200)
Basophils Relative: 0.4 %
Eosinophils Absolute: 84 cells/uL (ref 15–500)
Eosinophils Relative: 1 %
HCT: 41.9 % (ref 35.0–45.0)
Hemoglobin: 14 g/dL (ref 11.7–15.5)
Lymphs Abs: 3746 cells/uL (ref 850–3900)
MCH: 28.2 pg (ref 27.0–33.0)
MCHC: 33.4 g/dL (ref 32.0–36.0)
MCV: 84.5 fL (ref 80.0–100.0)
MPV: 10.7 fL (ref 7.5–12.5)
Monocytes Relative: 7.5 %
Neutro Abs: 3906 cells/uL (ref 1500–7800)
Neutrophils Relative %: 46.5 %
Platelets: 287 10*3/uL (ref 140–400)
RBC: 4.96 10*6/uL (ref 3.80–5.10)
RDW: 13.3 % (ref 11.0–15.0)
Total Lymphocyte: 44.6 %
WBC: 8.4 10*3/uL (ref 3.8–10.8)

## 2019-02-10 DIAGNOSIS — Z6829 Body mass index (BMI) 29.0-29.9, adult: Secondary | ICD-10-CM | POA: Diagnosis not present

## 2019-02-10 DIAGNOSIS — I10 Essential (primary) hypertension: Secondary | ICD-10-CM | POA: Diagnosis not present

## 2019-02-10 DIAGNOSIS — M5416 Radiculopathy, lumbar region: Secondary | ICD-10-CM | POA: Diagnosis not present

## 2019-02-13 ENCOUNTER — Encounter: Payer: Self-pay | Admitting: Family Medicine

## 2019-02-18 ENCOUNTER — Telehealth: Payer: Self-pay | Admitting: *Deleted

## 2019-02-18 ENCOUNTER — Ambulatory Visit: Payer: Medicare HMO | Admitting: Family Medicine

## 2019-02-18 ENCOUNTER — Encounter: Payer: Self-pay | Admitting: Family Medicine

## 2019-02-18 ENCOUNTER — Other Ambulatory Visit: Payer: Self-pay

## 2019-02-18 ENCOUNTER — Ambulatory Visit (INDEPENDENT_AMBULATORY_CARE_PROVIDER_SITE_OTHER): Payer: Medicare HMO | Admitting: Family Medicine

## 2019-02-18 VITALS — BP 138/82 | HR 83 | Temp 98.4°F | Resp 16 | Ht 60.0 in | Wt 152.1 lb

## 2019-02-18 DIAGNOSIS — E119 Type 2 diabetes mellitus without complications: Secondary | ICD-10-CM

## 2019-02-18 DIAGNOSIS — I10 Essential (primary) hypertension: Secondary | ICD-10-CM | POA: Diagnosis not present

## 2019-02-18 DIAGNOSIS — M5136 Other intervertebral disc degeneration, lumbar region: Secondary | ICD-10-CM | POA: Diagnosis not present

## 2019-02-18 NOTE — Progress Notes (Signed)
   Subjective:    Patient ID: Rebekah Gutierrez. [name], female    DOB: Jul 05, 1946, 73 y.o.   MRN: [id number]  Patient presents for Hypertension  Pt here to f/u chronic medical problems. Seen 2 weeks ago, awaiting MRI of lumbar spine by Neurosurgery, CT I ordered was denied .Taking gabapentin 300mg  at bedtime, cant tell much difference, still very stiff in the mornings, can hardly walk due to pain, No falls  She stopped flexeril. Using biofreeze which helps   Last visit- lisinopril was increased to  40mg  at last visit,   Reviewed recent labs at bedside - A1C was up to  6.9%, TG were elevated   Followed by [facility] , told elevated pressures in her eyes  Review Of Systems:  GEN- denies fatigue, fever, weight loss,weakness, recent illness HEENT- denies eye drainage, change in vision, nasal discharge, CVS- denies chest pain, palpitations RESP- denies SOB, cough, wheeze ABD- denies N/V, change in stools, abd pain GU- denies dysuria, hematuria, dribbling, incontinence MSK- denies joint pain, muscle aches, injury Neuro- denies headache, dizziness, syncope, seizure activity       Objective:    BP 138/82   Pulse 83   Temp 98.4 F (36.9 C) (Oral)   Resp 16   Ht 5' (1.524 m)   Wt 152 lb 2 oz (69 kg)   SpO2 95%   BMI 29.71 kg/m  GEN- NAD, alert and oriented x3 HEENT- PERRL, EOMI, non injected sclera, pink conjunctiva, MMM, oropharynx clear CVS- RRR, no murmur RESP-CTAB EXT- No edema Pulses- Radial,2+        Assessment & Plan:  Plan to recheck her labs in 2 months.   Problem List Items Addressed This Visit      Unprioritized   DDD (degenerative disc disease), lumbar   Essential hypertension, benign    Blood pressure has improved.  She will continue the higher dose of lisinopril along with her amlodipine.  I reviewed her labs in detail.  Her blood sugars are going up she admits that she is eating a lot of junk food and sweets we discussed different options that she  can switch out in order to decrease her carbs and her sweets.  Also to increase her water intake.  Follow-up with neurosurgery MRI is pending for her back.  She will continue the gabapentin at this time she is also okay to use the topical Biofreeze or the equivalent which is helped more than anything.      Type 2 diabetes, diet controlled ([place]) - Primary      Note: This dictation was prepared with Dragon dictation along with smaller phrase technology. Any transcriptional errors that result from this process are unintentional.

## 2019-02-18 NOTE — Patient Instructions (Addendum)
F/U 2 months  Call for MRI

## 2019-02-18 NOTE — Telephone Encounter (Signed)
She can just get OTC, doesn't need script

## 2019-02-18 NOTE — Telephone Encounter (Signed)
Call placed to patient and patient made aware.  

## 2019-02-18 NOTE — Assessment & Plan Note (Signed)
Blood pressure has improved.  She will continue the higher dose of lisinopril along with her amlodipine.  I reviewed her labs in detail.  Her blood sugars are going up she admits that she is eating a lot of junk food and sweets we discussed different options that she can switch out in order to decrease her carbs and her sweets.  Also to increase her water intake.  Follow-up with neurosurgery MRI is pending for her back.  She will continue the gabapentin at this time she is also okay to use the topical Biofreeze or the equivalent which is helped more than anything.

## 2019-02-18 NOTE — Telephone Encounter (Signed)
Received call from patient.   Reports that she is using Fast Freeze. Medication is currently over the counter and is comparable to Biofreeze.

## 2019-02-26 DIAGNOSIS — M5416 Radiculopathy, lumbar region: Secondary | ICD-10-CM | POA: Diagnosis not present

## 2019-03-04 DIAGNOSIS — M5416 Radiculopathy, lumbar region: Secondary | ICD-10-CM | POA: Diagnosis not present

## 2019-03-04 DIAGNOSIS — M48061 Spinal stenosis, lumbar region without neurogenic claudication: Secondary | ICD-10-CM | POA: Diagnosis not present

## 2019-03-04 DIAGNOSIS — M5126 Other intervertebral disc displacement, lumbar region: Secondary | ICD-10-CM | POA: Diagnosis not present

## 2019-03-10 DIAGNOSIS — H401112 Primary open-angle glaucoma, right eye, moderate stage: Secondary | ICD-10-CM | POA: Diagnosis not present

## 2019-03-10 DIAGNOSIS — Z961 Presence of intraocular lens: Secondary | ICD-10-CM | POA: Diagnosis not present

## 2019-03-10 DIAGNOSIS — H40052 Ocular hypertension, left eye: Secondary | ICD-10-CM | POA: Diagnosis not present

## 2019-03-13 DIAGNOSIS — I10 Essential (primary) hypertension: Secondary | ICD-10-CM | POA: Diagnosis not present

## 2019-03-13 DIAGNOSIS — M5416 Radiculopathy, lumbar region: Secondary | ICD-10-CM | POA: Diagnosis not present

## 2019-03-13 DIAGNOSIS — M4316 Spondylolisthesis, lumbar region: Secondary | ICD-10-CM | POA: Diagnosis not present

## 2019-03-17 ENCOUNTER — Telehealth: Payer: Self-pay | Admitting: *Deleted

## 2019-03-17 NOTE — Telephone Encounter (Addendum)
Received call from patient [phone]~ telephone.   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 States that she was advised MD note would cover since she is having financial difficulties.   [facility] [address], [place], [place] [address] [place], manager [phone] telephone [phone]~ fax  Advised patient that if she is having financial hardships, there is usually a form to complete. Patient gave verbal permission to contact apartment manager to discuss.   Call placed to [facility]. Left message on VM to return call.

## 2019-03-18 NOTE — Telephone Encounter (Signed)
Call placed to manager. [place].

## 2019-03-19 NOTE — Telephone Encounter (Signed)
Received call from Kendrick Fries, Freight forwarder with [employer].   Reports that patient had advised manager that she will no longer be working for [employer] under recommendation from PCP.   Noted OV on 02/04/2019, patient was advised to reduce amount of hours worked. Letter sent to manager to review.

## 2019-04-07 DIAGNOSIS — M48062 Spinal stenosis, lumbar region with neurogenic claudication: Secondary | ICD-10-CM | POA: Diagnosis not present

## 2019-04-18 ENCOUNTER — Other Ambulatory Visit: Payer: Self-pay

## 2019-04-21 ENCOUNTER — Encounter: Payer: Self-pay | Admitting: Family Medicine

## 2019-04-21 ENCOUNTER — Other Ambulatory Visit: Payer: Self-pay

## 2019-04-21 ENCOUNTER — Ambulatory Visit (INDEPENDENT_AMBULATORY_CARE_PROVIDER_SITE_OTHER): Payer: Medicare HMO | Admitting: Family Medicine

## 2019-04-21 VITALS — BP 132/68 | HR 94 | Temp 98.9°F | Resp 12 | Ht 60.0 in | Wt 152.0 lb

## 2019-04-21 DIAGNOSIS — I10 Essential (primary) hypertension: Secondary | ICD-10-CM

## 2019-04-21 DIAGNOSIS — E781 Pure hyperglyceridemia: Secondary | ICD-10-CM

## 2019-04-21 DIAGNOSIS — E119 Type 2 diabetes mellitus without complications: Secondary | ICD-10-CM | POA: Diagnosis not present

## 2019-04-21 DIAGNOSIS — M5136 Other intervertebral disc degeneration, lumbar region: Secondary | ICD-10-CM

## 2019-04-21 MED ORDER — LISINOPRIL 40 MG PO TABS: 40.0000 mg | ORAL_TABLET | Freq: Every day | ORAL | 1 refills | Status: DC

## 2019-04-21 MED ORDER — GABAPENTIN 300 MG PO CAPS: ORAL_CAPSULE | ORAL | 2 refills | Status: DC

## 2019-04-21 MED ORDER — FISH OIL 1000 MG PO CAPS: ORAL_CAPSULE | ORAL | 0 refills | Status: AC

## 2019-04-21 NOTE — Progress Notes (Signed)
    Subjective:    Patient ID: Rebekah Gutierrez. Rebekah Gutierrez, female    DOB: May 23, 1946, 73 y.o.   MRN: [id number]  Patient presents for Follow-up (is not fasting)   Pt here to f/u chronic medical problems.    She has been seen by neurosurgery, who discussed surgery, but they discussed injections instead since she doesn't want surgery  She is trying to get Medicaid.   She has not worked since May 15th.   She is taking gabapentin    HTN- doing well on lisinopril 40mg  once a day and norvasc    Hypokalemia- taking potassium OTC    Hyperlipidemia- TG elevated at 379, using fish oil, Elevated   DM- last A1C 6.9% in May, diet controlled   Right eye decreased vision with glaucoma given a new drop by [facility] in [place] but insurance wont cover, she has not heard back from office on what to use  - wearing glasses   Review Of Systems:  GEN- denies fatigue, fever, weight loss,weakness, recent illness HEENT- denies eye drainage, change in vision, nasal discharge, CVS- denies chest pain, palpitations RESP- denies SOB, cough, wheeze ABD- denies N/V, change in stools, abd pain GU- denies dysuria, hematuria, dribbling, incontinence MSK- + joint pain, muscle aches, injury Neuro- denies headache, dizziness, syncope, seizure activity       Objective:    BP 132/68   Pulse 94   Temp 98.9 F (37.2 C) (Oral)   Resp 12   Ht 5' (1.524 m)   Wt 152 lb (68.9 kg)   SpO2 98%   BMI 29.69 kg/m  GEN- NAD, alert and oriented x3 HEENT- PERRL, EOMI, non injected sclera, pink conjunctiva, MMM, oropharynx clear Neck- Supple, no thyromegaly CVS- RRR, no murmur RESP-CTAB ABD-NABS,soft,NT,ND EXT- No edema Pulses- Radial, DP- 2+        Assessment & Plan:      Problem List Items Addressed This Visit      Unprioritized   DDD (degenerative disc disease), lumbar    Injections via Dr. Maryjean Ka which has helped Continue gabapentin      Essential hypertension, benign - Primary    Controlled no  changes Recheck fasting labs for cholesterol at CPE next month Diabetes diet controlled      Relevant Medications   lisinopril (ZESTRIL) 40 MG tablet   Hypertriglyceridemia   Relevant Medications   lisinopril (ZESTRIL) 40 MG tablet   Type 2 diabetes, diet controlled ([facility])   Relevant Medications   lisinopril (ZESTRIL) 40 MG tablet      Note: This dictation was prepared with Dragon dictation along with smaller phrase technology. Any transcriptional errors that result from this process are unintentional.   \

## 2019-04-21 NOTE — Patient Instructions (Signed)
F/u as previous 

## 2019-04-21 NOTE — Assessment & Plan Note (Signed)
Controlled no changes Recheck fasting labs for cholesterol at CPE next month Diabetes diet controlled

## 2019-04-21 NOTE — Assessment & Plan Note (Signed)
Injections via Dr. Maryjean Ka which has helped Continue gabapentin

## 2019-05-05 DIAGNOSIS — M48062 Spinal stenosis, lumbar region with neurogenic claudication: Secondary | ICD-10-CM | POA: Diagnosis not present

## 2019-05-10 ENCOUNTER — Other Ambulatory Visit: Payer: Self-pay | Admitting: Family Medicine

## 2019-05-16 ENCOUNTER — Other Ambulatory Visit: Payer: Self-pay | Admitting: Family Medicine

## 2019-06-02 DIAGNOSIS — Z6829 Body mass index (BMI) 29.0-29.9, adult: Secondary | ICD-10-CM | POA: Diagnosis not present

## 2019-06-02 DIAGNOSIS — M48062 Spinal stenosis, lumbar region with neurogenic claudication: Secondary | ICD-10-CM | POA: Diagnosis not present

## 2019-06-02 DIAGNOSIS — I10 Essential (primary) hypertension: Secondary | ICD-10-CM | POA: Diagnosis not present

## 2019-06-06 ENCOUNTER — Other Ambulatory Visit: Payer: Self-pay

## 2019-06-09 ENCOUNTER — Encounter: Payer: Self-pay | Admitting: Family Medicine

## 2019-06-09 ENCOUNTER — Ambulatory Visit (INDEPENDENT_AMBULATORY_CARE_PROVIDER_SITE_OTHER): Payer: Medicare HMO | Admitting: Family Medicine

## 2019-06-09 ENCOUNTER — Other Ambulatory Visit: Payer: Self-pay

## 2019-06-09 VITALS — BP 124/78 | HR 83 | Temp 98.9°F | Resp 16 | Ht 60.0 in | Wt 154.1 lb

## 2019-06-09 DIAGNOSIS — R918 Other nonspecific abnormal finding of lung field: Secondary | ICD-10-CM

## 2019-06-09 DIAGNOSIS — K5900 Constipation, unspecified: Secondary | ICD-10-CM

## 2019-06-09 DIAGNOSIS — K439 Ventral hernia without obstruction or gangrene: Secondary | ICD-10-CM | POA: Insufficient documentation

## 2019-06-09 DIAGNOSIS — E781 Pure hyperglyceridemia: Secondary | ICD-10-CM

## 2019-06-09 DIAGNOSIS — Z1159 Encounter for screening for other viral diseases: Secondary | ICD-10-CM | POA: Diagnosis not present

## 2019-06-09 DIAGNOSIS — M5136 Other intervertebral disc degeneration, lumbar region: Secondary | ICD-10-CM

## 2019-06-09 DIAGNOSIS — E119 Type 2 diabetes mellitus without complications: Secondary | ICD-10-CM

## 2019-06-09 DIAGNOSIS — I10 Essential (primary) hypertension: Secondary | ICD-10-CM

## 2019-06-09 MED ORDER — LISINOPRIL 40 MG PO TABS: 40.0000 mg | ORAL_TABLET | Freq: Every day | ORAL | 2 refills | Status: DC

## 2019-06-09 MED ORDER — GABAPENTIN 300 MG PO CAPS: ORAL_CAPSULE | ORAL | 2 refills | Status: DC

## 2019-06-09 NOTE — Progress Notes (Signed)
Subjective:   Patient presents for Medicare Annual/Subsequent preventive examination.    Pt here for CPE   Feels like has sharp pains in her right ear no drainage from the ears.  No significant change in her hearing.  No sinus pressure or drainage   Chronic back pain- ran of her gabapentin.  This did help with the pain radiating down her leg.  She has been followed by neurosurgery she has had 2 epidural injections which helped a little.  They did not recommend any repeat surgery on her at this time.    HTN- taking lisinopril 40mg     Constipation- has difficulty passing her bowels is been using over-the-counter fiber supplements she does not want to be on anything else prescription at this time   Mammogram -declines   Ventral hernia, has had for many years , seen surgeon years ago, does not want intervention does not cause any pain    Review Past Medical/Family/Social: Per EMR   Risk Factors  Current exercise habits: walks some  Dietary issues discussed: Yes  Cardiac risk factors: . HTN, arotic atherersclerosis   Depression Screen  (Note: if answer to either of the following is "Yes", a more complete depression screening is indicated)  Over the past two weeks, have you felt down, depressed or hopeless? No Over the past two weeks, have you felt little interest or pleasure in doing things? No Have you lost interest or pleasure in daily life? No Do you often feel hopeless? No Do you cry easily over simple problems? No   Activities of Daily Living  In your present state of health, do you have any difficulty performing the following activities?:  Driving? No  Managing money? No  Feeding yourself? No  Getting from bed to chair? No  Climbing a flight of stairs? Yes  Preparing food and eating?: No  Bathing or showering? No  Getting dressed: No  Getting to the toilet? No  Using the toilet:No  Moving around from place to place: Yes - sometimes   In the past year have you fallen  or had a near fall?:No  Are you sexually active? No  Do you have more than one partner? No   Hearing Difficulties: No  Do you often ask people to speak up or repeat themselves? No  Do you experience ringing or noises in your ears? No Do you have difficulty understanding soft or whispered voices? No  Do you feel that you have a problem with memory? YES, FORgets names  Do you often misplace items? No  Do you feel safe at home? Yes  Cognitive Testing  Alert? Yes Normal Appearance?Yes  Oriented to person? Yes Place? Yes  Time? Yes  Recall of three objects? Yes  Can perform simple calculations? Yes  Displays appropriate judgment?Yes  Can read the correct time from a watch face?Yes   List the Names of Other Physician/Practitioners you currently use:   Neurosurgery   Screening Tests / Date Colonoscopy     UTD                Zostavax Declines Mammogram  Declines  Influenza Vaccine Declines  Tetanus/tdap UTD Pneumonia- declines  ROS:  GEN- denies fatigue, fever, weight loss,weakness, recent illness HEENT- denies eye drainage, change in vision, nasal discharge, CVS- denies chest pain, palpitations RESP- denies SOB, cough, wheeze ABD- denies N/V, change in stools, abd pain GU- denies dysuria, hematuria, dribbling, incontinence MSK- +joint pain, muscle aches, injury Neuro- denies headache, dizziness, syncope, seizure  activity   Physical: vitals reviewed   GEN- NAD, alert and oriented x3 HEENT- PERRL, EOMI, non injected sclera, pink conjunctiva, MMM, oropharynx clear Neck- Supple, no thryomegaly, no bruit  CVS- RRR, no murmur RESP-CTAB ABD-NABS,soft,NT,ND , ventral hernia midline upper abd EXT- very little ankle edema bilat  Pulses- Radial, DP- 2+    Assessment:    Annual wellness medicare exam   Plan:    During the course of the visit the patient was educated and counseled about appropriate screening and preventive services including:  She declines further  preventative medicine at this time. Fasting labs obtained we will also check her A1c and hep C.  Chronic constipation given samples of MiraLAX in the office she can also use fiber 1 over-the-counter.   Hypertension doing well on current blood pressure regimen continue lisinopril and amlodipine.  Chronic back pain declined surgical intervention at this time which is very reasonable based on her age.  We will continue gabapentin I refill this for her.   Discussed advanced directives given handout.   She is a smoker she did have lung nodules in the past she had a chest x-ray which was fairly benign in 2018 has not had any lung imaging since then.  Will discuss getting CT of chest  Aortic atherosclerosis seen in CT scan IN PAST, no current statin drug, check lipid   FALL/DEPRESSION/AUDIT C negative   Vental hernia present for > 15  years, no pain, no surgical intervention needed  Diet review for nutrition referral? Yes ____ Not Indicated __x__  Patient Instructions (the written plan) was given to the patient.  Medicare Attestation  I have personally reviewed:  The patient's medical and social history  Their use of alcohol, tobacco or illicit drugs  Their current medications and supplements  The patient's functional ability including ADLs,fall risks, home safety risks, cognitive, and hearing and visual impairment  Diet and physical activities  Evidence for depression or mood disorders  The patient's weight, height, BMI, and visual acuity have been recorded in the chart. I have made referrals, counseling, and provided education to the patient based on review of the above and I have provided the patient with a written personalized care plan for preventive services.

## 2019-06-09 NOTE — Patient Instructions (Addendum)
We will call with lab results  Try Fiber One or miralax  Restart the gabapentin F/U 6 months

## 2019-06-10 LAB — CBC WITH DIFFERENTIAL/PLATELET
Absolute Monocytes: 600 cells/uL (ref 200–950)
Basophils Absolute: 16 cells/uL (ref 0–200)
Basophils Relative: 0.2 %
Eosinophils Absolute: 48 cells/uL (ref 15–500)
Eosinophils Relative: 0.6 %
HCT: 40.6 % (ref 35.0–45.0)
Hemoglobin: 13.3 g/dL (ref 11.7–15.5)
Lymphs Abs: 4152 cells/uL — ABNORMAL HIGH (ref 850–3900)
MCH: 28.4 pg (ref 27.0–33.0)
MCHC: 32.8 g/dL (ref 32.0–36.0)
MCV: 86.8 fL (ref 80.0–100.0)
MPV: 10.9 fL (ref 7.5–12.5)
Monocytes Relative: 7.5 %
Neutro Abs: 3184 cells/uL (ref 1500–7800)
Neutrophils Relative %: 39.8 %
Platelets: 298 10*3/uL (ref 140–400)
RBC: 4.68 10*6/uL (ref 3.80–5.10)
RDW: 13.8 % (ref 11.0–15.0)
Total Lymphocyte: 51.9 %
WBC: 8 10*3/uL (ref 3.8–10.8)

## 2019-06-10 LAB — HEMOGLOBIN A1C
Hgb A1c MFr Bld: 6.9 % of total Hgb — ABNORMAL HIGH (ref <5.7)
Mean Plasma Glucose: 151 (calc)
eAG (mmol/L): 8.4 (calc)

## 2019-06-10 LAB — LIPID PANEL
Cholesterol: 160 mg/dL (ref <200)
HDL: 41 mg/dL — ABNORMAL LOW (ref >=50)
LDL Cholesterol (Calc): 92 mg/dL (calc)
Non-HDL Cholesterol (Calc): 119 mg/dL (calc) (ref <130)
Total CHOL/HDL Ratio: 3.9 (calc) (ref <5.0)
Triglycerides: 174 mg/dL — ABNORMAL HIGH (ref <150)

## 2019-06-10 LAB — COMPREHENSIVE METABOLIC PANEL
AG Ratio: 1.5 (calc) (ref 1.0–2.5)
ALT: 26 U/L (ref 6–29)
AST: 24 U/L (ref 10–35)
Albumin: 4.3 g/dL (ref 3.6–5.1)
Alkaline phosphatase (APISO): 73 U/L (ref 37–153)
BUN: 15 mg/dL (ref 7–25)
CO2: 24 mmol/L (ref 20–32)
Calcium: 10.1 mg/dL (ref 8.6–10.4)
Chloride: 104 mmol/L (ref 98–110)
Creat: 0.73 mg/dL (ref 0.60–0.93)
Globulin: 2.9 g/dL (calc) (ref 1.9–3.7)
Glucose, Bld: 83 mg/dL (ref 65–99)
Potassium: 3.8 mmol/L (ref 3.5–5.3)
Sodium: 141 mmol/L (ref 135–146)
Total Bilirubin: 0.5 mg/dL (ref 0.2–1.2)
Total Protein: 7.2 g/dL (ref 6.1–8.1)

## 2019-06-10 LAB — HEPATITIS C ANTIBODY
Hepatitis C Ab: NONREACTIVE
SIGNAL TO CUT-OFF: 0.09 (ref <1.00)

## 2019-07-09 ENCOUNTER — Other Ambulatory Visit (HOSPITAL_COMMUNITY): Payer: Self-pay | Admitting: Family Medicine

## 2019-07-09 DIAGNOSIS — Z1231 Encounter for screening mammogram for malignant neoplasm of breast: Secondary | ICD-10-CM

## 2019-07-17 ENCOUNTER — Other Ambulatory Visit: Payer: Self-pay

## 2019-07-17 ENCOUNTER — Ambulatory Visit (HOSPITAL_COMMUNITY)
Admission: RE | Admit: 2019-07-17 | Discharge: 2019-07-17 | Disposition: A | Discharge status: Home / Self Care | Payer: Medicare Other | Source: Ambulatory Visit | Attending: Family Medicine | Admitting: Family Medicine

## 2019-07-17 DIAGNOSIS — Z1231 Encounter for screening mammogram for malignant neoplasm of breast: Secondary | ICD-10-CM | POA: Diagnosis not present

## 2019-07-21 ENCOUNTER — Other Ambulatory Visit: Payer: Self-pay | Admitting: Family Medicine

## 2019-07-24 DIAGNOSIS — H40052 Ocular hypertension, left eye: Secondary | ICD-10-CM | POA: Diagnosis not present

## 2019-07-24 DIAGNOSIS — H179 Unspecified corneal scar and opacity: Secondary | ICD-10-CM | POA: Diagnosis not present

## 2019-07-24 DIAGNOSIS — Z961 Presence of intraocular lens: Secondary | ICD-10-CM | POA: Diagnosis not present

## 2019-07-24 DIAGNOSIS — H401112 Primary open-angle glaucoma, right eye, moderate stage: Secondary | ICD-10-CM | POA: Diagnosis not present

## 2019-08-04 ENCOUNTER — Other Ambulatory Visit: Payer: Self-pay | Admitting: *Deleted

## 2019-08-04 MED ORDER — LISINOPRIL 40 MG PO TABS: 40.0000 mg | ORAL_TABLET | Freq: Every day | ORAL | 2 refills | Status: DC

## 2019-08-18 ENCOUNTER — Ambulatory Visit
Admission: EM | Admit: 2019-08-18 | Discharge: 2019-08-18 | Disposition: A | Discharge status: Home / Self Care | Payer: Medicare Other | Attending: Emergency Medicine | Admitting: Emergency Medicine

## 2019-08-18 ENCOUNTER — Other Ambulatory Visit: Payer: Self-pay

## 2019-08-18 DIAGNOSIS — Z20822 Contact with and (suspected) exposure to covid-19: Secondary | ICD-10-CM

## 2019-08-18 DIAGNOSIS — Z20828 Contact with and (suspected) exposure to other viral communicable diseases: Secondary | ICD-10-CM | POA: Diagnosis not present

## 2019-08-18 NOTE — ED Triage Notes (Signed)
Pt presents to [facility] stating she would like a covid test. Pt denies any exposures or symptoms.

## 2019-08-18 NOTE — Discharge Instructions (Signed)
COVID testing ordered.  It will take between 5-7 days for test results.  Someone will contact you regarding abnormal results.    In the meantime: (if you develop symptoms) You should remain isolated in your home for 10 days from symptom onset AND greater than 72 hours after symptoms resolution (absence of fever without the use of fever-reducing medication and improvement in respiratory symptoms), whichever is longer Get plenty of rest and push fluids Use OTC zyrtec for nasal congestion, runny nose, and/or sore throat Use OTC flonase for nasal congestion and runny nose Use medications daily for symptom relief Use OTC medications like ibuprofen or tylenol as needed fever or pain Follow up with PCP as needed Call or go to the ED if you have any new or worsening symptoms such as fever, cough, shortness of breath, chest tightness, chest pain, turning blue, changes in mental status, etc..Marland Kitchen

## 2019-08-18 NOTE — ED Provider Notes (Signed)
[facility]   [id number] 08/18/19 Arrival Time: [id number]   CC: COVID test  SUBJECTIVE: History from: patient.  [name] Shusterman is a 73 y.o. female who presents for COVID testing.  Denies sick exposure to COVID, flu or strep.  Denies recent travel.  Denies aggravating or alleviating symptoms.  Denies previous COVID infection.   Denies fever, chills, fatigue, nasal congestion, rhinorrhea, sore throat, cough, SOB, wheezing, chest pain, nausea, vomiting, changes in bowel or bladder habits.    ROS: As per HPI.  All other pertinent ROS negative.     Past Medical History:  Diagnosis Date  . Diabetes ([place])   . GERD (gastroesophageal reflux disease)   . Hiatal hernia   . Hypertension   . Vertigo    Past Surgical History:  Procedure Laterality Date  . BACK SURGERY    . BLADDER SURGERY    . CERVICAL SPINE SURGERY     fusion  . COLONOSCOPY  06/20/2012   Procedure: COLONOSCOPY;  Surgeon: Rogene Houston, MD;  Location: [facility];  Service: Endoscopy;  Laterality: N/A;  250  . FOOT SURGERY     left   . HAND SURGERY     left  . TUBAL LIGATION     No Known Allergies No current facility-administered medications on file prior to encounter.    Current Outpatient Medications on File Prior to Encounter  Medication Sig Dispense Refill  . amLODipine (NORVASC) 10 MG tablet Take 1 tablet by mouth once daily 90 tablet 1  . aspirin 325 MG EC tablet Take 325 mg by mouth daily.    . calcium carbonate (OS-CAL) 600 MG TABS Take 600 mg by mouth 2 (two) times daily with a meal.    . Cinnamon 500 MG TABS Take 1 tablet by mouth at bedtime.     Marland Kitchen esomeprazole (NEXIUM) 20 MG capsule Take 1 capsule (20 mg total) by mouth daily at 12 noon. 90 capsule 0  . gabapentin (NEURONTIN) 300 MG capsule Take 1 capsules a day at bedtime for back pain 90 capsule 2  . glucose blood test strip Please dispense based on patient and insurance preference. Use as directed to monitor FSBS 2x daily. Dx: E11.9. 100 each  4  . lisinopril (ZESTRIL) 40 MG tablet Take 1 tablet (40 mg total) by mouth daily. 90 tablet 2  . Multiple Vitamin (MULTIVITAMIN WITH MINERALS) TABS tablet Take 1 tablet by mouth daily.    . Omega-3 Fatty Acids (FISH OIL) 1000 MG CAPS tAKE 1000MG  TWICE A DAY FOR CHOLESTEROL  0  . ONETOUCH DELICA LANCETS [id number] MISC Please dispense based on patient and insurance preference. Use as directed to monitor FSBS 2x daily. Dx: E11.9. 100 each 4  . potassium chloride SA (K-DUR,KLOR-CON) 20 MEQ tablet Take 1 tablet (20 mEq total) by mouth 2 (two) times daily. 180 tablet 2   Social History   Socioeconomic History  . Marital status: Divorced    Spouse name: Not on file  . Number of children: Not on file  . Years of education: Not on file  . Highest education level: Not on file  Occupational History  . Not on file  Social Needs  . Financial resource strain: Not on file  . Food insecurity    Worry: Not on file    Inability: Not on file  . Transportation needs    Medical: Not on file    Non-medical: Not on file  Tobacco Use  . Smoking status: Current Every  Day Smoker    Packs/day: 0.50    Years: 45.00    Pack years: 22.50    Types: Cigarettes  . Smokeless tobacco: Never Used  . Tobacco comment: 1/2 pack a day since age 25  Substance and Sexual Activity  . Alcohol use: No    Alcohol/week: 0.0 standard drinks  . Drug use: No  . Sexual activity: Yes    Birth control/protection: Post-menopausal, Surgical  Lifestyle  . Physical activity    Days per week: Not on file    Minutes per session: Not on file  . Stress: Not on file  Relationships  . Social Herbalist on phone: Not on file    Gets together: Not on file    Attends religious service: Not on file    Active member of club or organization: Not on file    Attends meetings of clubs or organizations: Not on file    Relationship status: Not on file  . Intimate partner violence    Fear of current or ex partner: Not on file     Emotionally abused: Not on file    Physically abused: Not on file    Forced sexual activity: Not on file  Other Topics Concern  . Not on file  Social History Narrative  . Not on file   Family History  Problem Relation Age of Onset  . Hypertension Mother   . Stroke Mother   . Hypertension Father   . Cancer Father   . Colon cancer Neg Hx     OBJECTIVE:  Vitals:   08/18/19 1330  BP: (!) 152/88  Pulse: (!) 112  Resp: 16  Temp: 99 F (37.2 C)  TempSrc: Oral  SpO2: 94%     General appearance: alert; well-appearing, nontoxic; speaking in full sentences and tolerating own secretions HEENT: NCAT; Ears: EACs clear, TMs pearly gray; Eyes: PERRL.  EOM grossly intact. Nose: nares patent without rhinorrhea, Throat: oropharynx clear, tonsils non erythematous or enlarged, uvula midline  Neck: supple without LAD Lungs: unlabored respirations, symmetrical air entry; cough: absent; no respiratory distress; CTAB Heart: regular rate and rhythm.   Skin: warm and dry Psychological: alert and cooperative; normal mood and affect  ASSESSMENT & PLAN:  1. Encounter for laboratory testing for COVID-19 virus    COVID testing ordered.  It will take between 5-7 days for test results.  Someone will contact you regarding abnormal results.    In the meantime: (if you develop symptoms) You should remain isolated in your home for 10 days from symptom onset AND greater than 72 hours after symptoms resolution (absence of fever without the use of fever-reducing medication and improvement in respiratory symptoms), whichever is longer Get plenty of rest and push fluids Use OTC zyrtec for nasal congestion, runny nose, and/or sore throat Use OTC flonase for nasal congestion and runny nose Use medications daily for symptom relief Use OTC medications like ibuprofen or tylenol as needed fever or pain Follow up with PCP as needed Call or go to the ED if you have any new or worsening symptoms such as fever,  cough, shortness of breath, chest tightness, chest pain, turning blue, changes in mental status, etc...   Reviewed expectations re: course of current medical issues. Questions answered. Outlined signs and symptoms indicating need for more acute intervention. Patient verbalized understanding. After Visit Summary given.         Lestine Box, PA-C 08/18/19 1356

## 2019-08-19 LAB — NOVEL CORONAVIRUS, NAA: SARS-CoV-2, NAA: NOT DETECTED

## 2019-08-20 ENCOUNTER — Telehealth: Payer: Self-pay | Admitting: Emergency Medicine

## 2019-08-20 NOTE — Telephone Encounter (Signed)
Patient contacted by phone and made aware of   Neg covid results. Pt verbalized understanding and had all questions answered.

## 2019-08-21 DIAGNOSIS — H401112 Primary open-angle glaucoma, right eye, moderate stage: Secondary | ICD-10-CM | POA: Diagnosis not present

## 2019-08-21 DIAGNOSIS — H40052 Ocular hypertension, left eye: Secondary | ICD-10-CM | POA: Diagnosis not present

## 2019-08-21 DIAGNOSIS — H179 Unspecified corneal scar and opacity: Secondary | ICD-10-CM | POA: Diagnosis not present

## 2019-08-21 DIAGNOSIS — Z961 Presence of intraocular lens: Secondary | ICD-10-CM | POA: Diagnosis not present

## 2019-09-01 DIAGNOSIS — H401112 Primary open-angle glaucoma, right eye, moderate stage: Secondary | ICD-10-CM | POA: Diagnosis not present

## 2019-10-02 DIAGNOSIS — H40052 Ocular hypertension, left eye: Secondary | ICD-10-CM | POA: Diagnosis not present

## 2019-12-08 ENCOUNTER — Ambulatory Visit: Payer: Medicare HMO | Admitting: Family Medicine

## 2019-12-15 ENCOUNTER — Other Ambulatory Visit: Payer: Self-pay | Admitting: Family Medicine

## 2019-12-17 ENCOUNTER — Encounter: Payer: Self-pay | Admitting: Family Medicine

## 2019-12-17 ENCOUNTER — Other Ambulatory Visit: Payer: Self-pay

## 2019-12-17 ENCOUNTER — Ambulatory Visit (INDEPENDENT_AMBULATORY_CARE_PROVIDER_SITE_OTHER): Payer: Medicare Other | Admitting: Family Medicine

## 2019-12-17 VITALS — BP 138/62 | HR 100 | Temp 98.4°F | Resp 16 | Ht 60.0 in | Wt 154.0 lb

## 2019-12-17 DIAGNOSIS — E119 Type 2 diabetes mellitus without complications: Secondary | ICD-10-CM

## 2019-12-17 DIAGNOSIS — Z720 Tobacco use: Secondary | ICD-10-CM

## 2019-12-17 DIAGNOSIS — E781 Pure hyperglyceridemia: Secondary | ICD-10-CM

## 2019-12-17 DIAGNOSIS — M48061 Spinal stenosis, lumbar region without neurogenic claudication: Secondary | ICD-10-CM

## 2019-12-17 DIAGNOSIS — I10 Essential (primary) hypertension: Secondary | ICD-10-CM

## 2019-12-17 DIAGNOSIS — R918 Other nonspecific abnormal finding of lung field: Secondary | ICD-10-CM

## 2019-12-17 MED ORDER — GABAPENTIN 300 MG PO CAPS: ORAL_CAPSULE | ORAL | 2 refills | Status: DC

## 2019-12-17 NOTE — Assessment & Plan Note (Signed)
Increase gabapentin to 300mg BID. 

## 2019-12-17 NOTE — Assessment & Plan Note (Signed)
CT chest to be done Discussed tobacco cessation,she has tried patches, gum, lozenges She will think about trying patches again

## 2019-12-17 NOTE — Patient Instructions (Addendum)
Increase gabapentin to 300mg  twice a day  We will call with lab results CT of chest to be done  F/U 4 months

## 2019-12-17 NOTE — Progress Notes (Signed)
Subjective:    Patient ID: Rebekah Gutierrez. Rebekah Gutierrez, female    DOB: 12/17/1945, 74 y.o.   MRN: [id number]  Patient presents for Follow-up (is not fasting)  Patient here to follow-up chronic medical problems.  Medications and history reviewed   Hypertension doing well on current blood pressure regimen continue lisinopril and amlodipine.  Chronic back pain declined surgical intervention at this time which is very reasonable based on her age.  she is taking gabapentin at bedtime only but it is not helping very much, wanted to change or increase medication No recent falls    She is a smoker she did have lung nodules in the past she had a chest x-ray which was fairly benign in 2018 has not had any lung imaging since then.  Due for repeat CT of chest, continues to smoke 10-12 cig/day           DM type 2 diet controlled, she has readings with her today, past 3 weeks, CBG fasting  have  been going up , fasting 150-223 , no hypoglycemia   she does drink mountain dew, eats cereal most mornings or cinnamon bread, trying to decrease her junk good     States no change in diet before sugars started going up      She is now taking prevagen vitamin    Review Of Systems:  GEN- denies fatigue, fever, weight loss,weakness, recent illness HEENT- denies eye drainage, change in vision, nasal discharge, CVS- denies chest pain, palpitations RESP- denies SOB, cough, wheeze ABD- denies N/V, change in stools, abd pain GU- denies dysuria, hematuria, dribbling, incontinence MSK- + joint pain, muscle aches, injury Neuro- denies headache, dizziness, syncope, seizure activity       Objective:    BP 138/62   Pulse 100   Temp 98.4 F (36.9 C) (Temporal)   Resp 16   Ht 5' (1.524 m)   Wt 154 lb (69.9 kg)   SpO2 98%   BMI 30.08 kg/m  GEN- NAD, alert and oriented x3 HEENT- PERRL, EOMI, non injected sclera, pink conjunctiva, MMM, oropharynx clear Neck- Supple, no thyromegaly CVS- RRR HR 80's, no  murmur RESP-CTAB EXT- No edema Pulses- Radial, DP- 2+        Assessment & Plan:      Problem List Items Addressed This Visit      Unprioritized   Essential hypertension, benign - Primary    Controlled no changes       Relevant Orders   CBC with Differential/Platelet   Comprehensive metabolic panel   Hypertriglyceridemia   Multiple lung nodules on CT    CT chest to be done Discussed tobacco cessation,she has tried patches, gum, lozenges She will think about trying patches again      Relevant Orders   CT Chest Wo Contrast   Spinal stenosis of lumbar region    Increase gabapentin to 300mg  BID      Tobacco user   Relevant Orders   CT Chest Wo Contrast   Type 2 diabetes, diet controlled (HCC)    increasing CBG Discussed cutting down on some of the carb rich foods Check A1C Add protein with breakfast  If A1C above 7 would start oral therapy metformin      Relevant Orders   Hemoglobin A1c   Microalbumin / creatinine urine ratio      Note: This dictation was prepared with Dragon dictation along with smaller phrase technology. Any transcriptional errors that result from this process are unintentional.

## 2019-12-17 NOTE — Assessment & Plan Note (Signed)
Controlled no changes 

## 2019-12-17 NOTE — Assessment & Plan Note (Signed)
increasing CBG Discussed cutting down on some of the carb rich foods Check A1C Add protein with breakfast  If A1C above 7 would start oral therapy metformin

## 2019-12-18 ENCOUNTER — Telehealth: Payer: Self-pay | Admitting: *Deleted

## 2019-12-18 LAB — CBC WITH DIFFERENTIAL/PLATELET
Absolute Monocytes: 491 cells/uL (ref 200–950)
Basophils Absolute: 32 cells/uL (ref 0–200)
Basophils Relative: 0.5 %
Eosinophils Absolute: 82 cells/uL (ref 15–500)
Eosinophils Relative: 1.3 %
HCT: 41.4 % (ref 35.0–45.0)
Hemoglobin: 13.1 g/dL (ref 11.7–15.5)
Lymphs Abs: 2785 cells/uL (ref 850–3900)
MCH: 27.8 pg (ref 27.0–33.0)
MCHC: 31.6 g/dL — ABNORMAL LOW (ref 32.0–36.0)
MCV: 87.7 fL (ref 80.0–100.0)
MPV: 10.9 fL (ref 7.5–12.5)
Monocytes Relative: 7.8 %
Neutro Abs: 2911 cells/uL (ref 1500–7800)
Neutrophils Relative %: 46.2 %
Platelets: 268 10*3/uL (ref 140–400)
RBC: 4.72 10*6/uL (ref 3.80–5.10)
RDW: 12.5 % (ref 11.0–15.0)
Total Lymphocyte: 44.2 %
WBC: 6.3 10*3/uL (ref 3.8–10.8)

## 2019-12-18 LAB — COMPREHENSIVE METABOLIC PANEL
AG Ratio: 1.5 (calc) (ref 1.0–2.5)
ALT: 23 U/L (ref 6–29)
AST: 22 U/L (ref 10–35)
Albumin: 4.2 g/dL (ref 3.6–5.1)
Alkaline phosphatase (APISO): 78 U/L (ref 37–153)
BUN: 15 mg/dL (ref 7–25)
CO2: 27 mmol/L (ref 20–32)
Calcium: 9.6 mg/dL (ref 8.6–10.4)
Chloride: 97 mmol/L — ABNORMAL LOW (ref 98–110)
Creat: 0.91 mg/dL (ref 0.60–0.93)
Globulin: 2.8 g/dL (calc) (ref 1.9–3.7)
Glucose, Bld: 496 mg/dL — ABNORMAL HIGH (ref 65–99)
Potassium: 3.9 mmol/L (ref 3.5–5.3)
Sodium: 135 mmol/L (ref 135–146)
Total Bilirubin: 0.5 mg/dL (ref 0.2–1.2)
Total Protein: 7 g/dL (ref 6.1–8.1)

## 2019-12-18 LAB — HEMOGLOBIN A1C
Hgb A1c MFr Bld: 8.6 % of total Hgb — ABNORMAL HIGH (ref <5.7)
Mean Plasma Glucose: 200 (calc)
eAG (mmol/L): 11.1 (calc)

## 2019-12-18 LAB — MICROALBUMIN / CREATININE URINE RATIO
Creatinine, Urine: 50 mg/dL (ref 20–275)
Microalb Creat Ratio: 16 mcg/mg creat (ref <30)
Microalb, Ur: 0.8 mg/dL

## 2019-12-18 MED ORDER — GLIPIZIDE ER 10 MG PO TB24: 10.0000 mg | ORAL_TABLET | Freq: Every day | ORAL | 3 refills | Status: DC

## 2019-12-18 NOTE — Telephone Encounter (Signed)
Call placed to patient and patient made aware.   Prescription sent to pharmacy.   Appointment scheduled.  

## 2019-12-18 NOTE — Telephone Encounter (Signed)
Received call from lab. Reports that glucose noted at 496 on 12/17/2019.  Call placed to patient. States that she had cereal prior to labs yesterday.   Patient obtained CBG while on phone with Probation officer. States that reading is 323. Reports that she (1) slice of toast for breakfast.   Patient has no DM medications as she was diet controlled.   MD please advise.

## 2019-12-18 NOTE — Telephone Encounter (Signed)
NTBS ok for tomorrow. Meanwhile start glipizide xl 10 qam

## 2019-12-19 ENCOUNTER — Ambulatory Visit (INDEPENDENT_AMBULATORY_CARE_PROVIDER_SITE_OTHER): Payer: Medicare Other | Admitting: Family Medicine

## 2019-12-19 ENCOUNTER — Encounter: Payer: Self-pay | Admitting: Family Medicine

## 2019-12-19 ENCOUNTER — Other Ambulatory Visit: Payer: Self-pay

## 2019-12-19 VITALS — BP 122/74 | HR 82 | Temp 98.3°F | Resp 14 | Ht 60.0 in | Wt 154.0 lb

## 2019-12-19 DIAGNOSIS — E1165 Type 2 diabetes mellitus with hyperglycemia: Secondary | ICD-10-CM | POA: Diagnosis not present

## 2019-12-19 LAB — GLUCOSE 16585: Glucose: 147 mg/dL — ABNORMAL HIGH (ref 65–99)

## 2019-12-19 NOTE — Progress Notes (Signed)
   Subjective:    Patient ID: Rebekah Gutierrez. Rebekah Gutierrez, female    DOB: 05-22-1946, 74 y.o.   MRN: [id number]  Patient presents for Hyperglycemia (FSBS today 153)  Pt here to discuss labs , her glucose was 496 on labs, started on Glipizide   CBG this AM 154   A1C  8.6% on Wed     For breakfast she has plan cheeriors/ rice kispy or corn flake with splenda, or eggs, cinnon bread    Lunch typically has a protein and veggies,  Dinner eats light -sometimes fruit /yogurt or protein or sandwhich   Shis planning to get diet sprite and stop her  moutnain dews She does drink tea      Review Of Systems:  GEN- denies fatigue, fever, weight loss,weakness, recent illness HEENT- denies eye drainage, change in vision, nasal discharge, CVS- denies chest pain, palpitations RESP- denies SOB, cough, wheeze ABD- denies N/V, change in stools, abd pain GU- denies dysuria, hematuria, dribbling, incontinence MSK- denies joint pain, muscle aches, injury Neuro- denies headache, dizziness, syncope, seizure activity       Objective:    BP 122/74   Pulse 82   Temp 98.3 F (36.8 C) (Temporal)   Resp 14   Ht 5' (1.524 m)   Wt 154 lb (69.9 kg)   SpO2 99%   BMI 30.08 kg/m  GEN- NAD, alert and oriented x3         Assessment & Plan:      Problem List Items Addressed This Visit      Unprioritized   Type 2 diabetes mellitus with hyperglycemia (HCC) - Primary    Random glucose in office 147 Continue glipizide 10mg  with breakfast  She will monitor CBG daily goal 80-130 fasting Cut down on sweetened beverages, increase water Decrease carbs - heavy at breakfast and snacks Given handout on diabetic eating  Reviewed labs in detail at bedside       Relevant Orders   Glucose, Random      Note: This dictation was prepared with Dragon dictation along with smaller phrase technology. Any transcriptional errors that result from this process are unintentional.

## 2019-12-19 NOTE — Patient Instructions (Addendum)
Take the glipizide with breakfast  Check blood sugar every day, goal 80-130  F/U as previous

## 2019-12-19 NOTE — Assessment & Plan Note (Signed)
Random glucose in office 147 Continue glipizide 10mg  with breakfast  She will monitor CBG daily goal 80-130 fasting Cut down on sweetened beverages, increase water Decrease carbs - heavy at breakfast and snacks Given handout on diabetic eating  Reviewed labs in detail at bedside

## 2020-01-05 ENCOUNTER — Other Ambulatory Visit: Payer: Self-pay

## 2020-01-05 ENCOUNTER — Ambulatory Visit (HOSPITAL_COMMUNITY)
Admission: RE | Admit: 2020-01-05 | Discharge: 2020-01-05 | Disposition: A | Discharge status: Home / Self Care | Payer: Medicare Other | Source: Ambulatory Visit | Attending: Family Medicine | Admitting: Family Medicine

## 2020-01-05 ENCOUNTER — Encounter: Payer: Self-pay | Admitting: Family Medicine

## 2020-01-05 DIAGNOSIS — Z720 Tobacco use: Secondary | ICD-10-CM

## 2020-01-05 DIAGNOSIS — I700 Atherosclerosis of aorta: Secondary | ICD-10-CM | POA: Insufficient documentation

## 2020-01-05 DIAGNOSIS — R918 Other nonspecific abnormal finding of lung field: Secondary | ICD-10-CM

## 2020-01-09 ENCOUNTER — Encounter: Payer: Self-pay | Admitting: *Deleted

## 2020-01-09 MED ORDER — ATORVASTATIN CALCIUM 20 MG PO TABS: 20.0000 mg | ORAL_TABLET | Freq: Every day | ORAL | 3 refills | Status: DC

## 2020-01-26 ENCOUNTER — Other Ambulatory Visit: Payer: Self-pay | Admitting: Family Medicine

## 2020-01-28 ENCOUNTER — Other Ambulatory Visit: Payer: Self-pay | Admitting: *Deleted

## 2020-01-28 MED ORDER — AMLODIPINE BESYLATE 10 MG PO TABS: 10.0000 mg | ORAL_TABLET | Freq: Every day | ORAL | 0 refills | Status: DC

## 2020-01-28 MED ORDER — LISINOPRIL 40 MG PO TABS: 40.0000 mg | ORAL_TABLET | Freq: Every day | ORAL | 2 refills | Status: DC

## 2020-02-02 ENCOUNTER — Telehealth: Payer: Self-pay | Admitting: Family Medicine

## 2020-02-02 NOTE — Progress Notes (Signed)
  Chronic Care Management   Outreach Note  02/02/2020 Name: Allexia Canda. Roberge MRN: [id number] DOB: May 30, 1946  Referred by: [name], MD Reason for referral : No chief complaint on file.   An unsuccessful telephone outreach was attempted today. The patient was referred to the pharmacist for assistance with care management and care coordination.   This note is not being shared with the patient for the following reason: To respect privacy (The patient or proxy has requested that the information not be shared).      Follow Up Plan:   Upstream Scheduler Ilda Foil

## 2020-02-05 DIAGNOSIS — H401112 Primary open-angle glaucoma, right eye, moderate stage: Secondary | ICD-10-CM | POA: Diagnosis not present

## 2020-02-05 DIAGNOSIS — Z961 Presence of intraocular lens: Secondary | ICD-10-CM | POA: Diagnosis not present

## 2020-02-05 DIAGNOSIS — H40052 Ocular hypertension, left eye: Secondary | ICD-10-CM | POA: Diagnosis not present

## 2020-02-05 DIAGNOSIS — E119 Type 2 diabetes mellitus without complications: Secondary | ICD-10-CM | POA: Diagnosis not present

## 2020-02-20 ENCOUNTER — Telehealth: Payer: Self-pay | Admitting: Family Medicine

## 2020-02-20 NOTE — Progress Notes (Signed)
  Chronic Care Management   Note  02/20/2020 Name: Ritaj Dullea. Lapierre MRN: [id number] DOB: [birth year]  [name]. Rask is a 74 y.o. year old female who is a primary care patient of [place], Modena Nunnery, MD. I reached out to [place]. Yeary by phone today in response to a referral sent by Ms. [name] [place]'s PCP, [place], Modena Nunnery, MD.   Ms. Mishkin was given information about Chronic Care Management services today including:  1. CCM service includes personalized support from designated clinical staff supervised by her physician, including individualized plan of care and coordination with other care providers 2. 24/7 contact phone numbers for assistance for urgent and routine care needs. 3. Service will only be billed when office clinical staff spend 20 minutes or more in a month to coordinate care. 4. Only one practitioner may furnish and bill the service in a calendar month. 5. The patient may stop CCM services at any time (effective at the end of the month) by phone call to the office staff.   Patient agreed to services and verbal consent obtained.   Follow up plan:  [place]

## 2020-03-18 ENCOUNTER — Other Ambulatory Visit: Payer: Self-pay | Admitting: Family Medicine

## 2020-03-19 ENCOUNTER — Other Ambulatory Visit: Payer: Self-pay | Admitting: *Deleted

## 2020-03-19 MED ORDER — GLIPIZIDE ER 10 MG PO TB24: ORAL_TABLET | ORAL | 1 refills | Status: DC

## 2020-04-12 DIAGNOSIS — H40052 Ocular hypertension, left eye: Secondary | ICD-10-CM | POA: Diagnosis not present

## 2020-04-12 DIAGNOSIS — E119 Type 2 diabetes mellitus without complications: Secondary | ICD-10-CM | POA: Diagnosis not present

## 2020-04-12 DIAGNOSIS — H401112 Primary open-angle glaucoma, right eye, moderate stage: Secondary | ICD-10-CM | POA: Diagnosis not present

## 2020-04-12 DIAGNOSIS — Z961 Presence of intraocular lens: Secondary | ICD-10-CM | POA: Diagnosis not present

## 2020-04-12 LAB — HM DIABETES EYE EXAM

## 2020-04-14 NOTE — Chronic Care Management (AMB) (Signed)
Chronic Care Management Pharmacy  Name: Rebekah Gutierrez. Peruski  MRN: [id number] DOB: [birth year]  Chief Complaint/ HPI  Rebekah Gutierrez. Rebekah Gutierrez,  74 y.o. , female presents for their Initial CCM visit with the clinical pharmacist via telephone.  PCP : [name], MD  Their chronic conditions include: hypertension, Type II DM, hypertriglyceridemia.   Office Visits:  12/19/2019 [facility]) -   Here for hyperglycemia  Diet consisted of cheerios, rice krispy, effs, cinnamon bread, protein and veggie for lunch, and light dinner (sandwich, yogurt).  Drinks mountain dews and tea  Continued glipizide 37m at breakfast  12/17/2019 ([facility] - follow up   gabapentin increased to 3084mtwice daily for uncontrolled back pain  Consult Visit:none recent  Medications: Outpatient Encounter Medications as of 04/15/2020  Medication Sig  . amLODipine (NORVASC) 10 MG tablet Take 1 tablet (10 mg total) by mouth daily.  . Marland Kitchenpoaequorin (PREVAGEN) 10 MG CAPS Take by mouth.  . Marland Kitchenspirin 325 MG EC tablet Take 325 mg by mouth daily.  . Marland Kitchentorvastatin (LIPITOR) 20 MG tablet Take 1 tablet (20 mg total) by mouth daily.  . brimonidine (ALPHAGAN) 0.2 % ophthalmic solution Place 1 drop into the right eye in the morning and at bedtime.  . calcium carbonate (OS-CAL) 600 MG TABS Take 600 mg by mouth 2 (two) times daily with a meal.  . Cinnamon 500 MG TABS Take 1 tablet by mouth at bedtime.   . Marland Kitchensomeprazole (NEXIUM) 20 MG capsule Take 1 capsule (20 mg total) by mouth daily at 12 noon.  . gabapentin (NEURONTIN) 300 MG capsule Take 1 capsules  BID   for back pain  . glipiZIDE (GLUCOTROL XL) 10 MG 24 hr tablet Take 1 tablet by mouth once daily with breakfast  . Lancets (ONETOUCH DELICA PLUS [id number] USE 1  TO CHECK GLUCOSE TWICE DAILY AS DIRECTED  . lisinopril (ZESTRIL) 40 MG tablet Take 1 tablet (40 mg total) by mouth daily.  . Multiple Vitamin (MULTIVITAMIN WITH MINERALS) TABS tablet Take 1 tablet by mouth daily.  . Omega-3  Fatty Acids (FISH OIL) 1000 MG CAPS tAKE 1000MG TWICE A DAY FOR CHOLESTEROL  . ONETOUCH ULTRA test strip USE  STRIP TO CHECK GLUCOSE TWICE DAILY AS DIRECTED  . potassium chloride SA (K-DUR,KLOR-CON) 20 MEQ tablet Take 1 tablet (20 mEq total) by mouth 2 (two) times daily.   No facility-administered encounter medications on file as of 04/15/2020.     Current Diagnosis/Assessment:   FiEmergency planning/management officertrain: Low Risk   . Difficulty of Paying Living Expenses: Not very hard    Goals Addressed            This Visit's Progress   . Pharmacy Care Plan:       CARE PLAN ENTRY (see longitudinal plan of care for additional care plan information)  Current Barriers:  . Chronic Disease Management support, education, and care coordination needs related to Hypertension, Diabetes, and hypertriglyceridemia.   Hypertension BP Readings from Last 3 Encounters:  12/19/19 122/74  12/17/19 138/62  08/18/19 (!) 152/88   . Pharmacist Clinical Goal(s): o Over the next 90 days, patient will work with PharmD and providers to maintain BP goal <130/80 . Current regimen:  o Amlodipine 1085mo Lisinopril 84m28mInterventions: o Counseled on importance of medication adherence. o Comprehensive medication review. . Patient self care activities - Over the next 90 days, patient will: o Check BP when symptomatic, document, and provide at future appointments o Ensure daily salt intake <  2300 mg/day  Hypertriglyceridemia Lab Results  Component Value Date/Time   LDLCALC 92 06/09/2019 03:38 PM  Triglycerides - 174 . Pharmacist Clinical Goal(s): o Over the next 90 days, patient will work with PharmD and providers to achieve TG goal < 150 . Current regimen:  o Atorvastatin 35m daily . Interventions: o Reviewed most recent lipid panel o Discussed diet to low in fats and sugars . Patient self care activities - Over the next 90 days, patient will: o Continue to work on diet that will help lower  triglycerides  Diabetes Lab Results  Component Value Date/Time   HGBA1C 8.6 (H) 12/17/2019 12:43 PM   HGBA1C 6.9 (H) 06/09/2019 03:38 PM   HGBA1C 6.1 (H) 05/27/2014 04:37 PM   . Pharmacist Clinical Goal(s): o Over the next 90 days, patient will work with PharmD and providers to achieve A1c goal <7% . Current regimen:  o Glipizide XL 112mdaily with breakfast . Interventions: o Reviewed home fasting blood sugar logs o Discussed diet conducive to euglycemia . Patient self care activities - Over the next 90 days, patient will: o Check blood sugar once daily, document, and provide at future appointments o Contact provider with any episodes of hypoglycemia o Report to PCP for updated A1c  Initial goal documentation        Diabetes   Recent Relevant Labs: Lab Results  Component Value Date/Time   HGBA1C 8.6 (H) 12/17/2019 12:43 PM   HGBA1C 6.9 (H) 06/09/2019 03:38 PM   HGBA1C 6.1 (H) 05/27/2014 04:37 PM   MICROALBUR 0.8 12/17/2019 12:43 PM     Checking BG: Daily - fasting only  Recent FBG Readings:  127, 121, 122, 120, 128, 130, 105  Patient is currently controlled on the following medications:  Glipizide XL 1095maily  Last diabetic Foot exam:  Lab Results  Component Value Date/Time   HMDIABEYEEXA No Retinopathy 04/08/2018 12:00 AM    Last diabetic Eye exam: No results found for: HMDIABFOOTEX   We discussed: patient has been working on her diet tremendously since last A1c in April.  She stopped eating carrots marinated in honey, she will drain syrup out of fruit cups.  She is eating less carbohydrates overall.  Lost of time spent today going over diabetic diet.  She reports fasting blood sugars WNL and no reports of hypoglycemia with lowest blood sugar being 105 fasting.  Plan  Continue current medications, recheck A1c at visit next week.  Hypertension    Office blood pressures are  BP Readings from Last 3 Encounters:  12/19/19 122/74  12/17/19 138/62   08/18/19 (!) 152/88    Patient has failed these meds in the past: none noted  Patient checks BP at home never  Patient home BP readings are ranging: N/A  Patient is currently controlled on the following medications:   Amlodipine 50m18misinopril 40mg38mports adherence with medication.  No dizziness, headaches noted.  Reports minimal swelling that resolves when legs elevated.  Plan  Continue current medications     Hypertriglyceridemia   LDL goal < 100  Lipid Panel     Component Value Date/Time   CHOL 160 06/09/2019 1538   TRIG 174 (H) 06/09/2019 1538   HDL 41 (L) 06/09/2019 1538   LDLCALC 92 06/09/2019 1538    Hepatic Function Latest Ref Rng & Units 12/17/2019 06/09/2019 02/04/2019  Total Protein 6.1 - 8.1 g/dL 7.0 7.2 7.3  Albumin 3.6 - 5.1 g/dL - - -  AST 10 - 35 U/L  _0 ALT 6 - 29 U/L 23 26 34(H)  Alk Phosphatase 33 - 130 U/L - - -  Total Bilirubin 0.2 - 1.2 mg/dL 0.5 0.5 0.6  Bilirubin, Direct 0.0 - 0.3 mg/dL - - -     The 10-year ASCVD risk score Mikey Bussing DC Jr., et al., 2013) is: 37.8%   Values used to calculate the score:     Age: 62 years     Sex: Female     Is Non-Hispanic African American: Yes     Diabetic: Yes     Tobacco smoker: Yes     Systolic Blood Pressure: 047 mmHg     Is BP treated: Yes     HDL Cholesterol: 41 mg/dL     Total Cholesterol: 160 mg/dL   Patient has failed these meds in past: none noted Patient is currently uncontrolled on the following medications:  . Atorvastatin 47m  We discussed:  Most recent lipid panel showed elevated TG's.  No recent lipid panel available.  Patient has worked on diet a great deal since then and reports compliance with medication.  Suspect improvement on next lipid panel.  Plan  Continue current medications, recommend repeat lipid panel.  Vaccines   Reviewed and discussed patient's vaccination history.    Immunization History  Administered Date(s) Administered  . Moderna SARS-COVID-2  Vaccination 11/05/2019, 12/03/2019  . PPD Test 11/01/2015, 02/27/2017, 06/26/2017  . Pneumococcal Polysaccharide-23 05/13/2013  . Tdap 12/25/2012  . Zoster 12/25/2012    Plan  Recommended patient receive Shingles vaccine in office.  Medication Management   . Miscellaneous medications:  o Brimonidine 0.2% twice daily in the right eye . OTC's:  o Cinnamon o Prevagen o ASA 3253mo Os-Cal 60068mid . [facility] #  [phone] reports using no specific method to organize medications and promote adherence. . Patient denies missed doses of medication.   ChrBeverly MilchharmD Clinical Pharmacist [phone]

## 2020-04-15 ENCOUNTER — Ambulatory Visit: Payer: Medicare Other | Admitting: Pharmacist

## 2020-04-15 ENCOUNTER — Other Ambulatory Visit: Payer: Self-pay

## 2020-04-15 DIAGNOSIS — E1165 Type 2 diabetes mellitus with hyperglycemia: Secondary | ICD-10-CM

## 2020-04-15 DIAGNOSIS — E781 Pure hyperglyceridemia: Secondary | ICD-10-CM

## 2020-04-15 DIAGNOSIS — I10 Essential (primary) hypertension: Secondary | ICD-10-CM

## 2020-04-15 NOTE — Patient Instructions (Addendum)
Visit Information Thank you for meeting with me today!  I look forward to working with you to help you meet all of your healthcare goals and answer any questions you may have.  Feel free to contact me anytime!  Goals Addressed            This Visit's Progress   . Pharmacy Care Plan:         Rebekah Gutierrez was given information about Chronic Care Management services today including:  1. CCM service includes personalized support from designated clinical staff supervised by her physician, including individualized plan of care and coordination with other care providers 2. 24/7 contact phone numbers for assistance for urgent and routine care needs. 3. Standard insurance, coinsurance, copays and deductibles apply for chronic care management only during months in which we provide at least 20 minutes of these services. Most insurances cover these services at 100%, however patients may be responsible for any copay, coinsurance and/or deductible if applicable. This service may help you avoid the need for more expensive face-to-face services. 4. Only one practitioner may furnish and bill the service in a calendar month. 5. The patient may stop CCM services at any time (effective at the end of the month) by phone call to the office staff.  Patient agreed to services and verbal consent obtained.   The patient verbalized understanding of instructions provided today and agreed to receive a mailed copy of patient instruction and/or educational materials. Telephone follow up appointment with pharmacy team member scheduled for: 3 months  Rebekah Gutierrez, PharmD Clinical Pharmacist [facility] [phone]   High Triglycerides Eating Plan Triglycerides are a type of fat in the blood. High levels of triglycerides can increase your risk of heart disease and stroke. If your triglyceride levels are high, choosing the right foods can help lower your triglycerides and keep your heart healthy.  Work with your health care provider or a diet and nutrition specialist (dietitian) to develop an eating plan that is right for you. What are tips for following this plan? General guidelines   Lose weight, if you are overweight. For most people, losing 5-10 lbs (2-5 kg) helps lower triglyceride levels. A weight-loss plan may include. ? 30 minutes of exercise at least 5 days a week. ? Reducing the amount of calories, sugar, and fat you eat.  Eat a wide variety of fresh fruits, vegetables, and whole grains. These foods are high in fiber.  Eat foods that contain healthy fats, such as fatty fish, nuts, seeds, and olive oil.  Avoid foods that are high in added sugar, added salt (sodium), saturated fat, and trans fat.  Avoid low-fiber, refined carbohydrates such as white bread, crackers, noodles, and white rice.  Avoid foods with partially hydrogenated oils (trans fats), such as fried foods or stick margarine.  Limit alcohol intake to no more than 1 drink a day for nonpregnant women and 2 drinks a day for men. One drink equals 12 oz of beer, 5 oz of wine, or 1 oz of hard liquor. Your health care provider may recommend that you drink less depending on your overall health. Reading food labels  Check food labels for the amount of saturated fat. Choose foods with no or very little saturated fat.  Check food labels for the amount of trans fat. Choose foods with no trans fat.  Check food labels for the amount of cholesterol. Choose foods low in cholesterol. Ask your dietitian how much cholesterol you should have each  day.  Check food labels for the amount of sodium. Choose foods with less than 140 milligrams (mg) per serving. Shopping  Buy dairy products labeled as nonfat (skim) or low-fat (1%).  Avoid buying processed or prepackaged foods. These are often high in added sugar, sodium, and fat. Cooking  Choose healthy fats when cooking, such as olive oil or canola oil.  Cook foods using  lower fat methods, such as baking, broiling, boiling, or grilling.  Make your own sauces, dressings, and marinades when possible, instead of buying them. Store-bought sauces, dressings, and marinades are often high in sodium and sugar. Meal planning  Eat more home-cooked food and less restaurant, buffet, and fast food.  Eat fatty fish at least 2 times each week. Examples of fatty fish include salmon, trout, mackerel, tuna, and herring.  If you eat whole eggs, do not eat more than 3 egg yolks per week. What foods are recommended? The items listed may not be a complete list. Talk with your dietitian about what dietary choices are best for you. Grains Whole wheat or whole grain breads, crackers, cereals, and pasta. Unsweetened oatmeal. Bulgur. Barley. Quinoa. Brown rice. Whole wheat flour tortillas. Vegetables Fresh or frozen vegetables. Low-sodium canned vegetables. Fruits All fresh, canned (in natural juice), or frozen fruits. Meats and other protein foods Skinless chicken or [place]. Ground chicken or [place]. Lean cuts of pork, trimmed of fat. Fish and seafood, especially salmon, trout, and herring. Egg whites. Dried beans, peas, or lentils. Unsalted nuts or seeds. Unsalted canned beans. Natural peanut or almond butter. Dairy Low-fat dairy products. Skim or low-fat (1%) milk. Reduced fat (2%) and low-sodium cheese. Low-fat ricotta cheese. Low-fat cottage cheese. Plain, low-fat yogurt. Fats and oils Tub margarine without trans fats. Light or reduced-fat mayonnaise. Light or reduced-fat salad dressings. Avocado. Safflower, olive, sunflower, soybean, and canola oils. What foods are not recommended? The items listed may not be a complete list. Talk with your dietitian about what dietary choices are best for you. Grains White bread. White (regular) pasta. White rice. Cornbread. Bagels. Pastries. Crackers that contain trans fat. Vegetables Creamed or fried vegetables. Vegetables in a cheese  sauce. Fruits Sweetened dried fruit. Canned fruit in syrup. Fruit juice. Meats and other protein foods Fatty cuts of meat. Ribs. Chicken wings. [name]. Sausage. Bologna. Salami. Chitterlings. Fatback. Hot dogs. Bratwurst. Packaged lunch meats. Dairy Whole or reduced-fat (2%) milk. Half-and-half. Cream cheese. Full-fat or sweetened yogurt. Full-fat cheese. Nondairy creamers. Whipped toppings. Processed cheese or cheese spreads. Cheese curds. Beverages Alcohol. Sweetened drinks, such as soda, lemonade, fruit drinks, or punches. Fats and oils Butter. Stick margarine. Lard. Shortening. Ghee. Bacon fat. Tropical oils, such as coconut, palm kernel, or palm oils. Sweets and desserts Corn syrup. Sugars. Honey. Molasses. Candy. Jam and jelly. Syrup. Sweetened cereals. Cookies. Pies. Cakes. Donuts. Muffins. Ice cream. Condiments Store-bought sauces, dressings, and marinades that are high in sugar, such as ketchup and barbecue sauce. Summary  High levels of triglycerides can increase the risk of heart disease and stroke. Choosing the right foods can help lower your triglycerides.  Eat plenty of fresh fruits, vegetables, and whole grains. Choose low-fat dairy and lean meats. Eat fatty fish at least twice a week.  Avoid processed and prepackaged foods with added sugar, sodium, saturated fat, and trans fat.  If you need suggestions or have questions about what types of food are good for you, talk with your health care provider or a dietitian. This information is not intended to replace advice given to  you by your health care provider. Make sure you discuss any questions you have with your health care provider. Document Revised: 08/10/2017 Document Reviewed: 10/31/2016 Elsevier Patient Education  2020 [employer].

## 2020-04-16 ENCOUNTER — Other Ambulatory Visit: Payer: Self-pay | Admitting: Family Medicine

## 2020-04-16 ENCOUNTER — Other Ambulatory Visit: Payer: Self-pay | Admitting: *Deleted

## 2020-04-16 DIAGNOSIS — R918 Other nonspecific abnormal finding of lung field: Secondary | ICD-10-CM

## 2020-04-16 DIAGNOSIS — M5136 Other intervertebral disc degeneration, lumbar region: Secondary | ICD-10-CM

## 2020-04-16 DIAGNOSIS — M48061 Spinal stenosis, lumbar region without neurogenic claudication: Secondary | ICD-10-CM

## 2020-04-16 DIAGNOSIS — K5900 Constipation, unspecified: Secondary | ICD-10-CM

## 2020-04-16 DIAGNOSIS — Z720 Tobacco use: Secondary | ICD-10-CM

## 2020-04-16 DIAGNOSIS — E1165 Type 2 diabetes mellitus with hyperglycemia: Secondary | ICD-10-CM

## 2020-04-16 DIAGNOSIS — I10 Essential (primary) hypertension: Secondary | ICD-10-CM

## 2020-04-16 DIAGNOSIS — E781 Pure hyperglyceridemia: Secondary | ICD-10-CM

## 2020-04-19 ENCOUNTER — Encounter: Payer: Self-pay | Admitting: Family Medicine

## 2020-04-19 ENCOUNTER — Ambulatory Visit (INDEPENDENT_AMBULATORY_CARE_PROVIDER_SITE_OTHER): Payer: Medicare Other | Admitting: Family Medicine

## 2020-04-19 ENCOUNTER — Other Ambulatory Visit: Payer: Self-pay

## 2020-04-19 VITALS — BP 136/82 | HR 84 | Temp 99.0°F | Resp 16 | Ht 60.0 in | Wt 149.0 lb

## 2020-04-19 DIAGNOSIS — E1165 Type 2 diabetes mellitus with hyperglycemia: Secondary | ICD-10-CM

## 2020-04-19 DIAGNOSIS — K439 Ventral hernia without obstruction or gangrene: Secondary | ICD-10-CM | POA: Diagnosis not present

## 2020-04-19 DIAGNOSIS — I700 Atherosclerosis of aorta: Secondary | ICD-10-CM | POA: Diagnosis not present

## 2020-04-19 DIAGNOSIS — M48061 Spinal stenosis, lumbar region without neurogenic claudication: Secondary | ICD-10-CM

## 2020-04-19 DIAGNOSIS — I10 Essential (primary) hypertension: Secondary | ICD-10-CM | POA: Diagnosis not present

## 2020-04-19 MED ORDER — TRAMADOL HCL 50 MG PO TABS: 50.0000 mg | ORAL_TABLET | Freq: Two times a day (BID) | ORAL | 1 refills | Status: AC | PRN

## 2020-04-19 NOTE — Assessment & Plan Note (Signed)
Improved CBG Obtain eye visit Continue glipizide

## 2020-04-19 NOTE — Progress Notes (Signed)
° °  Subjective:    Patient ID: Rebekah Gutierrez. [name], female    DOB: 1946/05/13, 74 y.o.   MRN: [id number]  Patient presents for Follow-up (is fasting)  Pt here to f/u chronic medical   DM- last A1C 8.6% in April, CBG per her records range 120-150's   taking GLipizide 10mg  once a day  She has cut down on sugary beverages, weight down 5lbs  She does eat three times day, but lunch is smaller, she has cut down on sweets, she eats fruit at night  No physical activity   HTN- taking blood pressure medicine as prescribed  Hyperipidemia/ arotic atthercolersos - taking lipitor   Lung nodules were stable   She would like a consult for her ventral hernia   Chronic back pain with sciatrica , at last visit her gabaoentin was increased to 300mg  TID   Eye doctor- [facility] ,has glaucoma and needs surgery on right eye     Review Of Systems:  GEN- denies fatigue, fever, weight loss,weakness, recent illness HEENT- denies eye drainage, change in vision, nasal discharge, CVS- denies chest pain, palpitations RESP- denies SOB, cough, wheeze ABD- denies N/V, change in stools, abd pain GU- denies dysuria, hematuria, dribbling, incontinence MSK- denies joint pain, muscle aches, injury Neuro- denies headache, dizziness, syncope, seizure activity       Objective:    BP 136/82    Pulse 84    Temp 99 F (37.2 C) (Temporal)    Resp 16    Ht 5' (1.524 m)    Wt 149 lb (67.6 kg)    SpO2 96%    BMI 29.10 kg/m  GEN- NAD, alert and oriented x3 HEENT- PERRL, EOMI, non injected sclera, pink conjunctiva, MMM, oropharynx clear Neck- Supple, no thyromegaly CVS- RRR, no murmur RESP-CTAB ABD-NABS,soft, upper midline ventral mass/hernia NT,ND EXT- No edema Pulses- Radial, DP- 2+        Assessment & Plan:      Problem List Items Addressed This Visit      Unprioritized   Aortic atherosclerosis (HCC)    Continue lipitor , ASA Check lipids, GOAL LDL around 70      Relevant Orders   Lipid panel    CBC with Differential/Platelet   Essential hypertension, benign    Blood pressure controlled no changes       Relevant Orders   TSH   Spinal stenosis of lumbar region    History of back surgery Referral for injections Continue gabapentin Given ultram can use before bedtime       Relevant Orders   Ambulatory referral to Physical Medicine Rehab   Type 2 diabetes mellitus with hyperglycemia (HCC) - Primary    Improved CBG Obtain eye visit Continue glipizide       Relevant Orders   Comprehensive metabolic panel   HM DIABETES FOOT EXAM (Completed)   Hemoglobin A1c   Ventral hernia   Relevant Orders   Ambulatory referral to General Surgery      Note: This dictation was prepared with Dragon dictation along with smaller phrase technology. Any transcriptional errors that result from this process are unintentional.

## 2020-04-19 NOTE — Assessment & Plan Note (Signed)
Continue lipitor , ASA Check lipids, GOAL LDL around 70

## 2020-04-19 NOTE — Assessment & Plan Note (Signed)
History of back surgery Referral for injections Continue gabapentin Given ultram can use before bedtime

## 2020-04-19 NOTE — Patient Instructions (Addendum)
Referral to Dr. Arnoldo Morale for the hernia Referral to Dr. Brien Few for injections We will call with lab results  F/U 4 months for Physical

## 2020-04-19 NOTE — Assessment & Plan Note (Signed)
Blood pressure controlled no changes 

## 2020-04-20 LAB — CBC WITH DIFFERENTIAL/PLATELET
Absolute Monocytes: 432 cells/uL (ref 200–950)
Basophils Absolute: 42 cells/uL (ref 0–200)
Basophils Relative: 0.7 %
Eosinophils Absolute: 48 cells/uL (ref 15–500)
Eosinophils Relative: 0.8 %
HCT: 39.4 % (ref 35.0–45.0)
Hemoglobin: 12.9 g/dL (ref 11.7–15.5)
Lymphs Abs: 2868 cells/uL (ref 850–3900)
MCH: 27.9 pg (ref 27.0–33.0)
MCHC: 32.7 g/dL (ref 32.0–36.0)
MCV: 85.3 fL (ref 80.0–100.0)
MPV: 10.6 fL (ref 7.5–12.5)
Monocytes Relative: 7.2 %
Neutro Abs: 2610 cells/uL (ref 1500–7800)
Neutrophils Relative %: 43.5 %
Platelets: 246 10*3/uL (ref 140–400)
RBC: 4.62 10*6/uL (ref 3.80–5.10)
RDW: 12.8 % (ref 11.0–15.0)
Total Lymphocyte: 47.8 %
WBC: 6 10*3/uL (ref 3.8–10.8)

## 2020-04-20 LAB — TSH: TSH: 0.94 mIU/L (ref 0.40–4.50)

## 2020-04-20 LAB — COMPREHENSIVE METABOLIC PANEL
AG Ratio: 1.5 (calc) (ref 1.0–2.5)
ALT: 24 U/L (ref 6–29)
AST: 23 U/L (ref 10–35)
Albumin: 4.3 g/dL (ref 3.6–5.1)
Alkaline phosphatase (APISO): 79 U/L (ref 37–153)
BUN: 16 mg/dL (ref 7–25)
CO2: 28 mmol/L (ref 20–32)
Calcium: 9.8 mg/dL (ref 8.6–10.4)
Chloride: 104 mmol/L (ref 98–110)
Creat: 0.8 mg/dL (ref 0.60–0.93)
Globulin: 2.8 g/dL (calc) (ref 1.9–3.7)
Glucose, Bld: 139 mg/dL — ABNORMAL HIGH (ref 65–99)
Potassium: 4.5 mmol/L (ref 3.5–5.3)
Sodium: 141 mmol/L (ref 135–146)
Total Bilirubin: 0.7 mg/dL (ref 0.2–1.2)
Total Protein: 7.1 g/dL (ref 6.1–8.1)

## 2020-04-20 LAB — HEMOGLOBIN A1C
Hgb A1c MFr Bld: 6.5 % of total Hgb — ABNORMAL HIGH (ref <5.7)
Mean Plasma Glucose: 140 (calc)
eAG (mmol/L): 7.7 (calc)

## 2020-04-20 LAB — LIPID PANEL
Cholesterol: 77 mg/dL (ref <200)
HDL: 33 mg/dL — ABNORMAL LOW (ref >=50)
LDL Cholesterol (Calc): 21 mg/dL (calc)
Non-HDL Cholesterol (Calc): 44 mg/dL (calc) (ref <130)
Total CHOL/HDL Ratio: 2.3 (calc) (ref <5.0)
Triglycerides: 144 mg/dL (ref <150)

## 2020-04-22 ENCOUNTER — Other Ambulatory Visit: Payer: Self-pay | Admitting: *Deleted

## 2020-04-22 MED ORDER — ATORVASTATIN CALCIUM 10 MG PO TABS: 10.0000 mg | ORAL_TABLET | Freq: Every day | ORAL | 1 refills | Status: DC

## 2020-04-27 ENCOUNTER — Other Ambulatory Visit: Payer: Self-pay | Admitting: Family Medicine

## 2020-05-03 LAB — HM DIABETES EYE EXAM

## 2020-05-04 ENCOUNTER — Telehealth: Payer: Self-pay | Admitting: *Deleted

## 2020-05-04 DIAGNOSIS — M48061 Spinal stenosis, lumbar region without neurogenic claudication: Secondary | ICD-10-CM

## 2020-05-04 NOTE — Telephone Encounter (Signed)
Received call from patient.   Reports that she has been referred to [facility] for epidural injections for back pain.   States that appointment has been cancelled and re-scheduled by neurosurgery office multiple times. States that she was told her appointment was today and proceeded to find transportation as she was informed she could not drive after injection. Reports that once she arrived at the office, no appointment had been made.   Patient contact PCP and requested to change office at this time.   New referral placed for [facility], Laverda Page, MD~ Physiatry, Physical Medicine & Rehabilitation Physical Medicine and Rehabilitation, Physical Medicine and Rehabilitation.  Call placed to patient and patient made aware.

## 2020-05-06 ENCOUNTER — Ambulatory Visit (INDEPENDENT_AMBULATORY_CARE_PROVIDER_SITE_OTHER): Payer: Medicare Other | Admitting: General Surgery

## 2020-05-06 ENCOUNTER — Other Ambulatory Visit: Payer: Self-pay

## 2020-05-06 ENCOUNTER — Encounter: Payer: Self-pay | Admitting: General Surgery

## 2020-05-06 VITALS — BP 160/75 | HR 86 | Temp 98.0°F | Resp 14 | Ht 60.0 in | Wt 150.0 lb

## 2020-05-06 DIAGNOSIS — K432 Incisional hernia without obstruction or gangrene: Secondary | ICD-10-CM

## 2020-05-06 NOTE — H&P (Signed)
Rebekah Gutierrez [name] [id number]; [birth year]   HPI Patient is a 74yo bf who was referred to my care by Dr. Buelah Manis for evaluation and treatment of a ventral hernia.  Has been present for many years  Had a laparoscopic cholecystectomy in the remote past.  States is is bothering her more.  No nausea, vomiting.  No current abdominal pain.  Seems to be increasing in size over the past few years. Past Medical History:  Diagnosis Date  . Diabetes ([place])   . GERD (gastroesophageal reflux disease)   . Hiatal hernia   . Hypertension   . Vertigo     Past Surgical History:  Procedure Laterality Date  . BACK SURGERY    . BLADDER SURGERY    . CERVICAL SPINE SURGERY     fusion  . CHOLECYSTECTOMY, LAPAROSCOPIC     Remote Past  . COLONOSCOPY  06/20/2012   Procedure: COLONOSCOPY;  Surgeon: Rogene Houston, MD;  Location: [facility];  Service: Endoscopy;  Laterality: N/A;  250  . FOOT SURGERY     left   . HAND SURGERY     left  . TUBAL LIGATION      Family History  Problem Relation Age of Onset  . Hypertension Mother   . Stroke Mother   . Hypertension Father   . Cancer Father   . Colon cancer Neg Hx     Current Outpatient Medications on File Prior to Visit  Medication Sig Dispense Refill  . amLODipine (NORVASC) 10 MG tablet Take 1 tablet (10 mg total) by mouth daily. 90 tablet 0  . Apoaequorin (PREVAGEN) 10 MG CAPS Take by mouth.    Marland Kitchen aspirin 325 MG EC tablet Take 325 mg by mouth daily.    Marland Kitchen atorvastatin (LIPITOR) 10 MG tablet Take 1 tablet (10 mg total) by mouth daily. 90 tablet 1  . brimonidine (ALPHAGAN) 0.2 % ophthalmic solution Place 1 drop into the right eye in the morning and at bedtime.    . calcium carbonate (OS-CAL) 600 MG TABS Take 600 mg by mouth 2 (two) times daily with a meal.    . Cinnamon 500 MG TABS Take 1 tablet by mouth at bedtime.     Marland Kitchen esomeprazole (NEXIUM) 20 MG capsule Take 1 capsule (20 mg total) by mouth daily at 12 noon. 90 capsule 0  . gabapentin (NEURONTIN) 300 MG  capsule Take 1 capsules  BID   for back pain 90 capsule 2  . glipiZIDE (GLUCOTROL XL) 10 MG 24 hr tablet Take 1 tablet by mouth once daily with breakfast 90 tablet 1  . Lancets (ONETOUCH DELICA PLUS [id number]) MISC USE 1 LANCET TO CHECK GLUCOSE TWICE DAILY AS DIRECTED 100 each 3  . lisinopril (ZESTRIL) 40 MG tablet Take 1 tablet (40 mg total) by mouth daily. 90 tablet 2  . Multiple Vitamin (MULTIVITAMIN WITH MINERALS) TABS tablet Take 1 tablet by mouth daily.    . Omega-3 Fatty Acids (FISH OIL) 1000 MG CAPS tAKE 1000MG  TWICE A DAY FOR CHOLESTEROL  0  . ONETOUCH ULTRA test strip USE 1 STRIP TO CHECK GLUCOSE TWICE DAILY AS DIRECTED 100 each 3  . potassium chloride SA (K-DUR,KLOR-CON) 20 MEQ tablet Take 1 tablet (20 mEq total) by mouth 2 (two) times daily. 180 tablet 2  . VYZULTA 0.024 % SOLN      No current facility-administered medications on file prior to visit.    No Known Allergies  Social History   Substance and Sexual Activity  Alcohol Use No  . Alcohol/week: 0.0 standard drinks    Social History   Tobacco Use  Smoking Status Current Every Day Smoker  . Packs/day: 0.50  . Years: 45.00  . Pack years: 22.50  . Types: Cigarettes  Smokeless Tobacco Never Used  Tobacco Comment   1/2 pack a day since age 44    Review of Systems  Constitutional: Negative.   HENT: Negative.   Eyes: Positive for blurred vision.  Respiratory: Negative.   Cardiovascular: Negative.   Gastrointestinal: Negative.   Genitourinary: Positive for frequency.  Musculoskeletal: Negative.   Skin: Negative.   Neurological: Negative.   Endo/Heme/Allergies: Negative.   Psychiatric/Behavioral: Negative.     Objective   Vitals:   05/06/20 1058  BP: (!) 160/75  Pulse: 86  Resp: 14  Temp: 98 F (36.7 C)  SpO2: 96%    Physical Exam Vitals reviewed.  Constitutional:      Appearance: Normal appearance. She is not ill-appearing.  HENT:     Head: Normocephalic and atraumatic.  Cardiovascular:      Rate and Rhythm: Normal rate and regular rhythm.     Heart sounds: Normal heart sounds. No murmur heard.  No friction rub. No gallop.   Pulmonary:     Effort: Pulmonary effort is normal. No respiratory distress.     Breath sounds: Normal breath sounds. No stridor. No wheezing, rhonchi or rales.  Abdominal:     General: Bowel sounds are normal. There is no distension.     Palpations: Abdomen is soft. There is no mass.     Tenderness: There is no abdominal tenderness. There is no guarding or rebound.     Hernia: A hernia is present.     Comments: Reducible epigastric hernia noted just deep to a surgical scar.  Skin:    General: Skin is warm and dry.  Neurological:     Mental Status: She is alert and oriented to person, place, and time.    PCP notes reviewed Assessment  Incisional hernia Plan   Scheduled for incisional herniorrhaphy with mesh on 05/26/20.  The risks and benefits of the procedure including bleeding, infection, mesh use, and the possibility of recurrence of the hernia were fully explained to the patient, who gives informed consent.

## 2020-05-06 NOTE — Patient Instructions (Signed)
Ventral Hernia  A ventral hernia is a bulge of tissue from inside the abdomen that pushes through a weak area of the muscles that form the front wall of the abdomen. The tissues inside the abdomen are inside a sac (peritoneum). These tissues include the small intestine, large intestine, and the fatty tissue that covers the intestines (omentum). Sometimes, the bulge that forms a hernia contains intestines. Other hernias contain only fat. Ventral hernias do not go away without surgical treatment. There are several types of ventral hernias. You may have:  A hernia at an incision site from previous abdominal surgery (incisional hernia).  A hernia just above the belly button (epigastric hernia), or at the belly button (umbilical hernia). These types of hernias can develop from heavy lifting or straining.  A hernia that comes and goes (reducible hernia). It may be visible only when you lift or strain. This type of hernia can be pushed back into the abdomen (reduced).  A hernia that traps abdominal tissue inside the hernia (incarcerated hernia). This type of hernia does not reduce.  A hernia that cuts off blood flow to the tissues inside the hernia (strangulated hernia). The tissues can start to die if this happens. This is a very painful bulge that cannot be reduced. A strangulated hernia is a medical emergency. What are the causes? This condition is caused by abdominal tissue putting pressure on an area of weakness in the abdominal muscles. What increases the risk? The following factors may make you more likely to develop this condition:  Being female.  Being 60 or older.  Being overweight or obese.  Having had previous abdominal surgery, especially if there was an infection after surgery.  Having had an injury to the abdominal wall.  Having had several pregnancies.  Having a buildup of fluid inside the abdomen (ascites). What are the signs or symptoms? The only symptom of a ventral hernia  may be a painless bulge in the abdomen. A reducible hernia may be visible only when you strain, cough, or lift. Other symptoms may include:  Dull pain.  A feeling of pressure. Signs and symptoms of a strangulated hernia may include:  Increasing pain.  Nausea and vomiting.  Pain when pressing on the hernia.  The skin over the hernia turning red or purple.  Constipation.  Blood in the stool (feces). How is this diagnosed? This condition may be diagnosed based on:  Your symptoms.  Your medical history.  A physical exam. You may be asked to cough or strain while standing. These actions increase the pressure inside your abdomen and force the hernia through the opening in your muscles. Your health care provider may try to reduce the hernia by pressing on it.  Imaging studies, such as an ultrasound or CT scan. How is this treated? This condition is treated with surgery. If you have a strangulated hernia, surgery is done as soon as possible. If your hernia is small and not incarcerated, you may be asked to lose some weight before surgery. Follow these instructions at home:  Follow instructions from your health care provider about eating or drinking restrictions.  If you are overweight, your health care provider may recommend that you increase your activity level and eat a healthier diet.  Do not lift anything that is heavier than 10 lb (4.5 kg).  Return to your normal activities as told by your health care provider. Ask your health care provider what activities are safe for you. You may need to avoid activities   that increase pressure on your hernia.  Take over-the-counter and prescription medicines only as told by your health care provider.  Keep all follow-up visits as told by your health care provider. This is important. Contact a health care provider if:  Your hernia gets larger.  Your hernia becomes painful. Get help right away if:  Your hernia becomes increasingly  painful.  You have pain along with any of the following: ? Changes in skin color in the area of the hernia. ? Nausea. ? Vomiting. ? Fever. Summary  A ventral hernia is a bulge of tissue from inside the abdomen that pushes through a weak area of the muscles that form the front wall of the abdomen.  This condition is treated with surgery, which may be urgent depending on your hernia.  Do not lift anything that is heavier than 10 lb (4.5 kg), and follow activity instructions from your health care provider. This information is not intended to replace advice given to you by your health care provider. Make sure you discuss any questions you have with your health care provider. Document Revised: 10/10/2017 Document Reviewed: 03/19/2017 Elsevier Patient Education  2020 [employer]  

## 2020-05-06 NOTE — Progress Notes (Signed)
Rebekah Gutierrez [id number]; March 10, 1946   HPI Patient is a 74yo bf who was referred to my care by Dr. Buelah Manis for evaluation and treatment of a ventral hernia.  Has been present for many years  Had a laparoscopic cholecystectomy in the remote past.  States is is bothering her more.  No nausea, vomiting.  No current abdominal pain.  Seems to be increasing in size over the past few years. Past Medical History:  Diagnosis Date  . Diabetes ([place])   . GERD (gastroesophageal reflux disease)   . Hiatal hernia   . Hypertension   . Vertigo     Past Surgical History:  Procedure Laterality Date  . BACK SURGERY    . BLADDER SURGERY    . CERVICAL SPINE SURGERY     fusion  . CHOLECYSTECTOMY, LAPAROSCOPIC     Remote Past  . COLONOSCOPY  06/20/2012   Procedure: COLONOSCOPY;  Surgeon: Rogene Houston, MD;  Location: [facility];  Service: Endoscopy;  Laterality: N/A;  250  . FOOT SURGERY     left   . HAND SURGERY     left  . TUBAL LIGATION      Family History  Problem Relation Age of Onset  . Hypertension Mother   . Stroke Mother   . Hypertension Father   . Cancer Father   . Colon cancer Neg Hx     Current Outpatient Medications on File Prior to Visit  Medication Sig Dispense Refill  . amLODipine (NORVASC) 10 MG tablet Take 1 tablet (10 mg total) by mouth daily. 90 tablet 0  . Apoaequorin (PREVAGEN) 10 MG CAPS Take by mouth.    Marland Kitchen aspirin 325 MG EC tablet Take 325 mg by mouth daily.    Marland Kitchen atorvastatin (LIPITOR) 10 MG tablet Take 1 tablet (10 mg total) by mouth daily. 90 tablet 1  . brimonidine (ALPHAGAN) 0.2 % ophthalmic solution Place 1 drop into the right eye in the morning and at bedtime.    . calcium carbonate (OS-CAL) 600 MG TABS Take 600 mg by mouth 2 (two) times daily with a meal.    . Cinnamon 500 MG TABS Take 1 tablet by mouth at bedtime.     Marland Kitchen esomeprazole (NEXIUM) 20 MG capsule Take 1 capsule (20 mg total) by mouth daily at 12 noon. 90 capsule 0  . gabapentin (NEURONTIN) 300 MG  capsule Take 1 capsules  BID   for back pain 90 capsule 2  . glipiZIDE (GLUCOTROL XL) 10 MG 24 hr tablet Take 1 tablet by mouth once daily with breakfast 90 tablet 1  . Lancets (ONETOUCH DELICA PLUS [id number]) MISC USE 1 LANCET TO CHECK GLUCOSE TWICE DAILY AS DIRECTED 100 each 3  . lisinopril (ZESTRIL) 40 MG tablet Take 1 tablet (40 mg total) by mouth daily. 90 tablet 2  . Multiple Vitamin (MULTIVITAMIN WITH MINERALS) TABS tablet Take 1 tablet by mouth daily.    . Omega-3 Fatty Acids (FISH OIL) 1000 MG CAPS tAKE 1000MG  TWICE A DAY FOR CHOLESTEROL  0  . ONETOUCH ULTRA test strip USE 1 STRIP TO CHECK GLUCOSE TWICE DAILY AS DIRECTED 100 each 3  . potassium chloride SA (K-DUR,KLOR-CON) 20 MEQ tablet Take 1 tablet (20 mEq total) by mouth 2 (two) times daily. 180 tablet 2  . VYZULTA 0.024 % SOLN      No current facility-administered medications on file prior to visit.    No Known Allergies  Social History   Substance and Sexual Activity  Alcohol Use No  . Alcohol/week: 0.0 standard drinks    Social History   Tobacco Use  Smoking Status Current Every Day Smoker  . Packs/day: 0.50  . Years: 45.00  . Pack years: 22.50  . Types: Cigarettes  Smokeless Tobacco Never Used  Tobacco Comment   1/2 pack a day since age 78    Review of Systems  Constitutional: Negative.   HENT: Negative.   Eyes: Positive for blurred vision.  Respiratory: Negative.   Cardiovascular: Negative.   Gastrointestinal: Negative.   Genitourinary: Positive for frequency.  Musculoskeletal: Negative.   Skin: Negative.   Neurological: Negative.   Endo/Heme/Allergies: Negative.   Psychiatric/Behavioral: Negative.     Objective   Vitals:   05/06/20 1058  BP: (!) 160/75  Pulse: 86  Resp: 14  Temp: 98 F (36.7 C)  SpO2: 96%    Physical Exam Vitals reviewed.  Constitutional:      Appearance: Normal appearance. She is not ill-appearing.  HENT:     Head: Normocephalic and atraumatic.  Cardiovascular:      Rate and Rhythm: Normal rate and regular rhythm.     Heart sounds: Normal heart sounds. No murmur heard.  No friction rub. No gallop.   Pulmonary:     Effort: Pulmonary effort is normal. No respiratory distress.     Breath sounds: Normal breath sounds. No stridor. No wheezing, rhonchi or rales.  Abdominal:     General: Bowel sounds are normal. There is no distension.     Palpations: Abdomen is soft. There is no mass.     Tenderness: There is no abdominal tenderness. There is no guarding or rebound.     Hernia: A hernia is present.     Comments: Reducible epigastric hernia noted just deep to a surgical scar.  Skin:    General: Skin is warm and dry.  Neurological:     Mental Status: She is alert and oriented to person, place, and time.    PCP notes reviewed Assessment  Incisional hernia Plan   Scheduled for incisional herniorrhaphy with mesh on 05/26/20.  The risks and benefits of the procedure including bleeding, infection, mesh use, and the possibility of recurrence of the hernia were fully explained to the patient, who gives informed consent.

## 2020-05-08 ENCOUNTER — Other Ambulatory Visit: Payer: Self-pay | Admitting: Family Medicine

## 2020-05-10 ENCOUNTER — Other Ambulatory Visit: Payer: Self-pay | Admitting: *Deleted

## 2020-05-14 ENCOUNTER — Telehealth: Payer: Self-pay | Admitting: Pharmacist

## 2020-05-14 NOTE — Progress Notes (Cosign Needed)
    Chronic Care Management Pharmacy Assistant   Name: Rebekah Gutierrez  MRN: [id number] DOB: October 01, 1945  Reason for Encounter: Disease State  Patient Questions:  1.  Have you seen any other providers since your last visit? Yes, 04/19/20 Vic Blackbird, MD (PCP), 05/06/20 Laurance Flatten, MD, 05/26/20- Upcoming Hernia Repair Surgery  2.  Any changes in your medicines or health? No    PCP : [name], MD  Allergies:  No Known Allergies  Medications: Outpatient Encounter Medications as of 05/14/2020  Medication Sig  . amLODipine (NORVASC) 10 MG tablet Take 1 tablet (10 mg total) by mouth daily.  Marland Kitchen Apoaequorin (PREVAGEN) 10 MG CAPS Take by mouth.  Marland Kitchen aspirin 325 MG EC tablet Take 325 mg by mouth daily.  Marland Kitchen atorvastatin (LIPITOR) 10 MG tablet Take 1 tablet (10 mg total) by mouth daily.  . brimonidine (ALPHAGAN) 0.2 % ophthalmic solution Place 1 drop into the right eye in the morning and at bedtime.  . calcium carbonate (OS-CAL) 600 MG TABS Take 600 mg by mouth 2 (two) times daily with a meal.  . Cinnamon 500 MG TABS Take 1 tablet by mouth at bedtime.   Marland Kitchen esomeprazole (NEXIUM) 20 MG capsule Take 1 capsule (20 mg total) by mouth daily at 12 noon.  . gabapentin (NEURONTIN) 300 MG capsule Take 1 capsules  BID   for back pain  . glipiZIDE (GLUCOTROL XL) 10 MG 24 hr tablet Take 1 tablet by mouth once daily with breakfast  . Lancets (ONETOUCH DELICA PLUS [id number]) MISC USE 1 LANCET TO CHECK GLUCOSE TWICE DAILY AS DIRECTED  . lisinopril (ZESTRIL) 40 MG tablet Take 1 tablet by mouth once daily  . Multiple Vitamin (MULTIVITAMIN WITH MINERALS) TABS tablet Take 1 tablet by mouth daily.  . Omega-3 Fatty Acids (FISH OIL) 1000 MG CAPS tAKE 1000MG  TWICE A DAY FOR CHOLESTEROL  . ONETOUCH ULTRA test strip USE 1 STRIP TO CHECK GLUCOSE TWICE DAILY AS DIRECTED  . potassium chloride SA (K-DUR,KLOR-CON) 20 MEQ tablet Take 1 tablet (20 mEq total) by mouth 2 (two) times daily.  Marland Kitchen VYZULTA 0.024 % SOLN    No  facility-administered encounter medications on file as of 05/14/2020.    Current Diagnosis: Patient Active Problem List   Diagnosis Date Noted  . Aortic atherosclerosis ([place]) 01/05/2020  . Ventral hernia 06/09/2019  . Hypertriglyceridemia 04/21/2019  . Multiple lung nodules on CT 04/30/2017  . Spinal stenosis of lumbar region 05/02/2016  . DDD (degenerative disc disease), lumbar 05/02/2016  . Constipation 06/15/2015  . OAB (overactive bladder) 11/25/2014  . Pain in the chest 09/02/2014  . Nonspecific abnormal electrocardiogram (ECG) (EKG) 09/02/2014  . Chest pain at rest 09/02/2014  . Dizziness   . Type 2 diabetes mellitus with hyperglycemia ([place]) 01/21/2014  . Fecal incontinence alternating with constipation 01/21/2014  . B12 deficiency 02/06/2013  . Shoulder pain 08/23/2012  . Encounter for screening colonoscopy 06/05/2012  . Leg cramps 04/29/2012  . Vertigo, benign positional 01/28/2012  . Dyspepsia 11/29/2011  . Insomnia 11/29/2011  . Tobacco user 11/29/2011  . Essential hypertension, benign 11/27/2011    Goals Addressed   None     Follow-Up:  Pharmacist Review   Patient has upcoming Hernia Repair Surgery

## 2020-05-20 NOTE — Patient Instructions (Signed)
Rebekah Gutierrez Rebekah Gutierrez  05/20/2020     @PREFPERIOPPHARMACY @   Your procedure is scheduled on  05/26/2020.  Report to [facility] at  [phone]  A.M.  Call this number if you have problems the morning of surgery:  [phone]   Remember:  Do not eat or drink after midnight.                       Take these medicines the morning of surgery with A SIP OF WATER  Amlodipine, nexium. DO NOT take any medications for diabetes the morning of your procedure.    Do not wear jewelry, make-up or nail polish.  Do not wear lotions, powders, or perfumes. Please wear deodorant and brush your teeth.  Do not shave 48 hours prior to surgery.  Men may shave face and neck.  Do not bring valuables to the hospital.  [facility] is not responsible for any belongings or valuables.  Contacts, dentures or bridgework may not be worn into surgery.  Leave your suitcase in the car.  After surgery it may be brought to your room.  For patients admitted to the hospital, discharge time will be determined by your treatment team.  Patients discharged the day of surgery will not be allowed to drive home.   Name and phone number of your driver:   family Special instructions:   DO NOT smoke the morning of your procedure.  Please read over the following fact sheets that you were given. Anesthesia Post-op Instructions and Care and Recovery After Surgery       Open Hernia Repair, Adult, Care After These instructions give you information about caring for yourself after your procedure. Your doctor may also give you more specific instructions. If you have problems or questions, contact your doctor. Follow these instructions at home: Surgical cut (incision) care   Follow instructions from your doctor about how to take care of your surgical cut area. Make sure you: ? Wash your hands with soap and water before you change your bandage (dressing). If you cannot use soap and water, use hand sanitizer. ? Change your bandage  as told by your doctor. ? Leave stitches (sutures), skin glue, or skin tape (adhesive) strips in place. They may need to stay in place for 2 weeks or longer. If tape strips get loose and curl up, you may trim the loose edges. Do not remove tape strips completely unless your doctor says it is okay.  Check your surgical cut every day for signs of infection. Check for: ? More redness, swelling, or pain. ? More fluid or blood. ? Warmth. ? Pus or a bad smell. Activity  Do not drive or use heavy machinery while taking prescription pain medicine. Do not drive until your doctor says it is okay.  Until your doctor says it is okay: ? Do not lift anything that is heavier than 10 lb (4.5 kg). ? Do not play contact sports.  Return to your normal activities as told by your doctor. Ask your doctor what activities are safe. General instructions  To prevent or treat having a hard time pooping (constipation) while you are taking prescription pain medicine, your doctor may recommend that you: ? Drink enough fluid to keep your pee (urine) clear or pale yellow. ? Take over-the-counter or prescription medicines. ? Eat foods that are high in fiber, such as fresh fruits and vegetables, whole grains, and beans. ? Limit foods that  are high in fat and processed sugars, such as fried and sweet foods.  Take over-the-counter and prescription medicines only as told by your doctor.  Do not take baths, swim, or use a hot tub until your doctor says it is okay.  Keep all follow-up visits as told by your doctor. This is important. Contact a doctor if:  You develop a rash.  You have more redness, swelling, or pain around your surgical cut.  You have more fluid or blood coming from your surgical cut.  Your surgical cut feels warm to the touch.  You have pus or a bad smell coming from your surgical cut.  You have a fever or chills.  You have blood in your poop (stool).  You have not pooped in 2-3  days.  Medicine does not help your pain. Get help right away if:  You have chest pain or you are short of breath.  You feel light-headed.  You feel weak and dizzy (feel faint).  You have very bad pain.  You throw up (vomit) and your pain is worse. This information is not intended to replace advice given to you by your health care provider. Make sure you discuss any questions you have with your health care provider. Document Revised: 12/20/2018 Document Reviewed: 02/09/2016 Elsevier Patient Education  2020 [place] Anesthesia, Adult, Care After This sheet gives you information about how to care for yourself after your procedure. Your health care provider may also give you more specific instructions. If you have problems or questions, contact your health care provider. What can I expect after the procedure? After the procedure, the following side effects are common:  Pain or discomfort at the IV site.  Nausea.  Vomiting.  Sore throat.  Trouble concentrating.  Feeling cold or chills.  Weak or tired.  Sleepiness and fatigue.  Soreness and body aches. These side effects can affect parts of the body that were not involved in surgery. Follow these instructions at home:  For at least 24 hours after the procedure:  Have a responsible adult stay with you. It is important to have someone help care for you until you are awake and alert.  Rest as needed.  Do not: ? Participate in activities in which you could fall or become injured. ? Drive. ? Use heavy machinery. ? Drink alcohol. ? Take sleeping pills or medicines that cause drowsiness. ? Make important decisions or sign legal documents. ? Take care of children on your own. Eating and drinking  Follow any instructions from your health care provider about eating or drinking restrictions.  When you feel hungry, start by eating small amounts of foods that are soft and easy to digest (bland), such as toast.  Gradually return to your regular diet.  Drink enough fluid to keep your urine pale yellow.  If you vomit, rehydrate by drinking water, juice, or clear broth. General instructions  If you have sleep apnea, surgery and certain medicines can increase your risk for breathing problems. Follow instructions from your health care provider about wearing your sleep device: ? Anytime you are sleeping, including during daytime naps. ? While taking prescription pain medicines, sleeping medicines, or medicines that make you drowsy.  Return to your normal activities as told by your health care provider. Ask your health care provider what activities are safe for you.  Take over-the-counter and prescription medicines only as told by your health care provider.  If you smoke, do not smoke without supervision.  Keep  all follow-up visits as told by your health care provider. This is important. Contact a health care provider if:  You have nausea or vomiting that does not get better with medicine.  You cannot eat or drink without vomiting.  You have pain that does not get better with medicine.  You are unable to pass urine.  You develop a skin rash.  You have a fever.  You have redness around your IV site that gets worse. Get help right away if:  You have difficulty breathing.  You have chest pain.  You have blood in your urine or stool, or you vomit blood. Summary  After the procedure, it is common to have a sore throat or nausea. It is also common to feel tired.  Have a responsible adult stay with you for the first 24 hours after general anesthesia. It is important to have someone help care for you until you are awake and alert.  When you feel hungry, start by eating small amounts of foods that are soft and easy to digest (bland), such as toast. Gradually return to your regular diet.  Drink enough fluid to keep your urine pale yellow.  Return to your normal activities as told by your  health care provider. Ask your health care provider what activities are safe for you. This information is not intended to replace advice given to you by your health care provider. Make sure you discuss any questions you have with your health care provider. Document Revised: 08/31/2017 Document Reviewed: 04/13/2017 Elsevier Patient Education  [place]. How to Use Chlorhexidine for Bathing Chlorhexidine gluconate (CHG) is a germ-killing (antiseptic) solution that is used to clean the skin. It can get rid of the bacteria that normally live on the skin and can keep them away for about 24 hours. To clean your skin with CHG, you may be given:  A CHG solution to use in the shower or as part of a sponge bath.  A prepackaged cloth that contains CHG. Cleaning your skin with CHG may help lower the risk for infection:  While you are staying in the intensive care unit of the hospital.  If you have a vascular access, such as a central line, to provide short-term or long-term access to your veins.  If you have a catheter to drain urine from your bladder.  If you are on a ventilator. A ventilator is a machine that helps you breathe by moving air in and out of your lungs.  After surgery. What are the risks? Risks of using CHG include:  A skin reaction.  Hearing loss, if CHG gets in your ears.  Eye injury, if CHG gets in your eyes and is not rinsed out.  The CHG product catching fire. Make sure that you avoid smoking and flames after applying CHG to your skin. Do not use CHG:  If you have a chlorhexidine allergy or have previously reacted to chlorhexidine.  On babies younger than 77 months of age. How to use CHG solution  Use CHG only as told by your health care provider, and follow the instructions on the label.  Use the full amount of CHG as directed. Usually, this is one bottle. During a shower Follow these steps when using CHG solution during a shower (unless your health care  provider gives you different instructions): 1. Start the shower. 2. Use your normal soap and shampoo to wash your face and hair. 3. Turn off the shower or move out of the shower  stream. 4. Pour the CHG onto a clean washcloth. Do not use any type of brush or rough-edged sponge. 5. Starting at your neck, lather your body down to your toes. Make sure you follow these instructions: ? If you will be having surgery, pay special attention to the part of your body where you will be having surgery. Scrub this area for at least 1 minute. ? Do not use CHG on your head or face. If the solution gets into your ears or eyes, rinse them well with water. ? Avoid your genital area. ? Avoid any areas of skin that have broken skin, cuts, or scrapes. ? Scrub your back and under your arms. Make sure to wash skin folds. 6. Let the lather sit on your skin for 1-2 minutes or as long as told by your health care provider. 7. Thoroughly rinse your entire body in the shower. Make sure that all body creases and crevices are rinsed well. 8. Dry off with a clean towel. Do not put any substances on your body afterward--such as powder, lotion, or perfume--unless you are told to do so by your health care provider. Only use lotions that are recommended by the manufacturer. 9. Put on clean clothes or pajamas. 10. If it is the night before your surgery, sleep in clean sheets.  During a sponge bath Follow these steps when using CHG solution during a sponge bath (unless your health care provider gives you different instructions): 1. Use your normal soap and shampoo to wash your face and hair. 2. Pour the CHG onto a clean washcloth. 3. Starting at your neck, lather your body down to your toes. Make sure you follow these instructions: ? If you will be having surgery, pay special attention to the part of your body where you will be having surgery. Scrub this area for at least 1 minute. ? Do not use CHG on your head or face. If the  solution gets into your ears or eyes, rinse them well with water. ? Avoid your genital area. ? Avoid any areas of skin that have broken skin, cuts, or scrapes. ? Scrub your back and under your arms. Make sure to wash skin folds. 4. Let the lather sit on your skin for 1-2 minutes or as long as told by your health care provider. 5. Using a different clean, wet washcloth, thoroughly rinse your entire body. Make sure that all body creases and crevices are rinsed well. 6. Dry off with a clean towel. Do not put any substances on your body afterward--such as powder, lotion, or perfume--unless you are told to do so by your health care provider. Only use lotions that are recommended by the manufacturer. 7. Put on clean clothes or pajamas. 8. If it is the night before your surgery, sleep in clean sheets. How to use CHG prepackaged cloths  Only use CHG cloths as told by your health care provider, and follow the instructions on the label.  Use the CHG cloth on clean, dry skin.  Do not use the CHG cloth on your head or face unless your health care provider tells you to.  When washing with the CHG cloth: ? Avoid your genital area. ? Avoid any areas of skin that have broken skin, cuts, or scrapes. Before surgery Follow these steps when using a CHG cloth to clean before surgery (unless your health care provider gives you different instructions): 1. Using the CHG cloth, vigorously scrub the part of your body where you will be having  surgery. Scrub using a back-and-forth motion for 3 minutes. The area on your body should be completely wet with CHG when you are done scrubbing. 2. Do not rinse. Discard the cloth and let the area air-dry. Do not put any substances on the area afterward, such as powder, lotion, or perfume. 3. Put on clean clothes or pajamas. 4. If it is the night before your surgery, sleep in clean sheets.  For general bathing Follow these steps when using CHG cloths for general bathing  (unless your health care provider gives you different instructions). 1. Use a separate CHG cloth for each area of your body. Make sure you wash between any folds of skin and between your fingers and toes. Wash your body in the following order, switching to a new cloth after each step: ? The front of your neck, shoulders, and chest. ? Both of your arms, under your arms, and your hands. ? Your stomach and groin area, avoiding the genitals. ? Your right leg and foot. ? Your left leg and foot. ? The back of your neck, your back, and your buttocks. 2. Do not rinse. Discard the cloth and let the area air-dry. Do not put any substances on your body afterward--such as powder, lotion, or perfume--unless you are told to do so by your health care provider. Only use lotions that are recommended by the manufacturer. 3. Put on clean clothes or pajamas. Contact a health care provider if:  Your skin gets irritated after scrubbing.  You have questions about using your solution or cloth. Get help right away if:  Your eyes become very red or swollen.  Your eyes itch badly.  Your skin itches badly and is red or swollen.  Your hearing changes.  You have trouble seeing.  You have swelling or tingling in your mouth or throat.  You have trouble breathing.  You swallow any chlorhexidine. Summary  Chlorhexidine gluconate (CHG) is a germ-killing (antiseptic) solution that is used to clean the skin. Cleaning your skin with CHG may help to lower your risk for infection.  You may be given CHG to use for bathing. It may be in a bottle or in a prepackaged cloth to use on your skin. Carefully follow your health care provider's instructions and the instructions on the product label.  Do not use CHG if you have a chlorhexidine allergy.  Contact your health care provider if your skin gets irritated after scrubbing. This information is not intended to replace advice given to you by your health care provider.  Make sure you discuss any questions you have with your health care provider. Document Revised: 11/14/2018 Document Reviewed: 07/26/2017 Elsevier Patient Education  [place].

## 2020-05-21 DIAGNOSIS — Z961 Presence of intraocular lens: Secondary | ICD-10-CM | POA: Diagnosis not present

## 2020-05-21 DIAGNOSIS — Z79899 Other long term (current) drug therapy: Secondary | ICD-10-CM | POA: Diagnosis not present

## 2020-05-21 DIAGNOSIS — H401134 Primary open-angle glaucoma, bilateral, indeterminate stage: Secondary | ICD-10-CM | POA: Diagnosis not present

## 2020-05-24 ENCOUNTER — Encounter (HOSPITAL_COMMUNITY): Admission: RE | Admit: 2020-05-24 | Payer: Medicare Other | Source: Ambulatory Visit

## 2020-05-24 ENCOUNTER — Other Ambulatory Visit (HOSPITAL_COMMUNITY)
Admission: RE | Admit: 2020-05-24 | Discharge: 2020-05-24 | Disposition: A | Discharge status: Home / Self Care | Payer: Medicare Other | Source: Ambulatory Visit | Attending: General Surgery | Admitting: General Surgery

## 2020-05-24 ENCOUNTER — Other Ambulatory Visit: Payer: Self-pay

## 2020-05-24 ENCOUNTER — Encounter (HOSPITAL_COMMUNITY)
Admission: RE | Admit: 2020-05-24 | Discharge: 2020-05-24 | Disposition: A | Discharge status: Home / Self Care | Payer: Medicare Other | Source: Ambulatory Visit | Attending: General Surgery | Admitting: General Surgery

## 2020-05-24 ENCOUNTER — Encounter (HOSPITAL_COMMUNITY): Payer: Self-pay

## 2020-05-24 DIAGNOSIS — Z20822 Contact with and (suspected) exposure to covid-19: Secondary | ICD-10-CM | POA: Insufficient documentation

## 2020-05-24 DIAGNOSIS — R9431 Abnormal electrocardiogram [ECG] [EKG]: Secondary | ICD-10-CM | POA: Diagnosis not present

## 2020-05-24 DIAGNOSIS — Z01818 Encounter for other preprocedural examination: Secondary | ICD-10-CM | POA: Insufficient documentation

## 2020-05-25 LAB — SARS CORONAVIRUS 2 (TAT 6-24 HRS): SARS Coronavirus 2: NEGATIVE

## 2020-05-26 ENCOUNTER — Ambulatory Visit (HOSPITAL_COMMUNITY): Payer: Medicare Other | Admitting: Anesthesiology

## 2020-05-26 ENCOUNTER — Encounter (HOSPITAL_COMMUNITY)
Admission: RE | Disposition: A | Discharge status: Home / Self Care | Payer: Self-pay | Source: Ambulatory Visit | Attending: General Surgery

## 2020-05-26 ENCOUNTER — Ambulatory Visit (HOSPITAL_COMMUNITY)
Admission: RE | Admit: 2020-05-26 | Discharge: 2020-05-26 | Disposition: A | Discharge status: Home / Self Care | Payer: Medicare Other | Source: Ambulatory Visit | Attending: General Surgery | Admitting: General Surgery

## 2020-05-26 DIAGNOSIS — F17210 Nicotine dependence, cigarettes, uncomplicated: Secondary | ICD-10-CM | POA: Diagnosis not present

## 2020-05-26 DIAGNOSIS — Z79899 Other long term (current) drug therapy: Secondary | ICD-10-CM | POA: Diagnosis not present

## 2020-05-26 DIAGNOSIS — Z7982 Long term (current) use of aspirin: Secondary | ICD-10-CM | POA: Diagnosis not present

## 2020-05-26 DIAGNOSIS — E119 Type 2 diabetes mellitus without complications: Secondary | ICD-10-CM | POA: Insufficient documentation

## 2020-05-26 DIAGNOSIS — K432 Incisional hernia without obstruction or gangrene: Secondary | ICD-10-CM

## 2020-05-26 DIAGNOSIS — I10 Essential (primary) hypertension: Secondary | ICD-10-CM | POA: Diagnosis not present

## 2020-05-26 DIAGNOSIS — K219 Gastro-esophageal reflux disease without esophagitis: Secondary | ICD-10-CM | POA: Diagnosis not present

## 2020-05-26 DIAGNOSIS — Z7984 Long term (current) use of oral hypoglycemic drugs: Secondary | ICD-10-CM | POA: Diagnosis not present

## 2020-05-26 DIAGNOSIS — M479 Spondylosis, unspecified: Secondary | ICD-10-CM | POA: Insufficient documentation

## 2020-05-26 DIAGNOSIS — Z9049 Acquired absence of other specified parts of digestive tract: Secondary | ICD-10-CM | POA: Diagnosis not present

## 2020-05-26 DIAGNOSIS — K449 Diaphragmatic hernia without obstruction or gangrene: Secondary | ICD-10-CM | POA: Insufficient documentation

## 2020-05-26 DIAGNOSIS — Z8249 Family history of ischemic heart disease and other diseases of the circulatory system: Secondary | ICD-10-CM | POA: Insufficient documentation

## 2020-05-26 HISTORY — PX: INCISIONAL HERNIA REPAIR: SHX193

## 2020-05-26 LAB — GLUCOSE, CAPILLARY
Glucose-Capillary: 140 mg/dL — ABNORMAL HIGH (ref 70–99)
Glucose-Capillary: 143 mg/dL — ABNORMAL HIGH (ref 70–99)

## 2020-05-26 SURGERY — REPAIR, HERNIA, INCISIONAL
Anesthesia: General | Site: Abdomen

## 2020-05-26 MED ORDER — ORAL CARE MOUTH RINSE: 15.0000 mL | Freq: Once | OROMUCOSAL | Status: AC

## 2020-05-26 MED ORDER — LIDOCAINE 2% (20 MG/ML) 5 ML SYRINGE
INTRAMUSCULAR | Status: AC
  Filled 2020-05-26: qty 5

## 2020-05-26 MED ORDER — SUCCINYLCHOLINE CHLORIDE 20 MG/ML IJ SOLN
INTRAMUSCULAR | Status: DC | PRN
  Administered 2020-05-26: 120 mg via INTRAVENOUS

## 2020-05-26 MED ORDER — MIDAZOLAM HCL 2 MG/2ML IJ SOLN
INTRAMUSCULAR | Status: DC | PRN
  Administered 2020-05-26: 2 mg via INTRAVENOUS

## 2020-05-26 MED ORDER — SUCCINYLCHOLINE CHLORIDE 200 MG/10ML IV SOSY
PREFILLED_SYRINGE | INTRAVENOUS | Status: AC
  Filled 2020-05-26: qty 10

## 2020-05-26 MED ORDER — PROPOFOL 10 MG/ML IV BOLUS
INTRAVENOUS | Status: AC
  Filled 2020-05-26: qty 20

## 2020-05-26 MED ORDER — MIDAZOLAM HCL 2 MG/2ML IJ SOLN
INTRAMUSCULAR | Status: AC
  Filled 2020-05-26: qty 2

## 2020-05-26 MED ORDER — BUPIVACAINE LIPOSOME 1.3 % IJ SUSP
INTRAMUSCULAR | Status: AC
  Filled 2020-05-26: qty 20

## 2020-05-26 MED ORDER — TRAMADOL HCL 50 MG PO TABS: 50.0000 mg | ORAL_TABLET | Freq: Four times a day (QID) | ORAL | 0 refills | Status: DC | PRN

## 2020-05-26 MED ORDER — LACTATED RINGERS IV SOLN: INTRAVENOUS | Status: DC | PRN

## 2020-05-26 MED ORDER — ONDANSETRON HCL 4 MG/2ML IJ SOLN
INTRAMUSCULAR | Status: DC | PRN
  Administered 2020-05-26: 4 mg via INTRAVENOUS

## 2020-05-26 MED ORDER — FENTANYL CITRATE (PF) 100 MCG/2ML IJ SOLN
INTRAMUSCULAR | Status: DC | PRN
Start: 2020-05-26 — End: 2020-05-26
  Administered 2020-05-26 (×2): 50 ug via INTRAVENOUS

## 2020-05-26 MED ORDER — CHLORHEXIDINE GLUCONATE CLOTH 2 % EX PADS: 6.0000 | MEDICATED_PAD | Freq: Once | CUTANEOUS | Status: DC

## 2020-05-26 MED ORDER — FENTANYL CITRATE (PF) 100 MCG/2ML IJ SOLN
INTRAMUSCULAR | Status: AC
  Filled 2020-05-26: qty 2

## 2020-05-26 MED ORDER — ROCURONIUM BROMIDE 10 MG/ML (PF) SYRINGE
PREFILLED_SYRINGE | INTRAVENOUS | Status: AC
  Filled 2020-05-26: qty 10

## 2020-05-26 MED ORDER — KETOROLAC TROMETHAMINE 30 MG/ML IJ SOLN
15.0000 mg | Freq: Once | INTRAMUSCULAR | Status: AC
  Administered 2020-05-26: 15 mg via INTRAVENOUS
  Filled 2020-05-26: qty 1

## 2020-05-26 MED ORDER — DEXAMETHASONE SODIUM PHOSPHATE 10 MG/ML IJ SOLN
INTRAMUSCULAR | Status: AC
  Filled 2020-05-26: qty 1

## 2020-05-26 MED ORDER — 0.9 % SODIUM CHLORIDE (POUR BTL) OPTIME
TOPICAL | Status: DC | PRN
  Administered 2020-05-26: 1000 mL

## 2020-05-26 MED ORDER — MEPERIDINE HCL 50 MG/ML IJ SOLN: 6.2500 mg | INTRAMUSCULAR | Status: DC | PRN

## 2020-05-26 MED ORDER — CHLORHEXIDINE GLUCONATE 0.12 % MT SOLN
15.0000 mL | Freq: Once | OROMUCOSAL | Status: AC
  Administered 2020-05-26: 15 mL via OROMUCOSAL

## 2020-05-26 MED ORDER — PROPOFOL 10 MG/ML IV BOLUS
INTRAVENOUS | Status: DC | PRN
  Administered 2020-05-26: 150 mg via INTRAVENOUS

## 2020-05-26 MED ORDER — ONDANSETRON HCL 4 MG/2ML IJ SOLN
INTRAMUSCULAR | Status: AC
  Filled 2020-05-26: qty 2

## 2020-05-26 MED ORDER — ONDANSETRON HCL 4 MG/2ML IJ SOLN: 4.0000 mg | Freq: Once | INTRAMUSCULAR | Status: DC | PRN

## 2020-05-26 MED ORDER — ROCURONIUM BROMIDE 100 MG/10ML IV SOLN
INTRAVENOUS | Status: DC | PRN
  Administered 2020-05-26: 30 mg via INTRAVENOUS

## 2020-05-26 MED ORDER — LACTATED RINGERS IV SOLN: Freq: Once | INTRAVENOUS | Status: AC

## 2020-05-26 MED ORDER — DEXAMETHASONE SODIUM PHOSPHATE 10 MG/ML IJ SOLN
INTRAMUSCULAR | Status: DC | PRN
  Administered 2020-05-26: 4 mg via INTRAVENOUS

## 2020-05-26 MED ORDER — LIDOCAINE HCL (CARDIAC) PF 50 MG/5ML IV SOSY
PREFILLED_SYRINGE | INTRAVENOUS | Status: DC | PRN
  Administered 2020-05-26 (×2): 100 mg via INTRAVENOUS

## 2020-05-26 MED ORDER — CEFAZOLIN SODIUM-DEXTROSE 2-4 GM/100ML-% IV SOLN
2.0000 g | INTRAVENOUS | Status: AC
  Administered 2020-05-26: 2 g via INTRAVENOUS

## 2020-05-26 MED ORDER — SUGAMMADEX SODIUM 500 MG/5ML IV SOLN
INTRAVENOUS | Status: DC | PRN
  Administered 2020-05-26: 200 mg via INTRAVENOUS

## 2020-05-26 MED ORDER — IPRATROPIUM-ALBUTEROL 0.5-2.5 (3) MG/3ML IN SOLN
3.0000 mL | Freq: Once | RESPIRATORY_TRACT | Status: AC
  Administered 2020-05-26: 3 mL via RESPIRATORY_TRACT
  Filled 2020-05-26: qty 3

## 2020-05-26 MED ORDER — CEFAZOLIN SODIUM-DEXTROSE 2-4 GM/100ML-% IV SOLN
INTRAVENOUS | Status: AC
  Filled 2020-05-26: qty 100

## 2020-05-26 MED ORDER — BUPIVACAINE LIPOSOME 1.3 % IJ SUSP
INTRAMUSCULAR | Status: DC | PRN
  Administered 2020-05-26: 15 mL

## 2020-05-26 MED ORDER — HYDROMORPHONE HCL 1 MG/ML IJ SOLN: 0.2500 mg | INTRAMUSCULAR | Status: DC | PRN

## 2020-05-26 SURGICAL SUPPLY — 41 items
ADH SKN CLS APL DERMABOND .7 (GAUZE/BANDAGES/DRESSINGS) ×1
APL PRP STRL LF DISP 70% ISPRP (MISCELLANEOUS) ×1
BLADE SURG SZ11 CARB STEEL (BLADE) ×2 IMPLANT
CHLORAPREP W/TINT 26 (MISCELLANEOUS) ×2 IMPLANT
CLOTH BEACON ORANGE TIMEOUT ST (SAFETY) ×2 IMPLANT
COVER LIGHT HANDLE STERIS (MISCELLANEOUS) ×4 IMPLANT
COVER WAND RF STERILE (DRAPES) ×2 IMPLANT
DERMABOND ADVANCED (GAUZE/BANDAGES/DRESSINGS) ×1
DERMABOND ADVANCED .7 DNX12 (GAUZE/BANDAGES/DRESSINGS) ×1 IMPLANT
ELECT REM PT RETURN 9FT ADLT (ELECTROSURGICAL) ×2
ELECTRODE REM PT RTRN 9FT ADLT (ELECTROSURGICAL) ×1 IMPLANT
GLOVE BIOGEL PI IND STRL 7.0 (GLOVE) ×2 IMPLANT
GLOVE BIOGEL PI INDICATOR 7.0 (GLOVE) ×2
GLOVE SURG SS PI 7.5 STRL IVOR (GLOVE) ×2 IMPLANT
GOWN STRL REUS W/TWL LRG LVL3 (GOWN DISPOSABLE) ×6 IMPLANT
INST SET MAJOR GENERAL (KITS) IMPLANT
INST SET MINOR GENERAL (KITS) IMPLANT
KIT TURNOVER KIT A (KITS) ×2 IMPLANT
LIGASURE IMPACT 36 18CM CVD LR (INSTRUMENTS) ×1 IMPLANT
MANIFOLD NEPTUNE II (INSTRUMENTS) ×2 IMPLANT
MESH VENTRALEX ST 2.5 CRC MED (Mesh General) ×1 IMPLANT
NDL HYPO 21X1.5 SAFETY (NEEDLE) ×1 IMPLANT
NEEDLE HYPO 21X1.5 SAFETY (NEEDLE) ×2 IMPLANT
NS IRRIG 1000ML POUR BTL (IV SOLUTION) ×2 IMPLANT
PACK ABDOMINAL MAJOR (CUSTOM PROCEDURE TRAY) ×1 IMPLANT
PACK MINOR (CUSTOM PROCEDURE TRAY) IMPLANT
PAD ARMBOARD 7.5X6 YLW CONV (MISCELLANEOUS) ×2 IMPLANT
PENCIL SMOKE EVACUATOR (MISCELLANEOUS) ×2 IMPLANT
SET BASIN LINEN APH (SET/KITS/TRAYS/PACK) ×2 IMPLANT
SUT ETHIBOND 0 MO6 C/R (SUTURE) ×1 IMPLANT
SUT MNCRL AB 4-0 PS2 18 (SUTURE) ×1 IMPLANT
SUT NOVA NAB GS-22 2 2-0 T-19 (SUTURE) IMPLANT
SUT PROLENE 0 CT 1 CR/8 (SUTURE) IMPLANT
SUT SILK 2 0 (SUTURE)
SUT SILK 2-0 18XBRD TIE 12 (SUTURE) IMPLANT
SUT VIC AB 2-0 CT1 27 (SUTURE) ×2
SUT VIC AB 2-0 CT1 TAPERPNT 27 (SUTURE) ×1 IMPLANT
SUT VIC AB 3-0 SH 27 (SUTURE) ×2
SUT VIC AB 3-0 SH 27X BRD (SUTURE) ×1 IMPLANT
SUT VICRYL AB 2 0 TIES (SUTURE) IMPLANT
SYR 20ML LL LF (SYRINGE) ×4 IMPLANT

## 2020-05-26 NOTE — Op Note (Signed)
Patient:  Rebekah Gutierrez. [place]ame  DOB:  Dec 05, 1945  MRN:  [id number]   Preop Diagnosis: Incisional hernia  Postop Diagnosis: Same  Procedure: Incisional herniorrhaphy with mesh  Surgeon: Aviva Signs, MD  Anes: General endotracheal  Indications: Patient is a 74 year old black female status post laparoscopic procedure in the remote past who presents with an epigastric incisional hernia.  The risks and benefits of the procedure including bleeding, infection, mesh use, and the possibility of recurrence of the hernia were fully explained to the patient, who gave informed consent.  Procedure note: The patient was placed in the supine position.  After induction of general endotracheal anesthesia, the abdomen was prepped and draped using usual sterile technique with ChloraPrep.  Surgical site confirmation was performed.  The patient's hernia was in the epigastric region.  A midline incision was made just over the hernia and just inferior to the subxiphoid process.  The dissection was taken down to the fascia.  The patient had a hernia sac which contained omentum and part of the falciform ligament.  The hernia sac was excised.  The excess omentum was excised using the LigaSure in order to reduce it.  Upon reduction, the ventral abdominal wall was inspected and no other hernias were noted.  No adhesions were present.  A Bard 6.3 cm Ventralax ST patch was then inserted and secured to the fascia using 0 Ethibond interrupted sutures.  I was unable to close the fascia overlying the repair.  The subcutaneous layer was reapproximated using a 3-0 Vicryl interrupted suture.  Exparel was instilled into the surrounding wound.  The skin was closed using a 4-0 Monocryl subcuticular suture.  Dermabond was applied.  All tape and needle counts were correct at the end of the procedure.  The patient was extubated in the operating room and transferred to PACU in stable condition.  Complications: None  EBL: Minimal  Specimen:  None

## 2020-05-26 NOTE — Interval H&P Note (Signed)
History and Physical Interval Note:  05/26/2020 7:19 AM  Rebekah Gutierrez  has presented today for surgery, with the diagnosis of Incisional hernia.  The various methods of treatment have been discussed with the patient and family. After consideration of risks, benefits and other options for treatment, the patient has consented to  Procedure(s): [place] (N/A) as a surgical intervention.  The patient's history has been reviewed, patient examined, no change in status, stable for surgery.  I have reviewed the patient's chart and labs.  Questions were answered to the patient's satisfaction.     Aviva Signs

## 2020-05-26 NOTE — Progress Notes (Signed)
Pt states someone will be staying with her tonight. No name given.

## 2020-05-26 NOTE — Anesthesia Procedure Notes (Signed)
Procedure Name: Intubation Performed by: Tacy Learn, CRNA Pre-anesthesia Checklist: Patient identified, Emergency Drugs available, Suction available, Patient being monitored and Timeout performed Patient Re-evaluated:Patient Re-evaluated prior to induction Oxygen Delivery Method: Circle system utilized Preoxygenation: Pre-oxygenation with 100% oxygen Induction Type: IV induction Laryngoscope Size: Mac and 3 Grade View: Grade II Tube size: 7.0 mm Number of attempts: 1 Airway Equipment and Method: Stylet Placement Confirmation: ETT inserted through vocal cords under direct vision,  positive ETCO2,  CO2 detector and breath sounds checked- equal and bilateral Secured at: 21 cm Tube secured with: Tape Dental Injury: Teeth and Oropharynx as per pre-operative assessment  Comments: Med student DLx2, surgeon x1.

## 2020-05-26 NOTE — Discharge Instructions (Signed)
[facility] [place] [facility] UNTIL Sunday May 30, 2020. DO NOT USE ADDITIONAL NUMBING MEDICATIONS WITHOUT CONSULTING A PHYSICIAN      Open Hernia Repair, Adult, Care After This sheet gives you information about how to care for yourself after your procedure. Your health care provider may also give you more specific instructions. If you have problems or questions, contact your health care provider. What can I expect after the procedure? After the procedure, it is common to have:  Mild discomfort.  Slight bruising.  Minor swelling.  Pain in the abdomen. Follow these instructions at home: Incision care   Follow instructions from your health care provider about how to take care of your incision area. Make sure you: ? Wash your hands with soap and water before you change your bandage (dressing). If soap and water are not available, use hand sanitizer. ? Change your dressing as told by your health care provider. ? Leave stitches (sutures), skin glue, or adhesive strips in place. These skin closures may need to stay in place for 2 weeks or longer. If adhesive strip edges start to loosen and curl up, you may trim the loose edges. Do not remove adhesive strips completely unless your health care provider tells you to do that.  Check your incision area every day for signs of infection. Check for: ? More redness, swelling, or pain. ? More fluid or blood. ? Warmth. ? Pus or a bad smell. Activity  Do not drive or use heavy machinery while taking prescription pain medicine. Do not drive until your health care provider approves.  Until your health care provider approves: ? Do not lift anything that is heavier than 10 lb (4.5 kg). ? Do not play contact sports.  Return to your normal activities as told by your health care provider. Ask your health care provider what activities are safe. General instructions  To prevent or treat constipation while you are taking  prescription pain medicine, your health care provider may recommend that you: ? Drink enough fluid to keep your urine clear or pale yellow. ? Take over-the-counter or prescription medicines. ? Eat foods that are high in fiber, such as fresh fruits and vegetables, whole grains, and beans. ? Limit foods that are high in fat and processed sugars, such as fried and sweet foods.  Take over-the-counter and prescription medicines only as told by your health care provider.  Do not take tub baths or go swimming until your health care provider approves.  Keep all follow-up visits as told by your health care provider. This is important. Contact a health care provider if:  You develop a rash.  You have more redness, swelling, or pain around your incision.  You have more fluid or blood coming from your incision.  Your incision feels warm to the touch.  You have pus or a bad smell coming from your incision.  You have a fever or chills.  You have blood in your stool (feces).  You have not had a bowel movement in 2-3 days.  Your pain is not controlled with medicine. Get help right away if:  You have chest pain or shortness of breath.  You feel light-headed or feel faint.  You have severe pain.  You vomit and your pain is worse. This information is not intended to replace advice given to you by your health care provider. Make sure you discuss any questions you have with your health care provider. Document Revised: 08/10/2017 Document Reviewed: 02/09/2016  Elsevier Patient Education  2020 [place] Anesthesia, Adult, Care After This sheet gives you information about how to care for yourself after your procedure. Your health care provider may also give you more specific instructions. If you have problems or questions, contact your health care provider. What can I expect after the procedure? After the procedure, the following side effects are common:  Pain or discomfort  at the IV site.  Nausea.  Vomiting.  Sore throat.  Trouble concentrating.  Feeling cold or chills.  Weak or tired.  Sleepiness and fatigue.  Soreness and body aches. These side effects can affect parts of the body that were not involved in surgery. Follow these instructions at home:  For at least 24 hours after the procedure:  Have a responsible adult stay with you. It is important to have someone help care for you until you are awake and alert.  Rest as needed.  Do not: ? Participate in activities in which you could fall or become injured. ? Drive. ? Use heavy machinery. ? Drink alcohol. ? Take sleeping pills or medicines that cause drowsiness. ? Make important decisions or sign legal documents. ? Take care of children on your own. Eating and drinking  Follow any instructions from your health care provider about eating or drinking restrictions.  When you feel hungry, start by eating small amounts of foods that are soft and easy to digest (bland), such as toast. Gradually return to your regular diet.  Drink enough fluid to keep your urine pale yellow.  If you vomit, rehydrate by drinking water, juice, or clear broth. General instructions  If you have sleep apnea, surgery and certain medicines can increase your risk for breathing problems. Follow instructions from your health care provider about wearing your sleep device: ? Anytime you are sleeping, including during daytime naps. ? While taking prescription pain medicines, sleeping medicines, or medicines that make you drowsy.  Return to your normal activities as told by your health care provider. Ask your health care provider what activities are safe for you.  Take over-the-counter and prescription medicines only as told by your health care provider.  If you smoke, do not smoke without supervision.  Keep all follow-up visits as told by your health care provider. This is important. Contact a health care provider  if:  You have nausea or vomiting that does not get better with medicine.  You cannot eat or drink without vomiting.  You have pain that does not get better with medicine.  You are unable to pass urine.  You develop a skin rash.  You have a fever.  You have redness around your IV site that gets worse. Get help right away if:  You have difficulty breathing.  You have chest pain.  You have blood in your urine or stool, or you vomit blood. Summary  After the procedure, it is common to have a sore throat or nausea. It is also common to feel tired.  Have a responsible adult stay with you for the first 24 hours after general anesthesia. It is important to have someone help care for you until you are awake and alert.  When you feel hungry, start by eating small amounts of foods that are soft and easy to digest (bland), such as toast. Gradually return to your regular diet.  Drink enough fluid to keep your urine pale yellow.  Return to your normal activities as told by your health care provider. Ask your health  care provider what activities are safe for you. This information is not intended to replace advice given to you by your health care provider. Make sure you discuss any questions you have with your health care provider. Document Revised: 08/31/2017 Document Reviewed: 04/13/2017 Elsevier Patient Education  [place].

## 2020-05-26 NOTE — Anesthesia Preprocedure Evaluation (Signed)
Anesthesia Evaluation  Patient identified by MRN, date of birth, ID band Patient awake    Reviewed: Allergy & Precautions, NPO status , Patient's Chart, lab work & pertinent test results  History of Anesthesia Complications Negative for: history of anesthetic complications  Airway Mallampati: II  TM Distance: >3 FB Neck ROM: Full   Comment: Cervical spine sx Dental  (+) Edentulous Upper, Edentulous Lower   Pulmonary Current Smoker and Patient abstained from smoking.,  Multiple lung nodules on CT, room air sat 92   Pulmonary exam normal breath sounds clear to auscultation       Cardiovascular Exercise Tolerance: Good hypertension, Pt. on medications Normal cardiovascular exam Rhythm:Regular Rate:Normal     Neuro/Psych negative psych ROS   GI/Hepatic hiatal hernia, GERD  Medicated and Controlled,  Endo/Other  diabetes, Well Controlled, Type 2, Oral Hypoglycemic Agents  Renal/GU      Musculoskeletal  (+) Arthritis , Osteoarthritis,  Cervical spine sx   Abdominal   Peds  Hematology   Anesthesia Other Findings   Reproductive/Obstetrics                            Anesthesia Physical Anesthesia Plan  ASA: II  Anesthesia Plan: General   Post-op Pain Management:    Induction: Intravenous  PONV Risk Score and Plan: Ondansetron  Airway Management Planned: Oral ETT  Additional Equipment:   Intra-op Plan:   Post-operative Plan:   Informed Consent: I have reviewed the patients History and Physical, chart, labs and discussed the procedure including the risks, benefits and alternatives for the proposed anesthesia with the patient or authorized representative who has indicated his/her understanding and acceptance.     Dental advisory given  Plan Discussed with: CRNA and Surgeon  Anesthesia Plan Comments:        Anesthesia Quick Evaluation

## 2020-05-26 NOTE — Anesthesia Postprocedure Evaluation (Signed)
Anesthesia Post Note  Patient: Rebekah Gutierrez. [name]  Procedure(s) Performed: INCISIONAL HERNIORRHAPY WITH MESH (N/A Abdomen)  Patient location during evaluation: PACU Anesthesia Type: General Level of consciousness: oriented, awake, awake and alert and patient cooperative Pain management: pain level controlled Vital Signs Assessment: post-procedure vital signs reviewed and stable Respiratory status: spontaneous breathing, respiratory function stable, nonlabored ventilation and aerosol facemask Cardiovascular status: blood pressure returned to baseline and stable Postop Assessment: no headache and no backache Anesthetic complications: no   No complications documented.   Last Vitals:  Vitals:   05/26/20 0650 05/26/20 0700  BP: 132/83 123/86  Pulse:  70  Resp: (!) 21 19  Temp: 36.8 C   SpO2: 96% 94%    Last Pain:  Vitals:   05/26/20 0650  PainSc: 0-No pain                 Tacy Learn

## 2020-05-26 NOTE — Transfer of Care (Signed)
Immediate Anesthesia Transfer of Care Note  Patient: Creta Dorame. [name]  Procedure(s) Performed: Jerry Caras WITH MESH (N/A Abdomen)  Patient Location: PACU  Anesthesia Type:General  Level of Consciousness: awake, alert , oriented and patient cooperative  Airway & Oxygen Therapy: Patient Spontanous Breathing and Patient connected to face mask oxygen  Post-op Assessment: Report given to RN, Post -op Vital signs reviewed and stable and Patient moving all extremities  Post vital signs: Reviewed and stable  Last Vitals:  Vitals Value Taken Time  BP 141/89 05/26/20 0830  Temp    Pulse    Resp 17 05/26/20 0833  SpO2    Vitals shown include unvalidated device data.  Last Pain:  Vitals:   05/26/20 0650  PainSc: 0-No pain         Complications: No complications documented.

## 2020-05-27 ENCOUNTER — Encounter (HOSPITAL_COMMUNITY): Payer: Self-pay | Admitting: General Surgery

## 2020-06-01 ENCOUNTER — Other Ambulatory Visit: Payer: Self-pay | Admitting: Family Medicine

## 2020-06-01 MED ORDER — ATORVASTATIN CALCIUM 20 MG PO TABS
10.0000 mg | ORAL_TABLET | Freq: Every day | ORAL | 3 refills | Status: DC
Start: 2020-06-01 — End: 2020-07-19

## 2020-06-04 ENCOUNTER — Ambulatory Visit: Payer: Self-pay | Admitting: General Surgery

## 2020-06-15 ENCOUNTER — Other Ambulatory Visit: Payer: Self-pay

## 2020-06-15 ENCOUNTER — Ambulatory Visit (INDEPENDENT_AMBULATORY_CARE_PROVIDER_SITE_OTHER): Payer: Medicare Other | Admitting: Physical Medicine and Rehabilitation

## 2020-06-15 ENCOUNTER — Encounter: Payer: Self-pay | Admitting: Physical Medicine and Rehabilitation

## 2020-06-15 VITALS — BP 130/78 | HR 77

## 2020-06-15 DIAGNOSIS — M961 Postlaminectomy syndrome, not elsewhere classified: Secondary | ICD-10-CM

## 2020-06-15 DIAGNOSIS — Z981 Arthrodesis status: Secondary | ICD-10-CM | POA: Diagnosis not present

## 2020-06-15 DIAGNOSIS — M5416 Radiculopathy, lumbar region: Secondary | ICD-10-CM

## 2020-06-15 DIAGNOSIS — M48062 Spinal stenosis, lumbar region with neurogenic claudication: Secondary | ICD-10-CM | POA: Diagnosis not present

## 2020-06-15 DIAGNOSIS — M47816 Spondylosis without myelopathy or radiculopathy, lumbar region: Secondary | ICD-10-CM | POA: Diagnosis not present

## 2020-06-15 NOTE — Progress Notes (Signed)
Low back pain and right buttock pain. Pain mostly radiates down right posterior leg to feet. Sometimes has left buttock and leg pain as well. Pain is worse first thing in the morning and decreases throughout the day. Numeric Pain Rating Scale and Functional Assessment Average Pain 5 Pain Right Now 2 My pain is intermittent and aching Pain is worse with: walking, standing and sitting in a certain way Pain improves with: rest and medication   In the last MONTH (on 0-10 scale) has pain interfered with the following?  1. General activity like being  able to carry out your everyday physical activities such as walking, climbing stairs, carrying groceries, or moving a chair?  Rating(2)  2. Relation with others like being able to carry out your usual social activities and roles such as  activities at home, at work and in your community. Rating(2)  3. Enjoyment of life such that you have  been bothered by emotional problems such as feeling anxious, depressed or irritable?  Rating(5)

## 2020-06-17 ENCOUNTER — Ambulatory Visit (INDEPENDENT_AMBULATORY_CARE_PROVIDER_SITE_OTHER): Payer: Medicare Other | Admitting: Physical Medicine and Rehabilitation

## 2020-06-17 ENCOUNTER — Encounter: Payer: Self-pay | Admitting: Physical Medicine and Rehabilitation

## 2020-06-17 ENCOUNTER — Ambulatory Visit: Payer: Self-pay

## 2020-06-17 VITALS — BP 149/90 | HR 84

## 2020-06-17 DIAGNOSIS — M5416 Radiculopathy, lumbar region: Secondary | ICD-10-CM | POA: Diagnosis not present

## 2020-06-17 MED ORDER — METHYLPREDNISOLONE ACETATE 80 MG/ML IJ SUSP
80.0000 mg | Freq: Once | INTRAMUSCULAR | Status: AC
  Administered 2020-06-17: 80 mg

## 2020-06-17 NOTE — Progress Notes (Signed)
Pt state lower back pain. Pt state walking, standing and laying down makes the pain worse. Pt state pain meds helps ease the pain.  Numeric Pain Rating Scale and Functional Assessment Average Pain 5   In the last MONTH (on 0-10 scale) has pain interfered with the following?  1. General activity like being  able to carry out your everyday physical activities such as walking, climbing stairs, carrying groceries, or moving a chair?  Rating(8)   +Driver, -BT, -Dye Allergies.

## 2020-07-02 DIAGNOSIS — H401121 Primary open-angle glaucoma, left eye, mild stage: Secondary | ICD-10-CM | POA: Diagnosis not present

## 2020-07-02 DIAGNOSIS — H401113 Primary open-angle glaucoma, right eye, severe stage: Secondary | ICD-10-CM | POA: Diagnosis not present

## 2020-07-02 DIAGNOSIS — Z961 Presence of intraocular lens: Secondary | ICD-10-CM | POA: Diagnosis not present

## 2020-07-12 ENCOUNTER — Encounter: Payer: Self-pay | Admitting: Physical Medicine and Rehabilitation

## 2020-07-12 NOTE — Progress Notes (Signed)
Rebekah Gutierrez - 74 y.o. female MRN [id number]  Date of birth: 1946-08-29  Office Visit Note: Visit Date: 06/15/2020 PCP: [name], MD Referred by: [name], MD  Subjective: Chief Complaint  Patient presents with  . Lower Back - Pain   HPI: Rebekah Brickley. Gutierrez is a 74 y.o. female who comes in today For new patient evaluation and management at the request of her primary care physician Vic Blackbird, MD.  Patient has a history of chronic back pain with bilateral radicular pain.  She has undergone L4-5 PLIF by Dr. Christella Noa at [facility].  She did have some injections around the time of the surgery itself.  She has not had a recent injection of the lumbar spine.  Today she is complaining of 5 out of 10 intermittent and aching pain particularly in the lower back and right buttock.  She reports the pain is mostly referring down the right posterior leg to the foot and sometimes the left buttock but mainly the right.  She reports this is worse first thing in the morning and decreases throughout the day.  She gets some relief with rest and medication.  She has been taking some tramadol through Dr. Aviva Signs who is treating her for hernia.  Her case is complicated by type 2 diabetes.  She has had a CT scan of the lumbar spine with contrast performed in 2017 and newer plain x-rays of the lumbar spine and bilateral hip and pelvis in 2019.  These were reviewed with the patient today showing images and spine model.  Hip x-rays show very minimal degenerative joint changes.  She does have significant arthritis particularly adjacent level disease above the L4-5 fusion.  Her case is complicated by numerous medical issues but in particular history of B12 deficiency and diabetes but no known history of polyneuropathy.  No electrodiagnostic studies have been performed.  She does not really have bilateral foot numbness.  She does get cramping.  She has had no focal weakness or  trauma or red flag complaints.  Continues to use mainly Tylenol and gabapentin with some tramadol.  Review of Systems  Musculoskeletal: Positive for back pain.       Right radicular leg pain  Neurological: Positive for tingling.  All other systems reviewed and are negative.  Otherwise per HPI.  Assessment & Plan: Visit Diagnoses:  1. Lumbar radiculopathy   2. Spinal stenosis of lumbar region with neurogenic claudication   3. Spondylosis without myelopathy or radiculopathy, lumbar region   4. Post laminectomy syndrome   5. S/P lumbar fusion     Plan: Findings:  Chronic worsening severe low back and right radicular leg pain in the setting of prior L4-5 PLIF by Dr. Christella Noa with history of some injections in the past but not recently.  She has had physical therapy in the past and does do home exercises and tries to stay active.  She manages pain with Tylenol and gabapentin and now taking some tramadol for hernia treatment by Dr. Aviva Signs.  Followed closely by her primary care physician.  Case complicated by diabetes with last hemoglobin A1c of 8.6.  At this point given the severity of symptoms and really no red flag complaints and at least CT scan showing adjacent level disease a few years ago I think it would be wise to complete right versus bilateral L3 transforaminal epidural steroid injection.  Depending on relief would look at MRI or repeat CT scan.  Spoke at length with patient today reviewing images and options for greater than 45 minutes.    Meds & Orders: No orders of the defined types were placed in this encounter.  No orders of the defined types were placed in this encounter.   Follow-up: Return for Right versus bilateral L3 transforaminal injection.   Procedures: No procedures performed  No notes on file   Clinical History: LUMBAR SPINE - COMPLETE 4+ VIEW  COMPARISON:  Radiographs dated 07/26/2010 and CT scan of the lumbar spine dated 05/23/2016  FINDINGS: Previous  solid interbody and posterior fusion at L4-5.  No fracture or bone destruction. Alignment is normal. Moderate bilateral facet arthritis at L3-4. Slight narrowing of the L5-S1 disc space. Mild to moderate bilateral facet arthritis at L5-S1.  IMPRESSION: No acute abnormalities of the lumbar spine. 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 Chronic degenerative disc disease at L5-S1.   Electronically Signed   By: Lorriane Shire M.D.   On: 05/22/2018 09:51 --- CT LUMBAR SPINE WITH CONTRAST    TECHNIQUE:  Multidetector CT imaging of the lumbar spine was performed with  intravenous contrast administration. Multiplanar CT image  reconstructions were also generated.    CONTRAST: 67mL ISOVUE-300 IOPAMIDOL (ISOVUE-300) INJECTION 61%    COMPARISON: Lumbar spine radiograph 12/06/2009    FINDINGS:  The patient is status post posterior spinal fusion at L4-L5 with  associated disc spacer material, posterior spinal rods and bilateral  transpedicular screws. There is no abnormal lucency about the screws  to suggest loosening or infection. There is trace anterolisthesis at  this level. No abnormal translation or subsidence of the disc  spacer.    The disc spaces above the L2-3 level are maintained without spinal  canal or neural foraminal stenosis. At L3-L4, there is ligamentum  flavum redundancy and small disc bulge resulting in moderate spinal  canal narrowing. There is posterior decompression at the L4-L5 level  without residual spinal canal stenosis. At L5-S1, there is a central  disc protrusion with mild narrowing of the spinal canal.    Calcified fibroid noted within the uterus. Visualized abdominal soft  tissues otherwise unremarkable aside from atherosclerotic  calcification of the abdominal aorta.    IMPRESSION:  1. Posterior spinal fusion at L4-L5 without hardware adverse  features. Unchanged trace anterolisthesis.  2. 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  3. Aortic atherosclerosis.      Electronically Signed  By: Ulyses Jarred M.D.  On: 05/24/2016 02:12   She reports that she has been smoking cigarettes. She has a 22.50 pack-year smoking history. She has never used smokeless tobacco.  Recent Labs    12/17/19 1243 04/19/20 1134  HGBA1C 8.6* 6.5*    Objective:  VS:  HT:    WT:   BMI:     BP:130/78  HR:77bpm  TEMP: ( )  RESP:  Physical Exam Vitals and nursing note reviewed.  Constitutional:      General: She is not in acute distress.    Appearance: Normal appearance. She is well-developed. She is not ill-appearing.  HENT:     Head: Normocephalic and atraumatic.  Eyes:     Conjunctiva/sclera: Conjunctivae normal.     Pupils: Pupils are equal, round, and reactive to light.  Cardiovascular:     Rate and Rhythm: Normal rate.     Pulses: Normal pulses.  Pulmonary:     Effort: Pulmonary effort is normal.  Musculoskeletal:        General:  Tenderness present. No signs of injury.     Cervical back: Neck supple. Tenderness present. No rigidity.     Right lower leg: No edema.     Left lower leg: No edema.     Comments: Patient is slow to rise from a seated position to full extension.  She ambulates with a forward flexed lumbar spine.  She does have some paraspinal tenderness and trigger points with very tight bands of muscle through the lower spine.  She has pain with extension rotation and facet loading.  She has pain over the PSIS on the right but not the left.  No pain over the trochanters.  No pain with hip rotation.  She has good distal strength.  No clonus.  Lymphadenopathy:     Cervical: No cervical adenopathy.  Skin:    General: Skin is warm and dry.     Findings: No erythema or rash.  Neurological:     General: No focal deficit present.     Mental Status: She is alert and oriented to person, place, and time.     Sensory: Sensory deficit present.     Motor: No abnormal muscle  tone.     Coordination: Coordination normal.     Gait: Gait abnormal.  Psychiatric:        Mood and Affect: Mood normal.        Behavior: Behavior normal.     Ortho Exam  Imaging: No results found.  Past Medical/Family/Surgical/Social History: Medications & Allergies reviewed per EMR, new medications updated. Patient Active Problem List   Diagnosis Date Noted  . Incisional hernia, without obstruction or gangrene   . Aortic atherosclerosis ([place]) 01/05/2020  . Ventral hernia 06/09/2019  . Hypertriglyceridemia 04/21/2019  . Multiple lung nodules on CT 04/30/2017  . Spinal stenosis of lumbar region 05/02/2016  . DDD (degenerative disc disease), lumbar 05/02/2016  . Constipation 06/15/2015  . OAB (overactive bladder) 11/25/2014  . Pain in the chest 09/02/2014  . Nonspecific abnormal electrocardiogram (ECG) (EKG) 09/02/2014  . Chest pain at rest 09/02/2014  . Dizziness   . Type 2 diabetes mellitus with hyperglycemia ([place]) 01/21/2014  . Fecal incontinence alternating with constipation 01/21/2014  . B12 deficiency 02/06/2013  . Shoulder pain 08/23/2012  . Encounter for screening colonoscopy 06/05/2012  . Leg cramps 04/29/2012  . Vertigo, benign positional 01/28/2012  . Dyspepsia 11/29/2011  . Insomnia 11/29/2011  . Tobacco user 11/29/2011  . Essential hypertension, benign 11/27/2011   Past Medical History:  Diagnosis Date  . Diabetes ([place])   . GERD (gastroesophageal reflux disease)   . Hiatal hernia   . Hypertension   . Vertigo    Family History  Problem Relation Age of Onset  . Hypertension Mother   . Stroke Mother   . Hypertension Father   . Cancer Father   . Colon cancer Neg Hx    Past Surgical History:  Procedure Laterality Date  . BACK SURGERY     L4-5 PLIF  . BLADDER SURGERY    . CERVICAL SPINE SURGERY     fusion  . CHOLECYSTECTOMY, LAPAROSCOPIC     Remote Past  . COLONOSCOPY  06/20/2012   Procedure: COLONOSCOPY;  Surgeon: Rogene Houston, MD;   Location: [facility];  Service: Endoscopy;  Laterality: N/A;  250  . FOOT SURGERY     left   . HAND SURGERY     left  . INCISIONAL HERNIA REPAIR N/A 05/26/2020   Procedure: INCISIONAL HERNIORRHAPY WITH MESH;  Surgeon: Aviva Signs, MD;  Location: [facility] ORS;  Service: General;  Laterality: N/A;  . TUBAL LIGATION     Social History   Occupational History  . Not on file  Tobacco Use  . Smoking status: Current Every Day Smoker    Packs/day: 0.50    Years: 45.00    Pack years: 22.50    Types: Cigarettes  . Smokeless tobacco: Never Used  . Tobacco comment: 1/2 pack a day since age 67  Substance and Sexual Activity  . Alcohol use: No    Alcohol/week: 0.0 standard drinks  . Drug use: No  . Sexual activity: Yes    Birth control/protection: Post-menopausal, Surgical

## 2020-07-12 NOTE — Progress Notes (Signed)
Rebekah Gutierrez - 74 y.o. female MRN [id number]  Date of birth: Jul 16, 1946  Office Visit Note: Visit Date: 06/17/2020 PCP: [name], MD Referred by: [name], MD  Subjective: Chief Complaint  Patient presents with  . Lower Back - Pain   HPI:  Rebekah Gutierrez. Rebekah Gutierrez is a 74 y.o. female who comes in today at the request of Dr. Laurence Spates for planned Right L5-S1 Lumbar epidural steroid injection with fluoroscopic guidance.  The patient has failed conservative care including home exercise, medications, time and activity modification.  This injection will be diagnostic and hopefully therapeutic.  Please see requesting physician notes for further details and justification.  I was able to see where an L5-S1 interlaminar injection had helped her in the past so we decided to do that today.  Depending on relief would look at L3 transforaminal injection or updated imaging.  ROS Otherwise per HPI.  Assessment & Plan: Visit Diagnoses:  1. Lumbar radiculopathy     Plan: No additional findings.   Meds & Orders:  Meds ordered this encounter  Medications  . methylPREDNISolone acetate (DEPO-MEDROL) injection 80 mg    Orders Placed This Encounter  Procedures  . XR C-ARM NO REPORT  . Epidural Steroid injection    Follow-up: Return if symptoms worsen or fail to improve.   Procedures: No procedures performed  Lumbar Epidural Steroid Injection - Interlaminar Approach with Fluoroscopic Guidance  Patient: Rebekah Gutierrez. Rebekah Gutierrez      Date of Birth: 06-Jun-1946 MRN: [id number] PCP: [name], MD      Visit Date: 06/17/2020   Universal Protocol:     Consent Given By: the patient  Position: PRONE  Additional Comments: Vital signs were monitored before and after the procedure. Patient was prepped and draped in the usual sterile fashion. The correct patient, procedure, and site was verified.   Injection Procedure Details:   Procedure diagnoses:  1. Lumbar radiculopathy      Meds  Administered:  Meds ordered this encounter  Medications  . methylPREDNISolone acetate (DEPO-MEDROL) injection 80 mg     Laterality: Right  Location/Site:  L5-S1  Needle size: 20 G  Needle type: Tuohy  Needle Placement: Paramedian epidural  Findings:   -Comments: Excellent flow of contrast into the epidural space.  Procedure Details: Using a paramedian approach from the side mentioned above, the region overlying the inferior lamina was localized under fluoroscopic visualization and the soft tissues overlying this structure were infiltrated with 4 ml. of 1% Lidocaine without Epinephrine. The Tuohy needle was inserted into the epidural space using a paramedian approach.   The epidural space was localized using loss of resistance along with counter oblique bi-planar fluoroscopic views.  After negative aspirate for air, blood, and CSF, a 2 ml. volume of Isovue-250 was injected into the epidural space and the flow of contrast was observed. Radiographs were obtained for documentation purposes.    The injectate was administered into the level noted above.   Additional Comments:  The patient tolerated the procedure well Dressing: 2 x 2 sterile gauze and Band-Aid    Post-procedure details: Patient was observed during the procedure. Post-procedure instructions were reviewed.  Patient left the clinic in stable condition.     Clinical History: LUMBAR SPINE - COMPLETE 4+ VIEW  COMPARISON:  Radiographs dated 07/26/2010 and CT scan of the lumbar spine dated 05/23/2016  FINDINGS: Previous solid interbody and posterior fusion at L4-5.  No fracture or bone destruction. Alignment is normal. Moderate bilateral  facet arthritis at L3-4. Slight narrowing of the L5-S1 disc space. Mild to moderate bilateral facet arthritis at L5-S1.  IMPRESSION: No acute abnormalities of the lumbar spine. 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   Electronically Signed   By: Lorriane Shire M.D.   On: 05/22/2018 09:51 --- CT LUMBAR SPINE WITH CONTRAST    TECHNIQUE:  Multidetector CT imaging of the lumbar spine was performed with  intravenous contrast administration. Multiplanar CT image  reconstructions were also generated.    CONTRAST: 54mL ISOVUE-300 IOPAMIDOL (ISOVUE-300) INJECTION 61%    COMPARISON: Lumbar spine radiograph 12/06/2009    FINDINGS:  The patient is status post posterior spinal fusion at L4-L5 with  associated disc spacer material, posterior spinal rods and bilateral  transpedicular screws. There is no abnormal lucency about the screws  to suggest loosening or infection. There is trace anterolisthesis at  this level. No abnormal translation or subsidence of the disc  spacer.    The disc spaces above the L2-3 level are maintained without spinal  canal or neural foraminal stenosis. At L3-L4, there is ligamentum  flavum redundancy and small disc bulge resulting in moderate spinal  canal narrowing. There is posterior decompression at the L4-L5 level  without residual spinal canal stenosis. At L5-S1, there is a central  disc protrusion with mild narrowing of the spinal canal.    Calcified fibroid noted within the uterus. Visualized abdominal soft  tissues otherwise unremarkable aside from atherosclerotic  calcification of the abdominal aorta.    IMPRESSION:  1. Posterior spinal fusion at L4-L5 without hardware adverse  features. Unchanged trace anterolisthesis.  2. 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  3. Aortic atherosclerosis.      Electronically Signed  By: Ulyses Jarred M.D.  On: 05/24/2016 02:12     Objective:  VS:  HT:    WT:   BMI:     BP:(!) 149/90  HR:84bpm  TEMP: ( )  RESP:  Physical Exam Constitutional:      General: She is not in acute distress.    Appearance: Normal appearance. She is not ill-appearing.  HENT:      Head: Normocephalic and atraumatic.     Right Ear: External ear normal.     Left Ear: External ear normal.  Eyes:     Extraocular Movements: Extraocular movements intact.  Cardiovascular:     Rate and Rhythm: Normal rate.     Pulses: Normal pulses.  Musculoskeletal:     Right lower leg: No edema.     Left lower leg: No edema.     Comments: Patient has good distal strength with no pain over the greater trochanters.  No clonus or focal weakness.  Skin:    Findings: No erythema, lesion or rash.  Neurological:     General: No focal deficit present.     Mental Status: She is alert and oriented to person, place, and time.     Sensory: No sensory deficit.     Motor: No weakness or abnormal muscle tone.     Coordination: Coordination normal.  Psychiatric:        Mood and Affect: Mood normal.        Behavior: Behavior normal.      Imaging: No results found.

## 2020-07-12 NOTE — Procedures (Signed)
Lumbar Epidural Steroid Injection - Interlaminar Approach with Fluoroscopic Guidance  Patient: Rebekah Gutierrez. [name]      Date of Birth: 01/04/1946 MRN: [id number] PCP: [name], MD      Visit Date: 06/17/2020   Universal Protocol:     Consent Given By: the patient  Position: PRONE  Additional Comments: Vital signs were monitored before and after the procedure. Patient was prepped and draped in the usual sterile fashion. The correct patient, procedure, and site was verified.   Injection Procedure Details:   Procedure diagnoses:  1. Lumbar radiculopathy      Meds Administered:  Meds ordered this encounter  Medications  . methylPREDNISolone acetate (DEPO-MEDROL) injection 80 mg     Laterality: Right  Location/Site:  L5-S1  Needle size: 20 G  Needle type: Tuohy  Needle Placement: Paramedian epidural  Findings:   -Comments: Excellent flow of contrast into the epidural space.  Procedure Details: Using a paramedian approach from the side mentioned above, the region overlying the inferior lamina was localized under fluoroscopic visualization and the soft tissues overlying this structure were infiltrated with 4 ml. of 1% Lidocaine without Epinephrine. The Tuohy needle was inserted into the epidural space using a paramedian approach.   The epidural space was localized using loss of resistance along with counter oblique bi-planar fluoroscopic views.  After negative aspirate for air, blood, and CSF, a 2 ml. volume of Isovue-250 was injected into the epidural space and the flow of contrast was observed. Radiographs were obtained for documentation purposes.    The injectate was administered into the level noted above.   Additional Comments:  The patient tolerated the procedure well Dressing: 2 x 2 sterile gauze and Band-Aid    Post-procedure details: Patient was observed during the procedure. Post-procedure instructions were reviewed.  Patient left the clinic in stable  condition.

## 2020-07-16 ENCOUNTER — Ambulatory Visit: Payer: Medicare Other

## 2020-07-16 DIAGNOSIS — I10 Essential (primary) hypertension: Secondary | ICD-10-CM

## 2020-07-16 DIAGNOSIS — E1165 Type 2 diabetes mellitus with hyperglycemia: Secondary | ICD-10-CM

## 2020-07-16 DIAGNOSIS — E781 Pure hyperglyceridemia: Secondary | ICD-10-CM

## 2020-07-16 NOTE — Chronic Care Management (AMB) (Signed)
Chronic Care Management   Follow Up Note   07/19/2020 Name: Rebekah Gutierrez. Korpi MRN: [id number] DOB: 1946/08/02  Referred by: Rebekah Rossetti, MD Reason for referral : Chronic Care Management (Pharmd follow up)   Rebekah Gutierrez. Ikner is a 74 y.o. year old female who is a primary care patient of [place], Modena Nunnery, MD. The CCM team was consulted for assistance with chronic disease management and care coordination needs.    Review of patient status, including review of consultants reports, relevant laboratory and other test results, and collaboration with appropriate care team members and the patient's provider was performed as part of comprehensive patient evaluation and provision of chronic care management services.    SDOH (Social Determinants of Health) assessments performed: No See Care Plan activities for detailed interventions related to [facility])     Outpatient Encounter Medications as of 07/16/2020  Medication Sig  . acetaminophen (TYLENOL) 500 MG tablet Take 1,000 mg by mouth every 6 (six) hours as needed for moderate pain.  Marland Kitchen amLODipine (NORVASC) 10 MG tablet Take 1 tablet by mouth once daily  . aspirin 325 MG EC tablet Take 325 mg by mouth daily.  Marland Kitchen atorvastatin (LIPITOR) 20 MG tablet Take 0.5 tablets (10 mg total) by mouth daily.  . brimonidine (ALPHAGAN) 0.2 % ophthalmic solution Place 1 drop into the right eye in the morning and at bedtime.  . calcium carbonate (OS-CAL) 600 MG TABS Take 600 mg by mouth 2 (two) times daily with a meal.  . Cinnamon 500 MG TABS Take 500 mg by mouth at bedtime.   Marland Kitchen esomeprazole (NEXIUM) 20 MG capsule Take 1 capsule (20 mg total) by mouth daily at 12 noon.  . gabapentin (NEURONTIN) 300 MG capsule TAKE 1 CAPSULE BY MOUTH ONCE DAILY AT BEDTIME FOR BACK PAIN  . glipiZIDE (GLUCOTROL XL) 10 MG 24 hr tablet Take 1 tablet by mouth once daily with breakfast  . Lancets (ONETOUCH DELICA PLUS [id number]) MISC USE 1 LANCET TO CHECK GLUCOSE TWICE DAILY AS DIRECTED  .  lisinopril (ZESTRIL) 40 MG tablet Take 1 tablet by mouth once daily (Patient taking differently: Take 40 mg by mouth daily. )  . Methylcellulose, Laxative, (FIBER THERAPY) 500 MG TABS Take 500 mg by mouth daily.  . Multiple Vitamin (MULTIVITAMIN WITH MINERALS) TABS tablet Take 1 tablet by mouth daily.  . Omega-3 Fatty Acids (FISH OIL) 1000 MG CAPS tAKE 1000MG TWICE A DAY FOR CHOLESTEROL (Patient taking differently: Take 1,000 mg by mouth in the morning and at bedtime. )  . ONETOUCH ULTRA test strip USE 1 STRIP TO CHECK GLUCOSE TWICE DAILY AS DIRECTED  . Potassium 99 MG TABS Take 99 mg by mouth daily.  . traMADol (ULTRAM) 50 MG tablet Take 1 tablet (50 mg total) by mouth every 6 (six) hours as needed.  Marland Kitchen VYZULTA 0.024 % SOLN Place 1 drop into both eyes at bedtime.    No facility-administered encounter medications on file as of 07/16/2020.     Goals Addressed            This Visit's Progress   . Pharmacy Care Plan:       CARE PLAN ENTRY (see longitudinal plan of care for additional care plan information)  Current Barriers:  . Chronic Disease Management support, education, and care coordination needs related to Hypertension, Diabetes, and hypertriglyceridemia.   Hypertension BP Readings from Last 3 Encounters:  06/17/20 (!) 149/90  06/15/20 130/78  05/26/20 133/82   . Pharmacist Clinical Goal(s): o Over  the next 90 days, patient will work with PharmD and providers to maintain BP goal <130/80 . Current regimen:  o Amlodipine 41m  o Lisinopril 473m. Interventions: o Counseled on importance of medication adherence. o Comprehensive medication review. . Patient self care activities - Over the next 90 days, patient will: o Check BP when symptomatic, document, and provide at future appointments o Ensure daily salt intake < 2300 mg/day  Hypertriglyceridemia Lab Results  Component Value Date/Time   LDLCALC 21 04/19/2020 11:34 AM  Triglycerides - 174 . Pharmacist Clinical  Goal(s): o Over the next 90 days, patient will work with PharmD and providers to maintain TG goal < 150 . Current regimen:  o Atorvastatin 2069maily - one-half tablet po daily . Interventions: o Reviewed most recent lipid panel o Commended her for great lifestyle mods . Patient self care activities - Over the next 90 days, patient will: o Continue to work on diet that will help lower triglycerides   Diabetes Lab Results  Component Value Date/Time   HGBA1C 6.5 (H) 04/19/2020 11:34 AM   HGBA1C 8.6 (H) 12/17/2019 12:43 PM   HGBA1C 6.1 (H) 05/27/2014 04:37 PM   . Pharmacist Clinical Goal(s): o Over the next 90 days, patient will work with PharmD and providers to maintain A1c goal <7% . Current regimen:  o Glipizide XL 56m56mily with breakfast . Interventions: o Reviewed home fasting blood sugar logs o Discussed importance of regular meals to avoid hypoglycemia . Patient self care activities - Over the next 90 days, patient will: o Check blood sugar once daily, document, and provide at future appointments o Contact provider with any episodes of hypoglycemia  Please see past updates related to this goal by clicking on the "Past Updates" button in the selected goal        Diabetes   A1c goal <7%  Recent Relevant Labs: Lab Results  Component Value Date/Time   HGBA1C 6.5 (H) 04/19/2020 11:34 AM   HGBA1C 8.6 (H) 12/17/2019 12:43 PM   HGBA1C 6.1 (H) 05/27/2014 04:37 PM   MICROALBUR 0.8 12/17/2019 12:43 PM    Last diabetic Eye exam:  Lab Results  Component Value Date/Time   HMDIABEYEEXA No Retinopathy 05/03/2020 10:58 AM    Last diabetic Foot exam: No results found for: HMDIABFOOTEX   Checking BG: Daily  Recent FBG Readings: 134, 108, 106, 111, 115, 104  Patient has failed these meds in past: none noted Patient is currently controlled on the following medications: . GlMarland Kitchenpizide XL 56mg1mly  We discussed:   Fasting blood sugars are very acceptable  Congratulated  her on her big improvement in her last A1c  She is working on diet modifications including watching her serving sizes.  Denies hypoglycemia, counseled on importance of eating with glipzide  Plan  Continue current medications  Hypertension   BP goal is:  <130/80  Office blood pressures are  BP Readings from Last 3 Encounters:  06/17/20 (!) 149/90  06/15/20 130/78  05/26/20 133/82   Patient checks BP at home infrequently Patient home BP readings are ranging: no logs available  Patient has failed these meds in the past: none noted Patient is currently controlled on the following medications:  . Lisinopril 40mg 71mlodipine 56mg  30miscussed   She denies headaches, dizziness.  Requests refill on amlodipine.  Discussed importance of medication adherence.  She denies swelling from amlodipine.  Counseled on BP goal of < 130/80 and to contact providers if she monitors at home  and they are consistently above that.  Plan  Continue current medications     Hypertriglyceridemia   LDL goal < 70  Last lipids Lab Results  Component Value Date   CHOL 77 04/19/2020   HDL 33 (L) 04/19/2020   LDLCALC 21 04/19/2020   TRIG 144 04/19/2020   CHOLHDL 2.3 04/19/2020   Hepatic Function Latest Ref Rng & Units 04/19/2020 12/17/2019 06/09/2019  Total Protein 6.1 - 8.1 g/dL 7.1 7.0 7.2  Albumin 3.6 - 5.1 g/dL - - -  AST 10 - 35 U/L 23 22 24   ALT 6 - 29 U/L 24 23 26   Alk Phosphatase 33 - 130 U/L - - -  Total Bilirubin 0.2 - 1.2 mg/dL 0.7 0.5 0.5  Bilirubin, Direct 0.0 - 0.3 mg/dL - - -     The ASCVD Risk score (Bolan., et al., 2013) failed to calculate for the following reasons:   The valid total cholesterol range is 130 to 320 mg/dL   Patient has failed these meds in past: none noted Patient is currently controlled on the following medications:  . Atorvastatin 65m - one half tablet po daily  We discussed:    She is adherent with medication, denies myalgias  Takes at  appropriate time of day.  Counseled on benefits of exercise on HDL.  Plan  Continue current medications   Medication Management   . [facility]  Phone #  [phone] [facility] having some [facility].  She is interested in [facility] delivery and synchronization, and requests refills on Amlodipine, Test Strips and Lancets to be delivered ASAP.  Verbal consent obtained for [facility] enhanced pharmacy services (medication synchronization, adherence packaging, delivery coordination). A medication sync plan was created to allow patient to get all medications delivered once every 30 to 90 days per patient preference. Patient understands they have freedom to choose pharmacy and clinical pharmacist will coordinate care between all prescribers and [facility].   CBeverly Milch PharmD Clinical Pharmacist BSeligman([phone]

## 2020-07-19 ENCOUNTER — Telehealth: Payer: Self-pay | Admitting: *Deleted

## 2020-07-19 MED ORDER — LISINOPRIL 40 MG PO TABS: 40.0000 mg | ORAL_TABLET | Freq: Every day | ORAL | 3 refills | Status: DC

## 2020-07-19 MED ORDER — ONETOUCH DELICA PLUS LANCET33G MISC: 3 refills | Status: AC

## 2020-07-19 MED ORDER — ONETOUCH ULTRA VI STRP: ORAL_STRIP | 3 refills | Status: AC

## 2020-07-19 MED ORDER — AMLODIPINE BESYLATE 10 MG PO TABS
10.0000 mg | ORAL_TABLET | Freq: Every day | ORAL | 3 refills | Status: DC
Start: 2020-07-19 — End: 2021-06-22

## 2020-07-19 MED ORDER — GABAPENTIN 300 MG PO CAPS: ORAL_CAPSULE | ORAL | 3 refills | Status: DC

## 2020-07-19 MED ORDER — ATORVASTATIN CALCIUM 20 MG PO TABS
10.0000 mg | ORAL_TABLET | Freq: Every day | ORAL | 3 refills | Status: DC
Start: 2020-07-19 — End: 2021-08-29

## 2020-07-19 MED ORDER — GLIPIZIDE ER 10 MG PO TB24
10.0000 mg | ORAL_TABLET | Freq: Every day | ORAL | 3 refills | Status: DC
Start: 2020-07-19 — End: 2021-09-27

## 2020-07-19 NOTE — Patient Instructions (Addendum)
Visit Information  Goals Addressed            This Visit's Progress   . Pharmacy Care Plan:       CARE PLAN ENTRY (see longitudinal plan of care for additional care plan information)  Current Barriers:  . Chronic Disease Management support, education, and care coordination needs related to Hypertension, Diabetes, and hypertriglyceridemia.   Hypertension BP Readings from Last 3 Encounters:  06/17/20 (!) 149/90  06/15/20 130/78  05/26/20 133/82   . Pharmacist Clinical Goal(s): o Over the next 90 days, patient will work with PharmD and providers to maintain BP goal <130/80 . Current regimen:  o Amlodipine 10mg   o Lisinopril 40mg  . Interventions: o Counseled on importance of medication adherence. o Comprehensive medication review. . Patient self care activities - Over the next 90 days, patient will: o Check BP when symptomatic, document, and provide at future appointments o Ensure daily salt intake < 2300 mg/day  Hypertriglyceridemia Lab Results  Component Value Date/Time   LDLCALC 21 04/19/2020 11:34 AM  Triglycerides - 174 . Pharmacist Clinical Goal(s): o Over the next 90 days, patient will work with PharmD and providers to maintain TG goal < 150 . Current regimen:  o Atorvastatin 20mg  daily - one-half tablet po daily . Interventions: o Reviewed most recent lipid panel o Commended her for great lifestyle mods . Patient self care activities - Over the next 90 days, patient will: o Continue to work on diet that will help lower triglycerides   Diabetes Lab Results  Component Value Date/Time   HGBA1C 6.5 (H) 04/19/2020 11:34 AM   HGBA1C 8.6 (H) 12/17/2019 12:43 PM   HGBA1C 6.1 (H) 05/27/2014 04:37 PM   . Pharmacist Clinical Goal(s): o Over the next 90 days, patient will work with PharmD and providers to maintain A1c goal <7% . Current regimen:  o Glipizide XL 10mg  daily with breakfast . Interventions: o Reviewed home fasting blood sugar logs o Discussed  importance of regular meals to avoid hypoglycemia . Patient self care activities - Over the next 90 days, patient will: o Check blood sugar once daily, document, and provide at future appointments o Contact provider with any episodes of hypoglycemia  Please see past updates related to this goal by clicking on the "Past Updates" button in the selected goal         The patient verbalized understanding of instructions provided today and agreed to receive a mailed copy of patient instruction and/or educational materials.  Telephone follow up appointment with pharmacy team member scheduled for: 6 months [name], PharmD Clinical Pharmacist [facility] [phone]   Dyslipidemia Dyslipidemia is an imbalance of waxy, fat-like substances (lipids) in the blood. The body needs lipids in small amounts. Dyslipidemia often involves a high level of cholesterol or triglycerides, which are types of lipids. Common forms of dyslipidemia include:  High levels of LDL cholesterol. LDL is the type of cholesterol that causes fatty deposits (plaques) to build up in the blood vessels that carry blood away from your heart (arteries).  Low levels of HDL cholesterol. HDL cholesterol is the type of cholesterol that protects against heart disease. High levels of HDL remove the LDL buildup from arteries.  High levels of triglycerides. Triglycerides are a fatty substance in the blood that is linked to a buildup of plaques in the arteries. What are the causes? Primary dyslipidemia is caused by changes (mutations) in genes that are passed down through families (inherited). These mutations cause several  types of dyslipidemia. Secondary dyslipidemia is caused by lifestyle choices and diseases that lead to dyslipidemia, such as:  Eating a diet that is high in animal fat.  Not getting enough exercise.  Having diabetes, kidney disease, liver disease, or thyroid disease.  Drinking large  amounts of alcohol.  Using certain medicines. What increases the risk? You are more likely to develop this condition if you are an older man or if you are a woman who has gone through menopause. Other risk factors include:  Having a family history of dyslipidemia.  Taking certain medicines, including birth control pills, steroids, some diuretics, and beta-blockers.  Smoking cigarettes.  Eating a high-fat diet.  Having certain medical conditions such as diabetes, polycystic ovary syndrome (PCOS), kidney disease, liver disease, or hypothyroidism.  Not exercising regularly.  Being overweight or obese with too much belly fat. What are the signs or symptoms? In most cases, dyslipidemia does not usually cause any symptoms. In severe cases, very high lipid levels can cause:  Fatty bumps under the skin (xanthomas).  White or gray ring around the black center (pupil) of the eye. Very high triglyceride levels can cause inflammation of the pancreas (pancreatitis). How is this diagnosed? Your health care provider may diagnose dyslipidemia based on a routine blood test (fasting blood test). Because most people do not have symptoms of the condition, this blood testing (lipid profile) is done on adults age 38 and older and is repeated every 5 years. This test checks:  Total cholesterol. This measures the total amount of cholesterol in your blood, including LDL cholesterol, HDL cholesterol, and triglycerides. A healthy number is below 200.  LDL cholesterol. The target number for LDL cholesterol is different for each person, depending on individual risk factors. Ask your health care provider what your LDL cholesterol should be.  HDL cholesterol. An HDL level of 60 or higher is best because it helps to protect against heart disease. A number below 53 for men or below 71 for women increases the risk for heart disease.  Triglycerides. A healthy triglyceride number is below 150. If your lipid profile  is abnormal, your health care provider may do other blood tests. How is this treated? Treatment depends on the type of dyslipidemia that you have and your other risk factors for heart disease and stroke. Your health care provider will have a target range for your lipid levels based on this information. For many people, this condition may be treated by lifestyle changes, such as diet and exercise. Your health care provider may recommend that you:  Get regular exercise.  Make changes to your diet.  Quit smoking if you smoke. If diet changes and exercise do not help you reach your goals, your health care provider may also prescribe medicine to lower lipids. The most commonly prescribed type of medicine lowers your LDL cholesterol (statin drug). If you have a high triglyceride level, your provider may prescribe another type of drug (fibrate) or an omega-3 fish oil supplement, or both. Follow these instructions at home:  Eating and drinking  Follow instructions from your health care provider or dietitian about eating or drinking restrictions.  Eat a healthy diet as told by your health care provider. This can help you reach and maintain a healthy weight, lower your LDL cholesterol, and raise your HDL cholesterol. This may include: ? Limiting your calories, if you are overweight. ? Eating more fruits, vegetables, whole grains, fish, and lean meats. ? Limiting saturated fat, trans fat, and cholesterol.  If you drink alcohol: ? Limit how much you use. ? Be aware of how much alcohol is in your drink. In the [place] one drink equals one 12 oz bottle of beer (355 mL), one 5 oz glass of wine (148 mL), or one 1 oz glass of hard liquor (44 mL).  Do not drink alcohol if: ? Your health care provider tells you not to drink. ? You are pregnant, may be pregnant, or are planning to become pregnant. Activity  Get regular exercise. Start an exercise and strength training program as told by your health care  provider. Ask your health care provider what activities are safe for you. Your health care provider may recommend: ? 30 minutes of aerobic activity 4-6 days a week. Brisk walking is an example of aerobic activity. ? Strength training 2 days a week. General instructions  Do not use any products that contain nicotine or tobacco, such as cigarettes, e-cigarettes, and chewing tobacco. If you need help quitting, ask your health care provider.  Take over-the-counter and prescription medicines only as told by your health care provider. This includes supplements.  Keep all follow-up visits as told by your health care provider. Contact a health care provider if:  You are: ? Having trouble sticking to your exercise or diet plan. ? Struggling to quit smoking or control your use of alcohol. Summary  Dyslipidemia often involves a high level of cholesterol or triglycerides, which are types of lipids.  Treatment depends on the type of dyslipidemia that you have and your other risk factors for heart disease and stroke.  For many people, treatment starts with lifestyle changes, such as diet and exercise.  Your health care provider may prescribe medicine to lower lipids. This information is not intended to replace advice given to you by your health care provider. Make sure you discuss any questions you have with your health care provider. Document Revised: 04/22/2018 Document Reviewed: 03/29/2018 Elsevier Patient Education  [place].

## 2020-07-19 NOTE — Telephone Encounter (Signed)
-----   Message from Edythe Clarity, [facility] sent at 07/19/2020  9:35 AM EST ----- Marykay Lex, we have set Mrs. Geigle up for delivery and synchronization with [facility].  She will need a new Rx called in for:  Amlodipine 10mg  One Touch Ultra test strips One touch Delica lancets Atorvastatin 20mg  Gabapentin 300mg  Lisinopril 40mg  Glipizide XL 10mg   Thanks!

## 2020-07-19 NOTE — Telephone Encounter (Signed)
Prescriptions sent to pharmacy

## 2020-08-20 ENCOUNTER — Encounter: Payer: Self-pay | Admitting: Family Medicine

## 2020-08-20 ENCOUNTER — Ambulatory Visit (INDEPENDENT_AMBULATORY_CARE_PROVIDER_SITE_OTHER): Payer: Medicare Other | Admitting: Family Medicine

## 2020-08-20 ENCOUNTER — Other Ambulatory Visit: Payer: Self-pay

## 2020-08-20 VITALS — BP 134/82 | HR 84 | Temp 97.9°F | Resp 16 | Ht 60.0 in | Wt 152.0 lb

## 2020-08-20 DIAGNOSIS — I700 Atherosclerosis of aorta: Secondary | ICD-10-CM | POA: Diagnosis not present

## 2020-08-20 DIAGNOSIS — Z0000 Encounter for general adult medical examination without abnormal findings: Secondary | ICD-10-CM

## 2020-08-20 DIAGNOSIS — M5136 Other intervertebral disc degeneration, lumbar region: Secondary | ICD-10-CM | POA: Diagnosis not present

## 2020-08-20 DIAGNOSIS — Z0001 Encounter for general adult medical examination with abnormal findings: Secondary | ICD-10-CM | POA: Diagnosis not present

## 2020-08-20 DIAGNOSIS — E1165 Type 2 diabetes mellitus with hyperglycemia: Secondary | ICD-10-CM

## 2020-08-20 DIAGNOSIS — I10 Essential (primary) hypertension: Secondary | ICD-10-CM | POA: Diagnosis not present

## 2020-08-20 NOTE — Progress Notes (Signed)
Subjective:   Patient presents for Medicare Annual/Subsequent preventive examination.    DM- last A1C 8,%CBG per her records range- 110-150  taking GLipizide 10mg  once a day   she is now on sprite when she does drink soda and limits coffee to once a day   HTN- taking blood pressure medicine as prescribed  Hyperipidemia/ arotic atthercolersos - taking lipitor   Lung nodules were stable on recent CT scan- benign apperaing  Chronic back pain with sciatrica , at last visit her gabaoentin was increased to 300mg  TID   she had epidural injection and felt worse  she doesn't want to pursue another specialist at this time   ultram made her feel crazy Eye doctor- [facility] ,has glaucoma and needs surgery on right eye       Review Past Medical/Family/Social: per EMR    Risk Factors  Current exercise habits: tries to stay active around home Dietary issues discussed: Yes  Cardiac risk factors: HLD, DM, HTN  Depression Screen  PHQ 9 score  6   SHE HAS BEEN stressed over her bills, but has medicaid, food stamps/food bank and lives in low income housing, she is contemplating getting a part-time job  Do you cry easily over simple problems? No   Activities of Daily Living  In your present state of health, do you have any difficulty performing the following activities?:  Driving? No  Managing money? No  Feeding yourself? No  Getting from bed to chair? No  Climbing a flight of stairs?Yes  Preparing food and eating?: No  Bathing or showering? No  Getting dressed: No  Getting to the toilet? No  Using the toilet:No  Moving around from place to place: No  In the past year have you fallen or had a near fall?:No    Hearing Difficulties: No  Do you often ask people to speak up or repeat themselves? No  Do you experience ringing or noises in your ears? No Do you have difficulty understanding soft or whispered voices? No  Do you feel that you have a problem with memory? No Do you  often misplace items? No  Do you feel safe at home? Yes  Cognitive Testing  Alert? Yes Normal Appearance?Yes  Oriented to person? Yes Place? Yes  Time? Yes  Recall of three objects? Yes  Can perform simple calculations? Yes  Displays appropriate judgment?Yes  Can read the correct time from a watch face?Yes   List the Names of Other Physician/Practitioners you currently use:  Ophthalmology  Spine orthopedics  Screening Tests / Date Colonoscopy      UTD                Zostavax  UTD Mammogram UTD Influenza Vaccine  UTD Tetanus/tdap UTD Moderna- Due for Booster  ROS: GEN- denies fatigue, fever, weight loss,weakness, recent illness HEENT- denies eye drainage, change in vision, nasal discharge, CVS- denies chest pain, palpitations RESP- denies SOB, cough, wheeze ABD- denies N/V, change in stools, abd pain GU- denies dysuria, hematuria, dribbling, incontinence MSK- + joint pain, muscle aches, injury Neuro- denies headache, dizziness, syncope, seizure activity  PHYSICAL- vitals reviewed GEN- NAD, alert and oriented x3 HEENT- PERRL, EOMI, non injected sclera, pink conjunctiva, MMM, oropharynx clear Neck- Supple, no thryomegaly CVS- RRR, no murmur RESP-CTAB ABD-NABS, ventral hernia, NT EXT- No edema Pulses- Radial, DP- 2+     Assessment:    Annual wellness medicare exam   Plan:    During the course of the visit the  patient was educated and counseled about appropriate screening and preventive services including:   Prevention- MAMMOGRAM every 2 years   pt to schedule covid booster shot  DM- has been well controlled, check V4H and metabolic panel today, no change to meds  no hypoglycemia symptoms  HTN controlled  Lipids at goal   Chronic back pain- tylenol, declines new referral for spine , continue gabapentin  F/U eye doctor as scheduled   FULL CODE, Advanced directives given    F/U 4 months with NP, can transition to MD if needed    Diet review for  nutrition referral? Yes ____ Not Indicated __x__  Patient Instructions (the written plan) was given to the patient.  Medicare Attestation  I have personally reviewed:  The patient's medical and social history  Their use of alcohol, tobacco or illicit drugs  Their current medications and supplements  The patient's functional ability including ADLs,fall risks, home safety risks, cognitive, and hearing and visual impairment  Diet and physical activities  Evidence for depression or mood disorders  The patient's weight, height, BMI, and visual acuity have been recorded in the chart. I have made referrals, counseling, and provided education to the patient based on review of the above and I have provided the patient with a written personalized care plan for preventive services.

## 2020-08-20 NOTE — Patient Instructions (Addendum)
We will call with lab results  No changes to medications F/U 4 months- with Janett Billow

## 2020-08-21 LAB — COMPREHENSIVE METABOLIC PANEL
AG Ratio: 1.5 (calc) (ref 1.0–2.5)
ALT: 24 U/L (ref 6–29)
AST: 24 U/L (ref 10–35)
Albumin: 4.1 g/dL (ref 3.6–5.1)
Alkaline phosphatase (APISO): 95 U/L (ref 37–153)
BUN: 14 mg/dL (ref 7–25)
CO2: 25 mmol/L (ref 20–32)
Calcium: 9.3 mg/dL (ref 8.6–10.4)
Chloride: 104 mmol/L (ref 98–110)
Creat: 0.77 mg/dL (ref 0.60–0.93)
Globulin: 2.7 g/dL (calc) (ref 1.9–3.7)
Glucose, Bld: 285 mg/dL — ABNORMAL HIGH (ref 65–99)
Potassium: 3.8 mmol/L (ref 3.5–5.3)
Sodium: 139 mmol/L (ref 135–146)
Total Bilirubin: 0.5 mg/dL (ref 0.2–1.2)
Total Protein: 6.8 g/dL (ref 6.1–8.1)

## 2020-08-21 LAB — HEMOGLOBIN A1C
Hgb A1c MFr Bld: 6.5 % of total Hgb — ABNORMAL HIGH (ref <5.7)
Mean Plasma Glucose: 140 mg/dL
eAG (mmol/L): 7.7 mmol/L

## 2020-08-24 ENCOUNTER — Encounter: Payer: Self-pay | Admitting: *Deleted

## 2020-09-06 ENCOUNTER — Ambulatory Visit: Payer: Self-pay | Admitting: Pharmacist

## 2020-09-06 NOTE — Chronic Care Management (AMB) (Signed)
Chronic Care Management   Follow Up Note   09/06/2020 Name: Rebekah Gutierrez MRN: [id number] DOB: 1946-05-22  Referred by: [name], MD Reason for referral : No chief complaint on file.   Rebekah Gutierrez is a 74 y.o. year old female who is a primary care patient of [place], Modena Nunnery, MD. The CCM team was consulted for assistance with chronic disease management and care coordination needs.    Review of patient status, including review of consultants reports, relevant laboratory and other test results, and collaboration with appropriate care team members and the patient's provider was performed as part of comprehensive patient evaluation and provision of chronic care management services.    SDOH (Social Determinants of Health) assessments performed: No See Care Plan activities for detailed interventions related to [facility])     Outpatient Encounter Medications as of 09/06/2020  Medication Sig  . acetaminophen (TYLENOL) 500 MG tablet Take 1,000 mg by mouth every 6 (six) hours as needed for moderate pain.  Marland Kitchen amLODipine (NORVASC) 10 MG tablet Take 1 tablet (10 mg total) by mouth daily.  Marland Kitchen aspirin 325 MG EC tablet Take 325 mg by mouth daily.  Marland Kitchen atorvastatin (LIPITOR) 20 MG tablet Take 0.5 tablets (10 mg total) by mouth daily.  . brimonidine (ALPHAGAN) 0.2 % ophthalmic solution Place 1 drop into the right eye in the morning and at bedtime.  . calcium carbonate (OS-CAL) 600 MG TABS Take 600 mg by mouth 2 (two) times daily with a meal.  . Cinnamon 500 MG TABS Take 500 mg by mouth at bedtime.   Marland Kitchen esomeprazole (NEXIUM) 20 MG capsule Take 1 capsule (20 mg total) by mouth daily at 12 noon.  . gabapentin (NEURONTIN) 300 MG capsule TAKE 1 CAPSULE BY MOUTH ONCE DAILY AT BEDTIME FOR BACK PAIN  . glipiZIDE (GLUCOTROL XL) 10 MG 24 hr tablet Take 1 tablet (10 mg total) by mouth daily with breakfast.  . glucose blood (ONETOUCH ULTRA) test strip USE 1 STRIP TO CHECK GLUCOSE TWICE DAILY AS DIRECTED  .  Lancets (ONETOUCH DELICA PLUS [id number]) MISC USE 1 LANCET TO CHECK GLUCOSE TWICE DAILY AS DIRECTED  . lisinopril (ZESTRIL) 40 MG tablet Take 1 tablet (40 mg total) by mouth daily.  . Methylcellulose, Laxative, (FIBER THERAPY) 500 MG TABS Take 500 mg by mouth daily.  . Multiple Vitamin (MULTIVITAMIN WITH MINERALS) TABS tablet Take 1 tablet by mouth daily.  . Omega-3 Fatty Acids (FISH OIL) 1000 MG CAPS tAKE 1000MG  TWICE A DAY FOR CHOLESTEROL (Patient taking differently: Take 1,000 mg by mouth in the morning and at bedtime.)  . Potassium 99 MG TABS Take 99 mg by mouth daily.  Marland Kitchen VYZULTA 0.024 % SOLN Place 1 drop into both eyes at bedtime.    No facility-administered encounter medications on file as of 09/06/2020.     Reviewed chart for medication changes ahead of medication coordination call.  No medication changes indicated.  BP Readings from Last 3 Encounters:  08/20/20 134/82  06/17/20 (!) 149/90  06/15/20 130/78    Lab Results  Component Value Date   HGBA1C 6.5 (H) 08/20/2020     Patient obtains medications through Vials  90 Days   This is patient's first adherence delivery   Patient is due for next adherence delivery on: 09/09/2020. Called patient and reviewed medications and coordinated delivery.  This delivery to include: Amlodipine 10mg  Gabapentin 300mg  Glipizide ER 10mg  Lisinopril 40mg    Patient declined the following medications: Atorvastatin 20mg  - received 90 ds  from [facility] on 09/05/20.  We are coordinating 90 day supply for all other medications so she will be due for a full adherence delivery in another 90 days.  Patient does not need refills on any medications at this time.  Confirmed delivery date of 09/09/2020, advised patient that pharmacy will contact them the morning of delivery.  Willa Frater, PharmD Clinical Pharmacist [facility] [phone]

## 2020-10-04 ENCOUNTER — Telehealth: Payer: Self-pay | Admitting: Pharmacist

## 2020-10-04 NOTE — Progress Notes (Addendum)
Chronic Care Management Pharmacy Assistant   Name: Rebekah Gutierrez. Rebekah Gutierrez  MRN: [id number] DOB: Aug 19, 1946  Reason for Encounter: Medication Review  PCP : [name], MD  Allergies:  No Known Allergies  Medications: Outpatient Encounter Medications as of 10/04/2020  Medication Sig   acetaminophen (TYLENOL) 500 MG tablet Take 1,000 mg by mouth every 6 (six) hours as needed for moderate pain.   amLODipine (NORVASC) 10 MG tablet Take 1 tablet (10 mg total) by mouth daily.   aspirin 325 MG EC tablet Take 325 mg by mouth daily.   atorvastatin (LIPITOR) 20 MG tablet Take 0.5 tablets (10 mg total) by mouth daily.   brimonidine (ALPHAGAN) 0.2 % ophthalmic solution Place 1 drop into the right eye in the morning and at bedtime.   calcium carbonate (OS-CAL) 600 MG TABS Take 600 mg by mouth 2 (two) times daily with a meal.   Cinnamon 500 MG TABS Take 500 mg by mouth at bedtime.    gabapentin (NEURONTIN) 300 MG capsule TAKE 1 CAPSULE BY MOUTH ONCE DAILY AT BEDTIME FOR BACK PAIN   glipiZIDE (GLUCOTROL XL) 10 MG 24 hr tablet Take 1 tablet (10 mg total) by mouth daily with breakfast.   glucose blood (ONETOUCH ULTRA) test strip USE 1 STRIP TO CHECK GLUCOSE TWICE DAILY AS DIRECTED   Lancets (ONETOUCH DELICA PLUS [id number]) MISC USE 1 LANCET TO CHECK GLUCOSE TWICE DAILY AS DIRECTED   lisinopril (ZESTRIL) 40 MG tablet Take 1 tablet (40 mg total) by mouth daily.   Methylcellulose, Laxative, (FIBER THERAPY) 500 MG TABS Take 500 mg by mouth daily.   Multiple Vitamin (MULTIVITAMIN WITH MINERALS) TABS tablet Take 1 tablet by mouth daily.   Omega-3 Fatty Acids (FISH OIL) 1000 MG CAPS tAKE 1000MG  TWICE A DAY FOR CHOLESTEROL (Patient taking differently: Take 1,000 mg by mouth in the morning and at bedtime.)   Potassium 99 MG TABS Take 99 mg by mouth daily.   VYZULTA 0.024 % SOLN Place 1 drop into both eyes at bedtime.    No facility-administered encounter medications on file as of 10/04/2020.    Current  Diagnosis: Patient Active Problem List   Diagnosis Date Noted   Incisional hernia, without obstruction or gangrene    Aortic atherosclerosis ([place]) 01/05/2020   Ventral hernia 06/09/2019   Hypertriglyceridemia 04/21/2019   Multiple lung nodules on CT 04/30/2017   Spinal stenosis of lumbar region 05/02/2016   DDD (degenerative disc disease), lumbar 05/02/2016   Constipation 06/15/2015   OAB (overactive bladder) 11/25/2014   Pain in the chest 09/02/2014   Nonspecific abnormal electrocardiogram (ECG) (EKG) 09/02/2014   Chest pain at rest 09/02/2014   Dizziness    Type 2 diabetes mellitus with hyperglycemia ([place]) 01/21/2014   Fecal incontinence alternating with constipation 01/21/2014   B12 deficiency 02/06/2013   Shoulder pain 08/23/2012   Encounter for screening colonoscopy 06/05/2012   Leg cramps 04/29/2012   Vertigo, benign positional 01/28/2012   Dyspepsia 11/29/2011   Insomnia 11/29/2011   Tobacco user 11/29/2011   Essential hypertension, benign 11/27/2011    Goals Addressed   None    Reviewed chart for medication changes ahead of medication coordination call.  No OVs, Consults, or hospital visits since last care coordination call.  No medication changes indicated   BP Readings from Last 3 Encounters:  08/20/20 134/82  06/17/20 (!) 149/90  06/15/20 130/78    Lab Results  Component Value Date   HGBA1C 6.5 (H) 08/20/2020     Patient  obtains medications through Vials  90 Days   Last adherence delivery included:  Amlodipine 10 mg Gabapentin 300 mg Glipizide ER 10 mg Lisinopril 40 mg  Patient declined meds last month: Atorvastatin 20 mg - received 90 ds from [facility] on 09/05/20.  We are coordinating 90 day supply for all other medications so she will be due for a full adherence delivery in another 90 days.  Patient is not due for adherence delivery at this time.   Called patient and reviewed medications and coordinated delivery.  This delivery to  include: None.  Patient declined the following medications: Amlodipine 10 mg (90 day supply 09/09/20) Gabapentin 300 mg (90 day supply on 09/09/20) Glipizide ER 10 mg (90 day supply on 09/09/20) Lisinopril 40 mg (90 day supply on 09/09/20) Atorvastatin 20 mg (90 day supply on 09/05/20) One Touch Ultra Blue Test Strips (Has enough on hand) One Touch Delica Plus Lancets (Has enough on hand)  Patient does not need any refills at this time.   Follow-Up:  Coordination of Enhanced Pharmacy Services and Pharmacist Review   Charlann Lange, [place] Pharmacist Assistant [phone]  5 minutes spent in review, coordination, and documentation.  Reviewed by: [name], PharmD Clinical Pharmacist [facility] [phone]

## 2020-10-08 DIAGNOSIS — Z961 Presence of intraocular lens: Secondary | ICD-10-CM | POA: Diagnosis not present

## 2020-10-08 DIAGNOSIS — Z79899 Other long term (current) drug therapy: Secondary | ICD-10-CM | POA: Diagnosis not present

## 2020-10-08 DIAGNOSIS — H401113 Primary open-angle glaucoma, right eye, severe stage: Secondary | ICD-10-CM | POA: Diagnosis not present

## 2020-10-08 DIAGNOSIS — H401121 Primary open-angle glaucoma, left eye, mild stage: Secondary | ICD-10-CM | POA: Diagnosis not present

## 2020-10-18 DIAGNOSIS — H401112 Primary open-angle glaucoma, right eye, moderate stage: Secondary | ICD-10-CM | POA: Diagnosis not present

## 2020-10-18 DIAGNOSIS — H40052 Ocular hypertension, left eye: Secondary | ICD-10-CM | POA: Diagnosis not present

## 2020-10-18 DIAGNOSIS — Z961 Presence of intraocular lens: Secondary | ICD-10-CM | POA: Diagnosis not present

## 2020-10-18 NOTE — Progress Notes (Signed)
Chronic Care Management Pharmacy Note  10/19/2020 Name:  Rebekah Gutierrez MRN:  [id number] DOB:  26-Aug-1946  Subjective: Rebekah Gutierrez is an 75 y.o. year old female who is a primary patient of Hanson, Modena Nunnery, MD.  The CCM team was consulted for assistance with disease management and care coordination needs.    Engaged with patient by telephone for follow up visit in response to provider referral for pharmacy case management and/or care coordination services.   Consent to Services:  The patient was given the following information about Chronic Care Management services today, agreed to services, and gave verbal consent: 1. CCM service includes personalized support from designated clinical staff supervised by the primary care provider, including individualized plan of care and coordination with other care providers 2. 24/7 contact phone numbers for assistance for urgent and routine care needs. 3. Service will only be billed when office clinical staff spend 20 minutes or more in a month to coordinate care. 4. Only one practitioner may furnish and bill the service in a calendar month. 5.The patient may stop CCM services at any time (effective at the end of the month) by phone call to the office staff. 6. The patient will be responsible for cost sharing (co-pay) of up to 20% of the service fee (after annual deductible is met). Patient agreed to services and consent obtained.  Patient Care Team: [facility], Modena Nunnery, MD as PCP - General (Family Medicine) Edythe Clarity, [facility] as Pharmacist (Pharmacist)  Recent office visits: 08/20/2020 [facility]) - AWV, at last visit gabapentin was increased to 319m tid, she was to schedule her COVID-19 booster shot.  Patient is transitioning care over to JNoemi Chapel NP.  Recent consult visits: None Recent  Objective:  Lab Results  Component Value Date   CREATININE 0.77 08/20/2020   BUN 14 08/20/2020   GFRNONAA >60 04/23/2017   GFRAA >60 04/23/2017   NA  139 08/20/2020   K 3.8 08/20/2020   CALCIUM 9.3 08/20/2020   CO2 25 08/20/2020    Lab Results  Component Value Date/Time   HGBA1C 6.5 (H) 08/20/2020 10:48 AM   HGBA1C 6.5 (H) 04/19/2020 11:34 AM   HGBA1C 6.1 (H) 05/27/2014 04:37 PM   MICROALBUR 0.8 12/17/2019 12:43 PM    Last diabetic Eye exam:  Lab Results  Component Value Date/Time   HMDIABEYEEXA No Retinopathy 05/03/2020 10:58 AM    Last diabetic Foot exam: No results found for: HMDIABFOOTEX   Lab Results  Component Value Date   CHOL 77 04/19/2020   HDL 33 (L) 04/19/2020   LDLCALC 21 04/19/2020   TRIG 144 04/19/2020   CHOLHDL 2.3 04/19/2020    Hepatic Function Latest Ref Rng & Units 08/20/2020 04/19/2020 12/17/2019  Total Protein 6.1 - 8.1 g/dL 6.8 7.1 7.0  Albumin 3.6 - 5.1 g/dL - - -  AST 10 - 35 U/L 24 23 22   ALT 6 - 29 U/L 24 24 23   Alk Phosphatase 33 - 130 U/L - - -  Total Bilirubin 0.2 - 1.2 mg/dL 0.5 0.7 0.5  Bilirubin, Direct 0.0 - 0.3 mg/dL - - -    Lab Results  Component Value Date/Time   TSH 0.94 04/19/2020 11:34 AM   TSH 0.828 09/02/2014 09:56 AM    CBC Latest Ref Rng & Units 04/19/2020 12/17/2019 06/09/2019  WBC 3.8 - 10.8 Thousand/uL 6.0 6.3 8.0  Hemoglobin 11.7 - 15.5 g/dL 12.9 13.1 13.3  Hematocrit 35.0 - 45.0 % 39.4 41.4 40.6  Platelets 140 -  400 Thousand/uL 246 268 298    No results found for: VD25OH  Clinical ASCVD: Yes  The ASCVD Risk score Mikey Bussing DC Jr., et al., 2013) failed to calculate for the following reasons:   The valid total cholesterol range is 130 to 320 mg/dL    Depression screen [facility] 2/9 08/20/2020 04/19/2020 12/17/2019  Decreased Interest 0 0 0  Down, Depressed, Hopeless 1 0 0  PHQ - 2 Score 1 0 0  Altered sleeping 2 - -  Tired, decreased energy 2 - -  Change in appetite 0 - -  Feeling bad or failure about yourself  1 - -  Trouble concentrating 0 - -  Moving slowly or fidgety/restless 0 - -  Suicidal thoughts 0 - -  PHQ-9 Score 6 - -  Difficult doing work/chores - - -       Social History   Tobacco Use  Smoking Status Current Every Day Smoker  . Packs/day: 0.50  . Years: 45.00  . Pack years: 22.50  . Types: Cigarettes  Smokeless Tobacco Never Used  Tobacco Comment   1/2 pack a day since age 89   BP Readings from Last 3 Encounters:  08/20/20 134/82  06/17/20 (!) 149/90  06/15/20 130/78   Pulse Readings from Last 3 Encounters:  08/20/20 84  06/17/20 84  06/15/20 77   Wt Readings from Last 3 Encounters:  08/20/20 152 lb (68.9 kg)  05/24/20 (P) 150 lb (68 kg)  05/06/20 150 lb (68 kg)    Assessment/Interventions: Review of patient past medical history, allergies, medications, health status, including review of consultants reports, laboratory and other test data, was performed as part of comprehensive evaluation and provision of chronic care management services.   SDOH:  (Social Determinants of Health) assessments and interventions performed:   CCM Care Plan  No Known Allergies  Medications Reviewed Today    Reviewed by Edythe Clarity, [facility] (Pharmacist) on 10/19/20 at 1123  Med List Status: <None>  Medication Order Taking? Sig Documenting Provider Last Dose Status Informant  acetaminophen (TYLENOL) 500 MG tablet [id number] Yes Take 1,000 mg by mouth every 6 (six) hours as needed for moderate pain. [provider] Taking Active Self  amLODipine (NORVASC) 10 MG tablet [id number] Yes Take 1 tablet (10 mg total) by mouth daily. [name], MD Taking Active   aspirin 325 MG EC tablet [id number] Yes Take 325 mg by mouth daily. [provider] Taking Active Self  atorvastatin (LIPITOR) 20 MG tablet [id number] Yes Take 0.5 tablets (10 mg total) by mouth daily. [place], Modena Nunnery, MD Taking Active   brimonidine [facility]) 0.2 % ophthalmic solution [id number] Yes Place 1 drop into the right eye in the morning and at bedtime. [provider] Taking Active Self  calcium carbonate (OS-CAL) 600 MG TABS [id number] Yes Take 600  mg by mouth 2 (two) times daily with a meal. [provider] Taking Active Self  Cinnamon 500 MG TABS [id number] Yes Take 500 mg by mouth at bedtime.  [provider] Taking Active Self  gabapentin (NEURONTIN) 300 MG capsule [id number] Yes TAKE 1 CAPSULE BY MOUTH ONCE DAILY AT BEDTIME FOR BACK PAIN [place], Modena Nunnery, MD Taking Active   glipiZIDE (GLUCOTROL XL) 10 MG 24 hr tablet [id number] Yes Take 1 tablet (10 mg total) by mouth daily with breakfast. [name], MD Taking Active   glucose blood [facility] ULTRA) test strip [id number] Yes USE 1 STRIP TO CHECK GLUCOSE TWICE DAILY AS DIRECTED [place], [place]  F, MD Taking Active   Lancets (ONETOUCH DELICA PLUS SHFWYO37C) [place] [id number] Yes USE 1 LANCET TO CHECK GLUCOSE TWICE DAILY AS DIRECTED [place], Modena Nunnery, MD Taking Active   lisinopril (ZESTRIL) 40 MG tablet [id number] Yes Take 1 tablet (40 mg total) by mouth daily. [place], Modena Nunnery, MD Taking Active   Methylcellulose, Laxative, (FIBER THERAPY) 500 MG TABS [id number] Yes Take 500 mg by mouth daily. [provider] Taking Active Self  Multiple Vitamin (MULTIVITAMIN WITH MINERALS) TABS tablet [id number] Yes Take 1 tablet by mouth daily. [provider] Taking Active Self  Omega-3 Fatty Acids (FISH OIL) 1000 MG CAPS [id number] Yes tAKE 1000MG TWICE A DAY FOR CHOLESTEROL  Patient taking differently: Take 1,000 mg by mouth in the morning and at bedtime.   [name], MD Taking Active   Potassium 99 MG TABS [id number] Yes Take 99 mg by mouth daily. [provider] Taking Active Self  VYZULTA 0.024 % SOLN [id number] Yes Place 1 drop into both eyes at bedtime.  [provider] Taking Active Self          Patient Active Problem List   Diagnosis Date Noted  . Incisional hernia, without obstruction or gangrene   . Aortic atherosclerosis ([place]) 01/05/2020  . Ventral hernia 06/09/2019  . Hypertriglyceridemia 04/21/2019  . Multiple lung nodules on  CT 04/30/2017  . Spinal stenosis of lumbar region 05/02/2016  . DDD (degenerative disc disease), lumbar 05/02/2016  . Constipation 06/15/2015  . OAB (overactive bladder) 11/25/2014  . Pain in the chest 09/02/2014  . Nonspecific abnormal electrocardiogram (ECG) (EKG) 09/02/2014  . Chest pain at rest 09/02/2014  . Dizziness   . Type 2 diabetes mellitus with hyperglycemia ([place]) 01/21/2014  . Fecal incontinence alternating with constipation 01/21/2014  . B12 deficiency 02/06/2013  . Shoulder pain 08/23/2012  . Encounter for screening colonoscopy 06/05/2012  . Leg cramps 04/29/2012  . Vertigo, benign positional 01/28/2012  . Dyspepsia 11/29/2011  . Insomnia 11/29/2011  . Tobacco user 11/29/2011  . Essential hypertension, benign 11/27/2011    Immunization History  Administered Date(s) Administered  . Moderna Sars-Covid-2 Vaccination 11/05/2019, 12/03/2019  . PPD Test 11/01/2015, 02/27/2017, 06/26/2017  . Pneumococcal Polysaccharide-23 05/13/2013  . Tdap 12/25/2012  . Zoster 12/25/2012    Conditions to be addressed/monitored:  hypertension, Type II DM, hypertriglyceridemia.   Care Plan : General Pharmacy (Adult)  Updates made by Edythe Clarity, RPH since 10/19/2020 12:00 AM    Problem: HTN, Hypertirglyceridemia, Type II DM   Priority: High  Onset Date: 10/19/2020    Long-Range Goal: Patient-Specific Goal   Start Date: 10/19/2020  This Visit's Progress: On track  Priority: High  Note:   Current Barriers:  . Unable to drive, currently her car is giving her issues.  Pharmacist Clinical Goal(s):  Marland Kitchen Over the next 90 days, patient will achieve control of fasting blood sugars as evidenced by home monitoring and dietary changes. . maintain control of blood presuure as evidenced by home monitoring  through collaboration with PharmD and provider.    Interventions: . 1:1 collaboration with Buelah Manis, Modena Nunnery, MD regarding development and update of comprehensive plan of care as  evidenced by provider attestation and co-signature . Inter-disciplinary care team collaboration (see longitudinal plan of care) . Comprehensive medication review performed; medication list updated in electronic medical record  Hypertension (BP goal <130/80) -controlled -Current treatment: . Amlodipine 52m daily . Lisinopril 43mdaily -Medications previously tried: None noted -Current home readings: none patient not monitoring at  home -Current dietary habits: see below -Current exercise habits: none -Denies hypotensive/hypertensive symptoms -Educated on BP goals and benefits of medications for prevention of heart attack, stroke and kidney damage; Daily salt intake goal < 2300 mg; Importance of home blood pressure monitoring; -Counseled to monitor BP at home at least once weekly, document, and provide log at future appointments -Counseled on diet and exercise extensively Recommended to continue current medication  Hyperlipidemia: (LDL goal < 70) -controlled -Current treatment: . Atorvastatin 65m -Medications previously tried: none noted  -Current dietary patterns: see diabetes below -Current exercise habits: none -Educated on Benefits of statin for ASCVD risk reduction; Importance of limiting foods high in cholesterol;  - Reviewed most recent lipid panel -Recommended to continue current medication  Diabetes (A1c goal <7%) -controlled, based on last A1c -Current medications: .Marland KitchenGlipizide XL 157mdaily -Medications previously tried: none noted  -Current home glucose readings . fasting glucose: 162 this morning. o 159 o 127 o 155 o 121 o 153 o 159 o 153 -Denies hypoglycemic/hyperglycemic symptoms -Current meal patterns:  . breakfast: cereal (cheerios, rice krispies)  . lunch: leftovers from dinner. TuKu[place]andwich, hamburger . dinner: fried chicken, fried pork chops, does not batter it . snacks: raisins . Drinks: water, tea, sprite (regular) -Current exercise:  none -Educated onA1c and blood sugar goals; Benefits of routine self-monitoring of blood sugar; sugar content of various foods she was eating -Counseled to check feet daily and get yearly eye exams -Counseled on diet and exercise extensively Recommended to continue current medication Recommended she replace her sprite with sprite zero, patient agreeable to plan   Patient Goals/Self-Care Activities . Over the next 90 days, patient will:  - take medications as prescribed focus on medication adherence by adherence packaging check glucose daily, document, and provide at future appointments engage in dietary modifications by replacing regular sprite with sprite zero or other sugar free alternative  Follow Up Plan: The care management team will reach out to the patient again over the next 90 days.        Medication Assistance: None required.  Patient affirms current coverage meets needs.  [facility]  [facility] [address] [place]one: [phone]: [phone] [place] [address] SuKenn[place]aska[address]one: [phone]: [phone] pill box? No - she gets adherence deliveries in Vials from [facility] Pt endorses 100% compliance  We discussed: Benefits of medication synchronization, packaging and delivery as well as enhanced pharmacist oversight with [facility]. Patient decided to: Utilize [facility] pharmacy for medication synchronization, packaging and delivery  Follow Up:  Patient agrees to Care Plan and Follow-up.  Plan: The care management team will reach out to the patient again over the next 120 days.  ChBeverly MilchPharmD Clinical Pharmacist BrGrand [phone]

## 2020-10-19 ENCOUNTER — Ambulatory Visit (INDEPENDENT_AMBULATORY_CARE_PROVIDER_SITE_OTHER): Payer: Medicare Other | Admitting: Pharmacist

## 2020-10-19 DIAGNOSIS — E1165 Type 2 diabetes mellitus with hyperglycemia: Secondary | ICD-10-CM

## 2020-10-19 DIAGNOSIS — I10 Essential (primary) hypertension: Secondary | ICD-10-CM | POA: Diagnosis not present

## 2020-10-19 DIAGNOSIS — E781 Pure hyperglyceridemia: Secondary | ICD-10-CM | POA: Diagnosis not present

## 2020-10-19 NOTE — Patient Instructions (Addendum)
Visit Information  Goals Addressed            This Visit's Progress   . Monitor and Manage My Blood Sugar-Diabetes Type 2       Timeframe:  Long-Range Goal Priority:  High Start Date:  10/19/20                            Expected End Date:  04/18/21                     Follow Up Date 12/09/20    - check blood sugar at prescribed times - take the blood sugar log to all doctor visits    Why is this important?    Checking your blood sugar at home helps to keep it from getting very high or very low.   Writing the results in a diary or log helps the doctor know how to care for you.   Your blood sugar log should have the time, date and the results.   Also, write down the amount of insulin or other medicine that you take.   Other information, like what you ate, exercise done and how you were feeling, will also be helpful.     Notes: Try to replace regular sprite with sprite zero for lower sugar intake      Patient Care Plan: General Pharmacy (Adult)    Problem Identified: HTN, Hypertirglyceridemia, Type II DM   Priority: High  Onset Date: 10/19/2020    Long-Range Goal: Patient-Specific Goal   Start Date: 10/19/2020  This Visit's Progress: On track  Priority: High  Note:   Current Barriers:  . Unable to drive, currently her car is giving her issues.  Pharmacist Clinical Goal(s):  Marland Kitchen Over the next 90 days, patient will achieve control of fasting blood sugars as evidenced by home monitoring and dietary changes. . maintain control of blood presuure as evidenced by home monitoring  through collaboration with PharmD and provider.    Interventions: . 1:1 collaboration with Buelah Manis, Modena Nunnery, MD regarding development and update of comprehensive plan of care as evidenced by provider attestation and co-signature . Inter-disciplinary care team collaboration (see longitudinal plan of care) . Comprehensive medication review performed; medication list updated in electronic medical  record  Hypertension (BP goal <130/80) -controlled -Current treatment: . Amlodipine 10mg  daily . Lisinopril 40mg  daily -Medications previously tried: None noted -Current home readings: none patient not monitoring at home -Current dietary habits: see below -Current exercise habits: none -Denies hypotensive/hypertensive symptoms -Educated on BP goals and benefits of medications for prevention of heart attack, stroke and kidney damage; Daily salt intake goal < 2300 mg; Importance of home blood pressure monitoring; -Counseled to monitor BP at home at least once weekly, document, and provide log at future appointments -Counseled on diet and exercise extensively Recommended to continue current medication  Hyperlipidemia: (LDL goal < 70) -controlled -Current treatment: . Atorvastatin 20mg  -Medications previously tried: none noted  -Current dietary patterns: see diabetes below -Current exercise habits: none -Educated on Benefits of statin for ASCVD risk reduction; Importance of limiting foods high in cholesterol;  - Reviewed most recent lipid panel -Recommended to continue current medication  Diabetes (A1c goal <7%) -controlled, based on last A1c -Current medications: Marland Kitchen Glipizide XL 10mg  daily -Medications previously tried: none noted  -Current home glucose readings . fasting glucose: 162 this morning. o [place]  153 -Denies hypoglycemic/hyperglycemic symptoms -Current meal patterns:  . breakfast: cereal (cheerios, rice krispies)  . lunch: leftovers from dinner. [place] sandwich, hamburger . dinner: fried chicken, fried pork chops, does not batter it . snacks: raisins . Drinks: water, tea, sprite (regular) -Current exercise: none -Educated onA1c and blood sugar goals; Benefits of routine self-monitoring of blood sugar; sugar content of various foods she was eating -Counseled to check feet daily and get yearly eye exams -Counseled on diet and  exercise extensively Recommended to continue current medication Recommended she replace her sprite with sprite zero, patient agreeable to plan   Patient Goals/Self-Care Activities . Over the next 90 days, patient will:  - take medications as prescribed focus on medication adherence by adherence packaging check glucose daily, document, and provide at future appointments engage in dietary modifications by replacing regular sprite with sprite zero or other sugar free alternative  Follow Up Plan: The care management team will reach out to the patient again over the next 90 days.        The patient verbalized understanding of instructions, educational materials, and care plan provided today and agreed to receive a mailed copy of patient instructions, educational materials, and care plan.   Telephone follow up appointment with pharmacy team member scheduled for: 3 months  Edythe Clarity, [facility]  Hyperglycemia Hyperglycemia occurs when the level of sugar (glucose) in the blood is too high. Glucose is a type of sugar that provides the body's main source of energy. Certain hormones (insulin and glucagon) control the level of glucose in the blood. Insulin lowers blood glucose, and glucagon increases blood glucose. Hyperglycemia can result from not having enough insulin in the bloodstream, or from the body not responding normally to insulin. Hyperglycemia occurs most often in people who have diabetes (diabetes mellitus), but it can happen in people who do not have diabetes. It can develop quickly, and it can be life-threatening if it causes you to become severely dehydrated (diabetic ketoacidosis or hyperglycemic hyperosmolar state). Severe hyperglycemia is a medical emergency. For most people with diabetes, a blood glucose level above 240 mg/dL is considered hyperglycemia. What are the causes? If you have diabetes, hyperglycemia may be caused by:  Medicines that increase blood glucose or affect  your diabetes control.  Getting less physical activity.  Eating more than planned.  Being sick or injured, having an infection, or having surgery.  Stress.  Not giving yourself enough insulin (if you are taking insulin). If you have undiagnosed diabetes, this may be the reason you have hyperglycemia. If you do not have diabetes, hyperglycemia may be caused by:  Certain medicines, including: ? Steroid medicines. ? Beta-blockers. ? Epinephrine. ? Thiazide diuretics.  Stress.  Having a serious illness, an infection, or surgery.  Diseases of the pancreas. What increases the risk? Hyperglycemia is more likely to develop in people who have risk factors for diabetes, such as:  Having a family member with diabetes.  Certain conditions in which the body's disease-fighting system (immune system) attacks itself (autoimmune disorders).  Being overweight or obese.  Having an inactive (sedentary) lifestyle.  Having been diagnosed with insulin resistance.  Having a history of prediabetes, gestational diabetes, or polycystic ovarian syndrome (PCOS). What are the signs or symptoms? Hyperglycemia may not cause any symptoms. If you do have symptoms, they may include:  Increased thirst.  Needing to urinate more often than usual.  Hunger.  Feeling very tired.  Blurry vision. Other symptoms may develop if hyperglycemia gets worse, such as:  Dry mouth.  Abdominal pain.  Loss of appetite.  Fruity-smelling breath.  Weakness.  Unexpected weight loss.  Tingling or numbness in the hands or feet.  Headache.  Cuts or bruises that are slow to heal. How is this diagnosed? Hyperglycemia is diagnosed with a blood test to measure your blood glucose level. This blood test is usually done while you are having symptoms. Your health care provider may also do a physical exam and review your medical history. You may have more tests to determine the cause of your hyperglycemia, such  as:  A fasting blood glucose (FBG) test. You will not be allowed to eat (you will fast) for at least 8 hours before a blood sample is taken.  An A1C blood test. This provides information about blood glucose control over the previous 2-3 months.  An oral glucose tolerance test (OGTT). This measures your blood glucose at two times: ? After fasting. This is your baseline blood glucose level. ? 2 hours after drinking a beverage that contains glucose. How is this treated? Treatment depends on the cause of your hyperglycemia. Treatment may include:  Taking medicine to regulate your blood glucose levels. If you take insulin or other diabetes medicines, your medicine or dosage may be adjusted.  Lifestyle changes, such as exercising more, eating healthier foods, or losing weight.  Treating an illness or infection.  Checking your blood glucose more often.  Stopping or reducing steroid medicines. If your hyperglycemia becomes severe and it results in diabetic ketoacidosis or hyperglycemic hyperosmolar state, you must be hospitalized and given IV fluids and IV insulin. Follow these instructions at home: General instructions  Take over-the-counter and prescription medicines only as told by your health care provider.  Do not use any products that contain nicotine or tobacco. These products include cigarettes, chewing tobacco, and vaping devices, such as e-cigarettes. If you need help quitting, ask your health care provider.  If you drink alcohol: ? Limit how much you have to:  0-1 drink a day for women who are not pregnant.  0-2 drinks a day for men. ? Know how much alcohol is in a drink. In the [place] one drink equals one 12 oz bottle of beer (355 mL), one 5 oz glass of wine (148 mL), or one 1 oz glass of hard liquor (44 mL).  Learn to manage stress. If you need help with this, ask your health care provider.  Do exercises as told by your health care provider.  Keep all follow-up visits.  This is important. Eating and drinking  Maintain a healthy weight.  Stay hydrated, especially when you exercise, get sick, or spend time in hot temperatures.  Drink enough fluid to keep your urine pale yellow.   If you have diabetes:  Know the symptoms of hyperglycemia.  Follow your diabetes management plan as told by your health care provider. Make sure you: ? Take your insulin and medicines as told. ? Follow your exercise plan. ? Follow your meal plan. Eat on time, and do not skip meals. ? Check your blood glucose as often as told. Make sure to check your blood glucose before and after exercise. If you exercise longer or in a different way, check your blood glucose more often. ? Follow your sick day plan whenever you cannot eat or drink normally. Make this plan in advance with your health care provider.  Share your diabetes management plan with people in your workplace, school, and household.  Check your urine for ketones when  you are ill and as told by your health care provider.  Carry a medical alert card or wear medical alert jewelry.   Where to find more information American Diabetes Association: www.diabetes.org Contact a health care provider if:  Your blood glucose is at or above 240 mg/dL (13.3 mmol/L) for 2 days in a row.  You have problems keeping your blood glucose in your target range.  You have frequent episodes of hyperglycemia.  You have signs of illness, such as nausea, vomiting, or fever. Get help right away if:  Your blood glucose monitor reads "high" even when you are taking insulin.  You have trouble breathing.  You have a change in how you think, feel, or act (mental status).  You have nausea or vomiting that does not go away. These symptoms may represent a serious problem that is an emergency. Do not wait to see if the symptoms will go away. Get medical help right away. Call your local emergency services (911 in the [place] Do not drive yourself to  the hospital. Summary  Hyperglycemia occurs when the level of sugar (glucose) in the blood is too high.  Hyperglycemia can happen with or without diabetes, and severe hyperglycemia can be life-threatening.  Hyperglycemia is diagnosed with a blood test to measure your blood glucose level. This blood test is usually done while you are having symptoms. Your health care provider may also do a physical exam and review your medical history.  If you have diabetes, follow your diabetes management plan as told by your health care provider.  Contact your health care provider if you have problems keeping your blood glucose in your target range. This information is not intended to replace advice given to you by your health care provider. Make sure you discuss any questions you have with your health care provider. Document Revised: 06/11/2020 Document Reviewed: 06/11/2020 Elsevier Patient Education  2021 [employer].

## 2020-11-03 ENCOUNTER — Telehealth: Payer: Self-pay | Admitting: Pharmacist

## 2020-11-03 NOTE — Progress Notes (Addendum)
Chronic Care Management Pharmacy Assistant   Name: Rebekah Gutierrez  MRN: [id number] DOB: 16-Sep-1945  Reason for Encounter: Medication Review  PCP : [name], MD  Allergies:  No Known Allergies  Medications: Outpatient Encounter Medications as of 11/03/2020  Medication Sig   acetaminophen (TYLENOL) 500 MG tablet Take 1,000 mg by mouth every 6 (six) hours as needed for moderate pain.   amLODipine (NORVASC) 10 MG tablet Take 1 tablet (10 mg total) by mouth daily.   aspirin 325 MG EC tablet Take 325 mg by mouth daily.   atorvastatin (LIPITOR) 20 MG tablet Take 0.5 tablets (10 mg total) by mouth daily.   brimonidine (ALPHAGAN) 0.2 % ophthalmic solution Place 1 drop into the right eye in the morning and at bedtime.   calcium carbonate (OS-CAL) 600 MG TABS Take 600 mg by mouth 2 (two) times daily with a meal.   Cinnamon 500 MG TABS Take 500 mg by mouth at bedtime.    gabapentin (NEURONTIN) 300 MG capsule TAKE 1 CAPSULE BY MOUTH ONCE DAILY AT BEDTIME FOR BACK PAIN   glipiZIDE (GLUCOTROL XL) 10 MG 24 hr tablet Take 1 tablet (10 mg total) by mouth daily with breakfast.   glucose blood (ONETOUCH ULTRA) test strip USE 1 STRIP TO CHECK GLUCOSE TWICE DAILY AS DIRECTED   Lancets (ONETOUCH DELICA PLUS [id number]) MISC USE 1 LANCET TO CHECK GLUCOSE TWICE DAILY AS DIRECTED   lisinopril (ZESTRIL) 40 MG tablet Take 1 tablet (40 mg total) by mouth daily.   Methylcellulose, Laxative, (FIBER THERAPY) 500 MG TABS Take 500 mg by mouth daily.   Multiple Vitamin (MULTIVITAMIN WITH MINERALS) TABS tablet Take 1 tablet by mouth daily.   Omega-3 Fatty Acids (FISH OIL) 1000 MG CAPS tAKE 1000MG  TWICE A DAY FOR CHOLESTEROL (Patient taking differently: Take 1,000 mg by mouth in the morning and at bedtime.)   Potassium 99 MG TABS Take 99 mg by mouth daily.   VYZULTA 0.024 % SOLN Place 1 drop into both eyes at bedtime.    No facility-administered encounter medications on file as of 11/03/2020.    Current  Diagnosis: Patient Active Problem List   Diagnosis Date Noted   Incisional hernia, without obstruction or gangrene    Aortic atherosclerosis ([place]) 01/05/2020   Ventral hernia 06/09/2019   Hypertriglyceridemia 04/21/2019   Multiple lung nodules on CT 04/30/2017   Spinal stenosis of lumbar region 05/02/2016   DDD (degenerative disc disease), lumbar 05/02/2016   Constipation 06/15/2015   OAB (overactive bladder) 11/25/2014   Pain in the chest 09/02/2014   Nonspecific abnormal electrocardiogram (ECG) (EKG) 09/02/2014   Chest pain at rest 09/02/2014   Dizziness    Type 2 diabetes mellitus with hyperglycemia ([place]) 01/21/2014   Fecal incontinence alternating with constipation 01/21/2014   B12 deficiency 02/06/2013   Shoulder pain 08/23/2012   Encounter for screening colonoscopy 06/05/2012   Leg cramps 04/29/2012   Vertigo, benign positional 01/28/2012   Dyspepsia 11/29/2011   Insomnia 11/29/2011   Tobacco user 11/29/2011   Essential hypertension, benign 11/27/2011    Goals Addressed   None    Reviewed chart for medication changes ahead of medication coordination call.  No OVs, Consults, or hospital visits since last Pharmacist visit.  No medication changes indicated  BP Readings from Last 3 Encounters:  08/20/20 134/82  06/17/20 (!) 149/90  06/15/20 130/78    Lab Results  Component Value Date   HGBA1C 6.5 (H) 08/20/2020     Patient obtains medications  through Vials  90 Days   Last adherence delivery included: On 09/09/20 Amlodipine 10 mg Gabapentin 300 mg Glipizide ER 10 mg Lisinopril 40 mg  Patient declined meds last month: Amlodipine 10 mg (90 day supply 09/09/20) Gabapentin 300 mg (90 day supply on 09/09/20) Glipizide ER 10 mg (90 day supply on 09/09/20) Lisinopril 40 mg (90 day supply on 09/09/20) Atorvastatin 20 mg (90 day supply on 09/05/20) One Touch Ultra Blue Test Strips (Has enough on hand) One Touch Delica Plus Lancets (Has enough on hand)  Unable  to confirm because I was not able to reach the patient after a three day attempt.  Patient is not due for an adherence delivery at this time.  Called patient and reviewed medications and coordinated delivery.  This delivery to include: None  Patient declined the following medications: Amlodipine 10 mg (90 day supply 09/09/20) Gabapentin 300 mg (90 day supply on 09/09/20) Glipizide ER 10 mg (90 day supply on 09/09/20) Lisinopril 40 mg (90 day supply on 09/09/20) Atorvastatin 20 mg (90 day supply on 09/05/20) One Touch Ultra Blue Test Strips (Has enough on hand) One Touch Delica Plus Lancets (Has enough on hand)  Patient dont need any refills at this time.  Third unsuccessful telephone outreach was attempted today. The patient was referred to the pharmacist for assistance with care management and care coordination.    Follow-Up:  Coordination of Enhanced Pharmacy Services and Pharmacist Review   Charlann Lange, [place] Pharmacist Assistant [phone]  4 minutes spent in review, coordination, and documentation.  Reviewed by: [name], PharmD Clinical Pharmacist [facility] [phone]

## 2020-11-05 DIAGNOSIS — Z79899 Other long term (current) drug therapy: Secondary | ICD-10-CM | POA: Diagnosis not present

## 2020-11-05 DIAGNOSIS — H04123 Dry eye syndrome of bilateral lacrimal glands: Secondary | ICD-10-CM | POA: Diagnosis not present

## 2020-11-05 DIAGNOSIS — H401121 Primary open-angle glaucoma, left eye, mild stage: Secondary | ICD-10-CM | POA: Diagnosis not present

## 2020-11-05 DIAGNOSIS — Z961 Presence of intraocular lens: Secondary | ICD-10-CM | POA: Diagnosis not present

## 2020-11-05 DIAGNOSIS — H401113 Primary open-angle glaucoma, right eye, severe stage: Secondary | ICD-10-CM | POA: Diagnosis not present

## 2020-11-30 ENCOUNTER — Telehealth: Payer: Self-pay | Admitting: Pharmacist

## 2020-11-30 NOTE — Progress Notes (Addendum)
Chronic Care Management Pharmacy Assistant   Name: Atalaya Zappia. Galloway  MRN: [id number] DOB: Oct 17, 1945  Reason for Encounter: Medication Review-Medication Coordination call  Medications: Outpatient Encounter Medications as of 11/30/2020  Medication Sig   acetaminophen (TYLENOL) 500 MG tablet Take 1,000 mg by mouth every 6 (six) hours as needed for moderate pain.   amLODipine (NORVASC) 10 MG tablet Take 1 tablet (10 mg total) by mouth daily.   aspirin 325 MG EC tablet Take 325 mg by mouth daily.   atorvastatin (LIPITOR) 20 MG tablet Take 0.5 tablets (10 mg total) by mouth daily.   brimonidine (ALPHAGAN) 0.2 % ophthalmic solution Place 1 drop into the right eye in the morning and at bedtime.   calcium carbonate (OS-CAL) 600 MG TABS Take 600 mg by mouth 2 (two) times daily with a meal.   Cinnamon 500 MG TABS Take 500 mg by mouth at bedtime.    gabapentin (NEURONTIN) 300 MG capsule TAKE 1 CAPSULE BY MOUTH ONCE DAILY AT BEDTIME FOR BACK PAIN   glipiZIDE (GLUCOTROL XL) 10 MG 24 hr tablet Take 1 tablet (10 mg total) by mouth daily with breakfast.   glucose blood (ONETOUCH ULTRA) test strip USE 1 STRIP TO CHECK GLUCOSE TWICE DAILY AS DIRECTED   Lancets (ONETOUCH DELICA PLUS [id number]) MISC USE 1 LANCET TO CHECK GLUCOSE TWICE DAILY AS DIRECTED   lisinopril (ZESTRIL) 40 MG tablet Take 1 tablet (40 mg total) by mouth daily.   Methylcellulose, Laxative, (FIBER THERAPY) 500 MG TABS Take 500 mg by mouth daily.   Multiple Vitamin (MULTIVITAMIN WITH MINERALS) TABS tablet Take 1 tablet by mouth daily.   Omega-3 Fatty Acids (FISH OIL) 1000 MG CAPS tAKE 1000MG  TWICE A DAY FOR CHOLESTEROL (Patient taking differently: Take 1,000 mg by mouth in the morning and at bedtime.)   Potassium 99 MG TABS Take 99 mg by mouth daily.   VYZULTA 0.024 % SOLN Place 1 drop into both eyes at bedtime.    No facility-administered encounter medications on file as of 11/30/2020.    Reviewed chart for medication changes ahead of  medication coordination call.  No OVs, hospital visits since last care coordination call.  Consults: 11/05/20 Ophthalmology Matthew Saras, MD. For eye exam follow-up. Per note: Negative for floaters. STOPPED Dorzolamide-Timolol.  BP Readings from Last 3 Encounters:  08/20/20 134/82  06/17/20 (!) 149/90  06/15/20 130/78    Lab Results  Component Value Date   HGBA1C 6.5 (H) 08/20/2020     Patient obtains medications through Vials  90 Days   Last adherence delivery included: (90 DS 09/09/20) Amlodipine 10 mg Gabapentin 300 mg Glipizide ER 10 mg Lisinopril 40 mg  Patient declined meds last month: Amlodipine 10 mg (90 day supply 09/09/20) Gabapentin 300 mg (90 day supply on 09/09/20) Glipizide ER 10 mg (90 day supply on 09/09/20) Lisinopril 40 mg (90 day supply on 09/09/20) Atorvastatin 20 mg (90 day supply on 09/05/20) One Touch Ultra Blue Test Strips (Has enough on hand) One Touch Delica Plus Lancets (Has enough on hand)  Patient is due for next adherence delivery on: 12/07/20.  Called patient and reviewed medications and coordinated delivery.  This delivery to include: Amlodipine 10 mg  Gabapentin 300 mg  Glipizide ER 10 mg  Lisinopril 40 mg One Touch Ultra Blue Test Strips (Has enough on hand) One Touch Delica Plus Lancets (Has enough on hand) Dorzolamide/Timolol 22.3-6.8 place 1 drop into both eyes two times daily. Brimonide 0.2% Place 1 drop into the  right eye in the morning and at bedtime  Patient declined the following medications: (enough on hand) Atorvastatin 20 mg   Patient needs refills for: Brimonide 0.2% Place 1 drop into the right eye in the morning and at bedtime, requested   Confirmed delivery date of 12/07/20, advised patient that pharmacy will contact them the morning of delivery.   Follow-Up:Pharmacist Review  Charlann Lange, RMA Clinical Pharmacist Assistant [phone]   10 minutes spent in review, coordination, and  documentation.  Reviewed by: [name], PharmD Clinical Pharmacist [place] [facility] [phone]

## 2020-12-13 DIAGNOSIS — Z0189 Encounter for other specified special examinations: Secondary | ICD-10-CM | POA: Diagnosis not present

## 2020-12-13 DIAGNOSIS — H4010X0 Unspecified open-angle glaucoma, stage unspecified: Secondary | ICD-10-CM | POA: Diagnosis not present

## 2020-12-13 DIAGNOSIS — E1140 Type 2 diabetes mellitus with diabetic neuropathy, unspecified: Secondary | ICD-10-CM | POA: Diagnosis not present

## 2020-12-13 DIAGNOSIS — I10 Essential (primary) hypertension: Secondary | ICD-10-CM | POA: Diagnosis not present

## 2020-12-13 DIAGNOSIS — Z87891 Personal history of nicotine dependence: Secondary | ICD-10-CM | POA: Diagnosis not present

## 2020-12-13 DIAGNOSIS — E785 Hyperlipidemia, unspecified: Secondary | ICD-10-CM | POA: Diagnosis not present

## 2020-12-17 ENCOUNTER — Other Ambulatory Visit (HOSPITAL_COMMUNITY): Payer: Self-pay | Admitting: Internal Medicine

## 2020-12-17 DIAGNOSIS — Z13820 Encounter for screening for osteoporosis: Secondary | ICD-10-CM

## 2020-12-17 DIAGNOSIS — Z1231 Encounter for screening mammogram for malignant neoplasm of breast: Secondary | ICD-10-CM

## 2020-12-23 DIAGNOSIS — H401112 Primary open-angle glaucoma, right eye, moderate stage: Secondary | ICD-10-CM | POA: Diagnosis not present

## 2020-12-23 DIAGNOSIS — H01002 Unspecified blepharitis right lower eyelid: Secondary | ICD-10-CM | POA: Diagnosis not present

## 2020-12-23 DIAGNOSIS — H40052 Ocular hypertension, left eye: Secondary | ICD-10-CM | POA: Diagnosis not present

## 2020-12-23 DIAGNOSIS — H01001 Unspecified blepharitis right upper eyelid: Secondary | ICD-10-CM | POA: Diagnosis not present

## 2020-12-24 ENCOUNTER — Ambulatory Visit: Payer: Medicare Other | Admitting: Nurse Practitioner

## 2020-12-27 ENCOUNTER — Ambulatory Visit (INDEPENDENT_AMBULATORY_CARE_PROVIDER_SITE_OTHER): Payer: Medicare Other | Admitting: Nurse Practitioner

## 2020-12-27 ENCOUNTER — Encounter: Payer: Self-pay | Admitting: Nurse Practitioner

## 2020-12-27 ENCOUNTER — Other Ambulatory Visit: Payer: Self-pay

## 2020-12-27 VITALS — BP 136/78 | HR 83 | Temp 98.8°F | Ht 60.0 in | Wt 150.8 lb

## 2020-12-27 DIAGNOSIS — E781 Pure hyperglyceridemia: Secondary | ICD-10-CM

## 2020-12-27 DIAGNOSIS — E1165 Type 2 diabetes mellitus with hyperglycemia: Secondary | ICD-10-CM | POA: Diagnosis not present

## 2020-12-27 DIAGNOSIS — I700 Atherosclerosis of aorta: Secondary | ICD-10-CM

## 2020-12-27 DIAGNOSIS — Z720 Tobacco use: Secondary | ICD-10-CM

## 2020-12-27 DIAGNOSIS — R062 Wheezing: Secondary | ICD-10-CM

## 2020-12-27 DIAGNOSIS — I10 Essential (primary) hypertension: Secondary | ICD-10-CM

## 2020-12-27 MED ORDER — ALBUTEROL SULFATE HFA 108 (90 BASE) MCG/ACT IN AERS: 2.0000 | INHALATION_SPRAY | Freq: Four times a day (QID) | RESPIRATORY_TRACT | 0 refills | Status: AC | PRN

## 2020-12-27 NOTE — Assessment & Plan Note (Signed)
Chronic.  Blood pressure at goal of less than 140/90 today in clinic.  We will continue amlodipine 10 mg daily and lisinopril 40 mg daily.  CMP checked today.  Encourage patient to monitor blood pressure at home and notify us if consistently greater than 140/90.  Follow-up in 4 months.

## 2020-12-27 NOTE — Assessment & Plan Note (Signed)
Chronic.  Continue atorvastatin 20 mg daily.  Will check lipids today-patient is not fasting.  Goal LDL is less than 70.  Follow-up in 4 months.

## 2020-12-27 NOTE — Patient Instructions (Addendum)
F/u 4 months  Albuterol Dry Powder Inhaler (DPI) What is this medicine? ALBUTEROL (al Rebekah Gutierrez) is a bronchodilator. Bronchospasm is when you have trouble breathing and make loud or whistling sounds when you breathe. This drug opens the airways in the lungs so it is easier to breathe. This medicine may be used for other purposes; ask your health care provider or pharmacist if you have questions. COMMON BRAND NAME(S): ProAir digihaler, ProAir RespiClick What should I tell my health care provider before I take this medicine? They need to know if you have any of these conditions:  diabetes (high blood sugar)  heart disease  high blood pressure  irregular heartbeat or rhythm  pheochromocytoma  seizures  thyroid disease  an unusual or allergic reaction to albuterol, levalbuterol, lactose, other medicines, foods, dyes, or preservatives  pregnant or trying to get pregnant  breast-feeding How should I use this medicine? This medicine is inhaled through the mouth. Take it as directed on the prescription label. Do not use it more often than directed. This medicine comes with INSTRUCTIONS FOR USE. Ask your pharmacist for directions on how to use this medicine. Read the information carefully. Talk to your pharmacist or health care provider if you have questions. Talk to your health care provider about the use of this medicine in children. While it may be given to children as young as 4 for selected conditions, precautions do apply. Overdosage: If you think you have taken too much of this medicine contact a poison control center or emergency room at once. NOTE: This medicine is only for you. Do not share this medicine with others. What if I miss a dose? If you take this medication on a regular basis, take it as soon as you can. If it is almost time for your next dose, take only that dose. Do not take double or extra doses. What may interact with this medicine?  anti-infectives like  chloroquine and pentamidine  caffeine  cisapride  diuretics  medicines for colds  medicines for depression or for emotional or psychotic conditions  medicines for weight loss including some herbal products  methadone  some antibiotics like clarithromycin, erythromycin, levofloxacin, and linezolid  some heart medicines  steroid hormones like dexamethasone, cortisone, hydrocortisone  theophylline  thyroid hormones This list may not describe all possible interactions. Give your health care provider a list of all the medicines, herbs, non-prescription drugs, or dietary supplements you use. Also tell them if you smoke, drink alcohol, or use illegal drugs. Some items may interact with your medicine. What should I watch for while using this medicine? Visit your health care provider for regular checks on your progress. Tell your health care provider if your symptoms do not start to get better or if they get worse. If your symptoms get worse or if you are using this medicine more than normal, call your health care provider right away. Do not treat yourself for coughs, colds or allergies without asking your health care provider for advice. Some nonprescription medicine can affect this one. You and your health care provider should develop an Asthma Action Plan that is just for you. Be sure to know what to do if you are in the yellow (asthma is getting worse) or red (medical alert) zones. Your mouth may get dry. Chewing sugarless gum or sucking hard candy and drinking plenty of water may help. Contact your health care provider if the problem does not go away or is severe. What side effects may I  notice from receiving this medicine? Side effects that you should report to your doctor or health care professional as soon as possible:  allergic reactions (skin rash, itching or hives; swelling of the face, lips, or tongue)  fever  heartbeat rhythm changes (trouble breathing; chest pain;  dizziness; fast, irregular heartbeat; feeling faint or lightheaded, falls)  increase in blood pressure  muscle cramps, pain  muscle weakness  pain, tingling, numbness in the hands or feet  vomiting Side effects that usually do not require medical attention (report to your doctor or health care professional if they continue or are bothersome):  change in taste  cough  dry mouth  headache  nasal congestion (runny or stuffy nose)  sore throat  tremors  trouble sleeping  upset stomach This list may not describe all possible side effects. Call your doctor for medical advice about side effects. You may report side effects to FDA at 1-800-FDA-1088. Where should I keep my medicine? Keep out of the reach of children and pets. Store at room temperature between 15 and 30 degrees C (59 and 86 degrees F). Keep inhaler away from extreme heat, cold or humidity. Throw away 13 months after removing it from the foil pouch, when the dose counter reads "0" or after the expiration date, whichever is first. NOTE: This sheet is a summary. It may not cover all possible information. If you have questions about this medicine, talk to your doctor, pharmacist, or health care provider.  2021 Elsevier/Gold Standard (2020-07-07 16:40:39)

## 2020-12-27 NOTE — Assessment & Plan Note (Addendum)
Chronic.  She has been using tobacco more than 45 years and is trying to cut back.  Currently smokes 1/2 pack/day.  Given her recent increase in respiratory symptoms like wheezing and occasional cough, will prescribe albuterol rescue inhaler.  Educated patient that if she is using more than 2-3 times per week, will need to make a follow-up visit with [place] and probably start on a daily maintenance inhaler.  She has yearly CT scans of her chest to monitor lung nodules.  We will need to arrange after next follow-up.  Follow-up in 4 months.

## 2020-12-27 NOTE — Progress Notes (Signed)
Subjective:    Patient ID: Rebekah Gutierrez. Rebekah Gutierrez, female    DOB: [birth year], 75 y.o.   MRN: [id number]  HPI: Rebekah Gutierrez is a 75 y.o. female presenting for chronic disease follow-up  Chief Complaint  Patient presents with  . Establish Care    No medical concerns today   HYPERTENSION / HYPERLIPIDEMIA Patient is currently taking lisinopril 40 mg daily and amlodipine 10 mg daily.  She is taking atorvastatin 20 mg daily for cholesterol.  She is tolerating these medications well. Satisfied with current treatment? no Duration of hypertension: chronic BP monitoring frequency: not checking BP medication side effects: no Duration of hyperlipidemia: chronic Cholesterol medication side effects: no Aspirin: yes Recent stressors: no Recurrent headaches: no Visual changes: no Palpitations: no Dyspnea: no Chest pain: no Lower extremity edema: no Dizzy/lightheaded: no   DIABETES Has been eating more fried chicken, drinks regular Sprite. Drinks water.   Hypoglycemic episodes:yes; a couple of times but gets better when she eats Polydipsia/polyuria: no Visual disturbance: yes - blurred vision; sees a Doctor in [place] and needs to see a Specialist in [place].  States the "pressure is high in my eyes" Chest pain: no Paresthesias: yes; gabapentin does not help Glucose Monitoring: yes  Accucheck frequency:   Fasting glucose: 130-170; sometimes higher than 200  Taking Insulin?: no Retinal Examination: Up to Date Foot Exam: Not up to Date Diabetic Education: Not Completed Pneumovax: Not up to Date Influenza: Not up to Date Aspirin: no   CURRENT SMOKER Duration: 45 years Packs per day: 0.5 Associated signs and symptoms: Denies fever, shortness of breath.  Does endorse some wheezing and cough at times.  No Known Allergies  Outpatient Encounter Medications as of 12/27/2020  Medication Sig  . albuterol (VENTOLIN HFA) 108 (90 Base) MCG/ACT inhaler Inhale 2 puffs into the lungs every  6 (six) hours as needed for wheezing or shortness of breath.  Marland Kitchen acetaminophen (TYLENOL) 500 MG tablet Take 1,000 mg by mouth every 6 (six) hours as needed for moderate pain.  Marland Kitchen amLODipine (NORVASC) 10 MG tablet Take 1 tablet (10 mg total) by mouth daily.  Marland Kitchen aspirin 325 MG EC tablet Take 325 mg by mouth daily.  Marland Kitchen atorvastatin (LIPITOR) 20 MG tablet Take 0.5 tablets (10 mg total) by mouth daily.  . brimonidine (ALPHAGAN) 0.2 % ophthalmic solution Place 1 drop into the right eye in the morning and at bedtime.  . calcium carbonate (OS-CAL) 600 MG TABS Take 600 mg by mouth 2 (two) times daily with a meal.  . Cinnamon 500 MG TABS Take 500 mg by mouth at bedtime.   . gabapentin (NEURONTIN) 300 MG capsule TAKE 1 CAPSULE BY MOUTH ONCE DAILY AT BEDTIME FOR BACK PAIN  . glipiZIDE (GLUCOTROL XL) 10 MG 24 hr tablet Take 1 tablet (10 mg total) by mouth daily with breakfast.  . glucose blood (ONETOUCH ULTRA) test strip USE 1 STRIP TO CHECK GLUCOSE TWICE DAILY AS DIRECTED  . Lancets (ONETOUCH DELICA PLUS [id number]) MISC USE 1 LANCET TO CHECK GLUCOSE TWICE DAILY AS DIRECTED  . lisinopril (ZESTRIL) 40 MG tablet Take 1 tablet (40 mg total) by mouth daily.  . Methylcellulose, Laxative, (FIBER THERAPY) 500 MG TABS Take 500 mg by mouth daily.  . Multiple Vitamin (MULTIVITAMIN WITH MINERALS) TABS tablet Take 1 tablet by mouth daily.  . Omega-3 Fatty Acids (FISH OIL) 1000 MG CAPS tAKE 1000MG  TWICE A DAY FOR CHOLESTEROL (Patient taking differently: Take 1,000 mg by mouth in the morning and  at bedtime.)  . Potassium 99 MG TABS Take 99 mg by mouth daily.  Marland Kitchen VYZULTA 0.024 % SOLN Place 1 drop into both eyes at bedtime.    No facility-administered encounter medications on file as of 12/27/2020.    Patient Active Problem List   Diagnosis Date Noted  . Incisional hernia, without obstruction or gangrene   . Aortic atherosclerosis ([place]) 01/05/2020  . Ventral hernia 06/09/2019  . Hypertriglyceridemia 04/21/2019  . Multiple  lung nodules on CT 04/30/2017  . Spinal stenosis of lumbar region 05/02/2016  . DDD (degenerative disc disease), lumbar 05/02/2016  . Constipation 06/15/2015  . OAB (overactive bladder) 11/25/2014  . Pain in the chest 09/02/2014  . Nonspecific abnormal electrocardiogram (ECG) (EKG) 09/02/2014  . Chest pain at rest 09/02/2014  . Dizziness   . Type 2 diabetes mellitus with hyperglycemia ([place]) 01/21/2014  . Fecal incontinence alternating with constipation 01/21/2014  . B12 deficiency 02/06/2013  . Shoulder pain 08/23/2012  . Leg cramps 04/29/2012  . Vertigo, benign positional 01/28/2012  . Dyspepsia 11/29/2011  . Insomnia 11/29/2011  . Tobacco user 11/29/2011  . Essential hypertension, benign 11/27/2011    Past Medical History:  Diagnosis Date  . Diabetes ([place])   . Encounter for screening colonoscopy 06/05/2012  . GERD (gastroesophageal reflux disease)   . Hiatal hernia   . Hypertension   . Vertigo     Relevant past medical, surgical, family and social history reviewed and updated as indicated. Interim medical history since our last visit reviewed.  Review of Systems Per HPI unless specifically indicated above     Objective:    BP 136/78   Pulse 83   Temp 98.8 F (37.1 C)   Ht 5' (1.524 m)   Wt 150 lb 12.8 oz (68.4 kg)   SpO2 98%   BMI 29.45 kg/m   Wt Readings from Last 3 Encounters:  12/27/20 150 lb 12.8 oz (68.4 kg)  08/20/20 152 lb (68.9 kg)  05/24/20 (P) 150 lb (68 kg)    Physical Exam Vitals and nursing note reviewed.  Constitutional:      General: She is not in acute distress.    Appearance: Normal appearance. She is not toxic-appearing.  Cardiovascular:     Rate and Rhythm: Normal rate.     Heart sounds: Normal heart sounds.  Pulmonary:     Effort: Pulmonary effort is normal. No respiratory distress.     Breath sounds: Wheezing present. No rhonchi or rales.  Abdominal:     General: Abdomen is flat. Bowel sounds are normal.     Palpations: Abdomen  is soft.  Musculoskeletal:        General: Normal range of motion.     Cervical back: Normal range of motion.     Right lower leg: No edema.     Left lower leg: No edema.  Skin:    General: Skin is warm and dry.     Coloration: Skin is not jaundiced or pale.     Findings: No erythema.  Neurological:     Mental Status: She is alert and oriented to person, place, and time.     Motor: No weakness.     Gait: Gait normal.  Psychiatric:        Mood and Affect: Mood normal.        Behavior: Behavior normal.        Thought Content: Thought content normal.        Judgment: Judgment normal.  Assessment & Plan:   Problem List Items Addressed This Visit      Cardiovascular and Mediastinum   Essential hypertension, benign - Primary    Chronic.  Blood pressure at goal of less than 140/90 today in clinic.  We will continue amlodipine 10 mg daily and lisinopril 40 mg daily.  CMP checked today.  Encourage patient to monitor blood pressure at home and notify us if consistently greater than 140/90.  Follow-up in 4 months.      Relevant Orders   COMPLETE METABOLIC PANEL WITH GFR   Aortic atherosclerosis ([facility])    Chronic.  Continue atorvastatin 20 mg daily.  Will check lipids today-patient is not fasting.  Goal LDL is less than 70.  Follow-up in 4 months.        Endocrine   Type 2 diabetes mellitus with hyperglycemia ([facility])    Chronic.  Hemoglobin A1c checked today.  Patient is worried that her A1c will be elevated today due to recent high readings at home.  Discussed adding in another oral medication if A1c remains above goal of 7.5.  She is up-to-date on her eye exam and foot exam.  Will need urine microalbumin this year.  Follow-up in 4 months.      Relevant Orders   Hemoglobin A1c     Other   Tobacco user    Chronic.  She has been using tobacco more than 45 years and is trying to cut back.  Currently smokes 1/2 pack/day.  Given her recent increase in respiratory symptoms like  wheezing and occasional cough, will prescribe albuterol rescue inhaler.  Educated patient that if she is using more than 2-3 times per week, will need to make a follow-up visit with [place] and probably start on a daily maintenance inhaler.  She has yearly CT scans of her chest to monitor lung nodules.  We will need to arrange after next follow-up.  Follow-up in 4 months.      Hypertriglyceridemia    Chronic.  Continue atorvastatin 20 mg daily.  Will check lipids today-patient is not fasting.  Goal LDL is less than 70.  Follow-up in 4 months.      Relevant Orders   Lipid Panel    Other Visit Diagnoses    Wheezing       Relevant Medications   albuterol (VENTOLIN HFA) 108 (90 Base) MCG/ACT inhaler       Follow up plan: Return in about 4 months (around 04/28/2021) for follow up.

## 2020-12-27 NOTE — Assessment & Plan Note (Signed)
Chronic.  Hemoglobin A1c checked today.  Patient is worried that her A1c will be elevated today due to recent high readings at home.  Discussed adding in another oral medication if A1c remains above goal of 7.5.  She is up-to-date on her eye exam and foot exam.  Will need urine microalbumin this year.  Follow-up in 4 months.

## 2020-12-28 LAB — COMPLETE METABOLIC PANEL WITH GFR
AG Ratio: 1.5 (calc) (ref 1.0–2.5)
ALT: 20 U/L (ref 6–29)
AST: 21 U/L (ref 10–35)
Albumin: 4.3 g/dL (ref 3.6–5.1)
Alkaline phosphatase (APISO): 82 U/L (ref 37–153)
BUN: 17 mg/dL (ref 7–25)
CO2: 29 mmol/L (ref 20–32)
Calcium: 9.8 mg/dL (ref 8.6–10.4)
Chloride: 106 mmol/L (ref 98–110)
Creat: 0.93 mg/dL (ref 0.60–0.93)
GFR, Est African American: 71 mL/min/{1.73_m2} (ref >=60)
GFR, Est Non African American: 61 mL/min/{1.73_m2} (ref >=60)
Globulin: 2.9 g/dL (calc) (ref 1.9–3.7)
Glucose, Bld: 155 mg/dL — ABNORMAL HIGH (ref 65–99)
Potassium: 3.6 mmol/L (ref 3.5–5.3)
Sodium: 142 mmol/L (ref 135–146)
Total Bilirubin: 0.7 mg/dL (ref 0.2–1.2)
Total Protein: 7.2 g/dL (ref 6.1–8.1)

## 2020-12-28 LAB — LIPID PANEL
Cholesterol: 72 mg/dL (ref <200)
HDL: 30 mg/dL — ABNORMAL LOW (ref >=50)
LDL Cholesterol (Calc): 19 mg/dL (calc)
Non-HDL Cholesterol (Calc): 42 mg/dL (calc) (ref <130)
Total CHOL/HDL Ratio: 2.4 (calc) (ref <5.0)
Triglycerides: 157 mg/dL — ABNORMAL HIGH (ref <150)

## 2020-12-28 LAB — HEMOGLOBIN A1C
Hgb A1c MFr Bld: 6.4 % of total Hgb — ABNORMAL HIGH (ref <5.7)
Mean Plasma Glucose: 137 mg/dL
eAG (mmol/L): 7.6 mmol/L

## 2020-12-30 ENCOUNTER — Telehealth: Payer: Self-pay | Admitting: Pharmacist

## 2020-12-30 NOTE — Progress Notes (Addendum)
Chronic Care Management Pharmacy Assistant   Name: Rebekah Gutierrez  MRN: [id number] DOB: 1946/02/17  Reason for Encounter: Medication Review/Medication Coordination Call.   Medications: Outpatient Encounter Medications as of 12/30/2020  Medication Sig   acetaminophen (TYLENOL) 500 MG tablet Take 1,000 mg by mouth every 6 (six) hours as needed for moderate pain.   albuterol (VENTOLIN HFA) 108 (90 Base) MCG/ACT inhaler Inhale 2 puffs into the lungs every 6 (six) hours as needed for wheezing or shortness of breath.   amLODipine (NORVASC) 10 MG tablet Take 1 tablet (10 mg total) by mouth daily.   aspirin 325 MG EC tablet Take 325 mg by mouth daily.   atorvastatin (LIPITOR) 20 MG tablet Take 0.5 tablets (10 mg total) by mouth daily.   brimonidine (ALPHAGAN) 0.2 % ophthalmic solution Place 1 drop into the right eye in the morning and at bedtime.   calcium carbonate (OS-CAL) 600 MG TABS Take 600 mg by mouth 2 (two) times daily with a meal.   Cinnamon 500 MG TABS Take 500 mg by mouth at bedtime.    gabapentin (NEURONTIN) 300 MG capsule TAKE 1 CAPSULE BY MOUTH ONCE DAILY AT BEDTIME FOR BACK PAIN   glipiZIDE (GLUCOTROL XL) 10 MG 24 hr tablet Take 1 tablet (10 mg total) by mouth daily with breakfast.   glucose blood (ONETOUCH ULTRA) test strip USE 1 STRIP TO CHECK GLUCOSE TWICE DAILY AS DIRECTED   Lancets (ONETOUCH DELICA PLUS [id number]) MISC USE 1 LANCET TO CHECK GLUCOSE TWICE DAILY AS DIRECTED   lisinopril (ZESTRIL) 40 MG tablet Take 1 tablet (40 mg total) by mouth daily.   Methylcellulose, Laxative, (FIBER THERAPY) 500 MG TABS Take 500 mg by mouth daily.   Multiple Vitamin (MULTIVITAMIN WITH MINERALS) TABS tablet Take 1 tablet by mouth daily.   Omega-3 Fatty Acids (FISH OIL) 1000 MG CAPS tAKE 1000MG  TWICE A DAY FOR CHOLESTEROL (Patient taking differently: Take 1,000 mg by mouth in the morning and at bedtime.)   Potassium 99 MG TABS Take 99 mg by mouth daily.   VYZULTA 0.024 % SOLN Place 1 drop  into both eyes at bedtime.    No facility-administered encounter medications on file as of 12/30/2020.   Reviewed chart for medication changes ahead of medication coordination call.  No Consults, or hospital visits since last care coordination call.  Office Visits: 12/27/20 Rebekah Apley, NP.  Rebekah Gutierrez Albuterol Sulfate 108 (90 Base) MCG/ACT 2 puffs inhalation every 6 hours PRN.   BP Readings from Last 3 Encounters:  12/27/20 136/78  08/20/20 134/82  06/17/20 (!) 149/90    Lab Results  Component Value Date   HGBA1C 6.4 (H) 12/27/2020     Patient obtains medications through Vials  90 Days   Last adherence delivery included:  Amlodipine 10 mg  Gabapentin 300 mg  Glipizide ER 10 mg  Lisinopril 40 mg One Touch Ultra Blue Test Strips (Has enough on hand) One Touch Delica Plus Lancets (Has enough on hand) Dorzolamide/Timolol 22.3-6.8 place 1 drop into both eyes two times daily. Brimonide 0.2% Place 1 drop into the right eye in the morning and at bedtime.  Patient declined meds last month: Atorvastatin 20 mg   Patient is not due for an adherence delivery at this time.   Called patient and reviewed medications.  This delivery to include: None  Patient declined the following medications: Amlodipine 10 mg  Gabapentin 300 mg  Glipizide ER 10 mg  Lisinopril 40 mg One Touch Ultra Blue Test  Strips (Has enough on hand) One Touch Delica Plus Lancets (Has enough on hand) Dorzolamide/Timolol 22.3-6.8 place 1 drop into both eyes two times daily. Brimonide 0.2% Place 1 drop into the right eye in the morning and at bedtime. Atorvastatin 20 mg   Patient dont needs refills at this time.    Follow-Up:Pharmacist Review  Charlann Lange, RMA Clinical Pharmacist Assistant [phone]  10 minutes spent in review, coordination, and documentation.  Reviewed by: [name], PharmD Clinical Pharmacist [facility] [phone]

## 2021-01-14 DIAGNOSIS — E1121 Type 2 diabetes mellitus with diabetic nephropathy: Secondary | ICD-10-CM | POA: Diagnosis not present

## 2021-01-17 ENCOUNTER — Other Ambulatory Visit (HOSPITAL_COMMUNITY): Payer: Self-pay | Admitting: Family Medicine

## 2021-01-17 DIAGNOSIS — Z1231 Encounter for screening mammogram for malignant neoplasm of breast: Secondary | ICD-10-CM | POA: Diagnosis not present

## 2021-01-17 DIAGNOSIS — R809 Proteinuria, unspecified: Secondary | ICD-10-CM | POA: Diagnosis not present

## 2021-01-17 DIAGNOSIS — E785 Hyperlipidemia, unspecified: Secondary | ICD-10-CM | POA: Diagnosis not present

## 2021-01-17 DIAGNOSIS — F17209 Nicotine dependence, unspecified, with unspecified nicotine-induced disorders: Secondary | ICD-10-CM

## 2021-01-17 DIAGNOSIS — F17200 Nicotine dependence, unspecified, uncomplicated: Secondary | ICD-10-CM | POA: Diagnosis not present

## 2021-01-17 DIAGNOSIS — E1121 Type 2 diabetes mellitus with diabetic nephropathy: Secondary | ICD-10-CM | POA: Diagnosis not present

## 2021-01-17 DIAGNOSIS — I10 Essential (primary) hypertension: Secondary | ICD-10-CM | POA: Diagnosis not present

## 2021-01-17 DIAGNOSIS — H409 Unspecified glaucoma: Secondary | ICD-10-CM | POA: Diagnosis not present

## 2021-01-19 ENCOUNTER — Telehealth: Payer: Medicare Other

## 2021-01-20 ENCOUNTER — Telehealth: Payer: Medicare Other

## 2021-02-04 ENCOUNTER — Ambulatory Visit (HOSPITAL_COMMUNITY): Payer: Medicare Other

## 2021-02-04 ENCOUNTER — Inpatient Hospital Stay (HOSPITAL_COMMUNITY): Admission: RE | Admit: 2021-02-04 | Payer: Medicare Other | Source: Ambulatory Visit

## 2021-02-11 ENCOUNTER — Ambulatory Visit (HOSPITAL_COMMUNITY)
Admission: RE | Admit: 2021-02-11 | Discharge: 2021-02-11 | Disposition: A | Discharge status: Home / Self Care | Payer: Medicare Other | Source: Ambulatory Visit | Attending: Internal Medicine | Admitting: Internal Medicine

## 2021-02-11 ENCOUNTER — Other Ambulatory Visit: Payer: Self-pay

## 2021-02-11 DIAGNOSIS — Z13820 Encounter for screening for osteoporosis: Secondary | ICD-10-CM | POA: Insufficient documentation

## 2021-02-11 DIAGNOSIS — Z780 Asymptomatic menopausal state: Secondary | ICD-10-CM | POA: Diagnosis not present

## 2021-02-11 DIAGNOSIS — M85831 Other specified disorders of bone density and structure, right forearm: Secondary | ICD-10-CM | POA: Diagnosis not present

## 2021-02-11 DIAGNOSIS — Z1231 Encounter for screening mammogram for malignant neoplasm of breast: Secondary | ICD-10-CM | POA: Diagnosis not present

## 2021-02-16 ENCOUNTER — Other Ambulatory Visit: Payer: Self-pay

## 2021-02-16 ENCOUNTER — Ambulatory Visit (HOSPITAL_COMMUNITY)
Admission: RE | Admit: 2021-02-16 | Discharge: 2021-02-16 | Disposition: A | Discharge status: Home / Self Care | Payer: Medicare Other | Source: Ambulatory Visit | Attending: Family Medicine | Admitting: Family Medicine

## 2021-02-16 DIAGNOSIS — F17210 Nicotine dependence, cigarettes, uncomplicated: Secondary | ICD-10-CM | POA: Diagnosis not present

## 2021-02-16 DIAGNOSIS — F17209 Nicotine dependence, unspecified, with unspecified nicotine-induced disorders: Secondary | ICD-10-CM

## 2021-03-02 ENCOUNTER — Encounter (INDEPENDENT_AMBULATORY_CARE_PROVIDER_SITE_OTHER): Payer: Self-pay | Admitting: *Deleted

## 2021-03-04 DIAGNOSIS — Z79899 Other long term (current) drug therapy: Secondary | ICD-10-CM | POA: Diagnosis not present

## 2021-03-04 DIAGNOSIS — H401113 Primary open-angle glaucoma, right eye, severe stage: Secondary | ICD-10-CM | POA: Diagnosis not present

## 2021-03-04 DIAGNOSIS — Z961 Presence of intraocular lens: Secondary | ICD-10-CM | POA: Diagnosis not present

## 2021-03-04 DIAGNOSIS — H04123 Dry eye syndrome of bilateral lacrimal glands: Secondary | ICD-10-CM | POA: Diagnosis not present

## 2021-03-04 DIAGNOSIS — H401121 Primary open-angle glaucoma, left eye, mild stage: Secondary | ICD-10-CM | POA: Diagnosis not present

## 2021-04-01 DIAGNOSIS — E1121 Type 2 diabetes mellitus with diabetic nephropathy: Secondary | ICD-10-CM | POA: Diagnosis not present

## 2021-04-01 DIAGNOSIS — E785 Hyperlipidemia, unspecified: Secondary | ICD-10-CM | POA: Diagnosis not present

## 2021-04-08 DIAGNOSIS — E1140 Type 2 diabetes mellitus with diabetic neuropathy, unspecified: Secondary | ICD-10-CM | POA: Diagnosis not present

## 2021-04-08 DIAGNOSIS — H409 Unspecified glaucoma: Secondary | ICD-10-CM | POA: Diagnosis not present

## 2021-04-08 DIAGNOSIS — E1121 Type 2 diabetes mellitus with diabetic nephropathy: Secondary | ICD-10-CM | POA: Diagnosis not present

## 2021-04-08 DIAGNOSIS — R809 Proteinuria, unspecified: Secondary | ICD-10-CM | POA: Diagnosis not present

## 2021-04-08 DIAGNOSIS — F17200 Nicotine dependence, unspecified, uncomplicated: Secondary | ICD-10-CM | POA: Diagnosis not present

## 2021-04-08 DIAGNOSIS — E785 Hyperlipidemia, unspecified: Secondary | ICD-10-CM | POA: Diagnosis not present

## 2021-04-08 DIAGNOSIS — J449 Chronic obstructive pulmonary disease, unspecified: Secondary | ICD-10-CM | POA: Diagnosis not present

## 2021-04-08 DIAGNOSIS — I10 Essential (primary) hypertension: Secondary | ICD-10-CM | POA: Diagnosis not present

## 2021-04-11 ENCOUNTER — Other Ambulatory Visit (HOSPITAL_COMMUNITY): Payer: Self-pay | Admitting: Radiology

## 2021-04-11 DIAGNOSIS — J449 Chronic obstructive pulmonary disease, unspecified: Secondary | ICD-10-CM

## 2021-04-24 NOTE — Progress Notes (Signed)
Subjective:    Patient ID: Rebekah Gutierrez. Rebekah Gutierrez, female    DOB: April 19, 1946, 75 y.o.   MRN: [id number]  HPI: Rebekah Gutierrez. Rebekah Gutierrez is a 75 y.o. female presenting for follow up.  Chief Complaint  Patient presents with   Hypertension    Pt is not fasting   HYPERTENSION / HYPERLIPIDEMIA Currently taking amlodipine 10 mg daily, lisinopril 40 mg daily for blood pressure.  Not checking blood pressure regularly at home.  Taking atorvastatin 20 mg daily for cholesterol.  No issues with the medication.  Notices her ankles are getting more swollen toward the end of the day.  It is not painful and does not really bother her.  Satisfied with current treatment? yes Duration of hypertension: chronic BP monitoring frequency: not checking BP medication side effects: yes; pedal edema Duration of hyperlipidemia: chronic Cholesterol medication side effects: no Aspirin: yes Recent stressors: no Recurrent headaches: no Visual changes: no Palpitations: no Dyspnea: no Chest pain: no Lower extremity edema: yes Dizzy/lightheaded: no  DIABETES Currently taking glipizide 10 mg extended release.  No issues. Hypoglycemic episodes:no Polydipsia/polyuria: no Visual disturbance: no Chest pain: no Paresthesias: no Glucose Monitoring: yes  Accucheck frequency: daily fasting  Fasting glucose: 105-170 Taking Insulin?: no Retinal Examination: Up to Date Foot Exam: Up to Date Diabetic Education: Completed Pneumovax: Up to Date Influenza: Not up to Date Aspirin: yes Water: at least 2 bottles per day  TOBACCO USER Smoking Status: current smoker Smoking Amount: 0.5 ppd Smoking Onset: years  Smoking Quit Date: TBD Smoking triggers: stress Type of tobacco use: cigarettes Treatments attempted: going to try Chantix Pneumovax: UTD  No Known Allergies  Outpatient Encounter Medications as of 04/25/2021  Medication Sig   acetaminophen (TYLENOL) 500 MG tablet Take 1,000 mg by mouth every 6 (six) hours as needed for  moderate pain.   albuterol (VENTOLIN HFA) 108 (90 Base) MCG/ACT inhaler Inhale 2 puffs into the lungs every 6 (six) hours as needed for wheezing or shortness of breath.   amLODipine (NORVASC) 10 MG tablet Take 1 tablet (10 mg total) by mouth daily.   aspirin 325 MG EC tablet Take 325 mg by mouth daily.   atorvastatin (LIPITOR) 20 MG tablet Take 0.5 tablets (10 mg total) by mouth daily.   brimonidine (ALPHAGAN) 0.2 % ophthalmic solution Place 1 drop into the right eye in the morning and at bedtime.   calcium carbonate (OS-CAL) 600 MG TABS Take 600 mg by mouth 2 (two) times daily with a meal.   Cinnamon 500 MG TABS Take 500 mg by mouth at bedtime.    gabapentin (NEURONTIN) 300 MG capsule TAKE 1 CAPSULE BY MOUTH ONCE DAILY AT BEDTIME FOR BACK PAIN   glipiZIDE (GLUCOTROL XL) 10 MG 24 hr tablet Take 1 tablet (10 mg total) by mouth daily with breakfast.   glucose blood (ONETOUCH ULTRA) test strip USE 1 STRIP TO CHECK GLUCOSE TWICE DAILY AS DIRECTED   Lancets (ONETOUCH DELICA PLUS 123XX123) MISC USE 1 LANCET TO CHECK GLUCOSE TWICE DAILY AS DIRECTED   lisinopril (ZESTRIL) 40 MG tablet Take 1 tablet (40 mg total) by mouth daily.   Methylcellulose, Laxative, (FIBER THERAPY) 500 MG TABS Take 500 mg by mouth daily.   Multiple Vitamin (MULTIVITAMIN WITH MINERALS) TABS tablet Take 1 tablet by mouth daily.   Omega-3 Fatty Acids (FISH OIL) 1000 MG CAPS tAKE '1000MG'$  TWICE A DAY FOR CHOLESTEROL (Patient taking differently: Take 1,000 mg by mouth in the morning and at bedtime.)   Potassium 99  MG TABS Take 99 mg by mouth daily.   VYZULTA 0.024 % SOLN Place 1 drop into both eyes at bedtime.    [DISCONTINUED] ALPHAGAN P 0.1 % SOLN Apply 1 drop to eye 2 (two) times daily.   No facility-administered encounter medications on file as of 04/25/2021.    Patient Active Problem List   Diagnosis Date Noted   Incisional hernia, without obstruction or gangrene    Aortic atherosclerosis ([place]) 01/05/2020   Ventral hernia  06/09/2019   Hypertriglyceridemia 04/21/2019   Multiple lung nodules on CT 04/30/2017   Spinal stenosis of lumbar region 05/02/2016   DDD (degenerative disc disease), lumbar 05/02/2016   Constipation 06/15/2015   OAB (overactive bladder) 11/25/2014   Pain in the chest 09/02/2014   Nonspecific abnormal electrocardiogram (ECG) (EKG) 09/02/2014   Chest pain at rest 09/02/2014   Dizziness    Type 2 diabetes mellitus with hyperglycemia ([place]) 01/21/2014   Fecal incontinence alternating with constipation 01/21/2014   B12 deficiency 02/06/2013   Shoulder pain 08/23/2012   Leg cramps 04/29/2012   Vertigo, benign positional 01/28/2012   Dyspepsia 11/29/2011   Insomnia 11/29/2011   Tobacco user 11/29/2011   Essential hypertension, benign 11/27/2011    Past Medical History:  Diagnosis Date   Diabetes ([place])    Encounter for screening colonoscopy 06/05/2012   GERD (gastroesophageal reflux disease)    Hiatal hernia    Hypertension    Vertigo     Relevant past medical, surgical, family and social history reviewed and updated as indicated. Interim medical history since our last visit reviewed.  Review of Systems Per HPI unless specifically indicated above     Objective:    BP 126/82   Pulse 81   Temp 98.5 F (36.9 C)   Ht 5' (1.524 m)   Wt 152 lb 9.6 oz (69.2 kg)   SpO2 95%   BMI 29.80 kg/m   Wt Readings from Last 3 Encounters:  04/25/21 152 lb 9.6 oz (69.2 kg)  12/27/20 150 lb 12.8 oz (68.4 kg)  08/20/20 152 lb (68.9 kg)    Physical Exam Vitals and nursing note reviewed.  Constitutional:      General: She is not in acute distress.    Appearance: Normal appearance. She is not toxic-appearing.  Cardiovascular:     Rate and Rhythm: Normal rate.     Heart sounds: Normal heart sounds.  Pulmonary:     Effort: Pulmonary effort is normal. No respiratory distress.     Breath sounds: Wheezing present. No rhonchi or rales.  Abdominal:     General: Abdomen is flat. Bowel sounds  are normal.     Palpations: Abdomen is soft.  Musculoskeletal:        General: Normal range of motion.     Cervical back: Normal range of motion.     Right lower leg: No edema.     Left lower leg: No edema.     Comments: Trace pedal edema bilaterally nonpitting  Skin:    General: Skin is warm and dry.     Coloration: Skin is not jaundiced or pale.     Findings: No erythema.  Neurological:     Mental Status: She is alert and oriented to person, place, and time.     Motor: No weakness.     Gait: Gait normal.  Psychiatric:        Mood and Affect: Mood normal.        Behavior: Behavior normal.  Thought Content: Thought content normal.        Judgment: Judgment normal.      Assessment & Plan:   Problem List Items Addressed This Visit       Cardiovascular and Mediastinum   Essential hypertension, benign    Chronic.  Blood pressure at goal of less than 140/90 today in clinic.  Plan to continue amlodipine 10 mg daily, lisinopril 40 mg daily.  I think that most likely, her pedal edema is related to the high dose of amlodipine, however it is not bothersome so we will not change her medication at this time.  She does not want to start a new medication for blood pressure at this time, will continue amlodipine and lisinopril as prescribed.  We will check kidney function with electrolytes.  Follow-up in 4 months.      Relevant Orders   COMPLETE METABOLIC PANEL WITH GFR (Completed)   CBC with Differential/Platelet (Completed)     Endocrine   Type 2 diabetes mellitus with hyperglycemia ([facility])    Chronic.  It sounds like blood sugars are at goal at home.  Will check A1c today.  Foot exam and eye exam are up-to-date.  Needs urine microalbumin later this year.  Continue limiting simple sugars and carbohydrates-cut down on full sugar soda.  Follow-up in 4 months.      Relevant Orders   Lipid panel (Completed)   Hemoglobin A1c (Completed)     Other   Tobacco user    Chronic.   Breathing is stable currently.  She does receive low-dose lung cancer screening scan yearly.  Discussed cessation-she is interested in trying Chantix.  Has prescription from previous PCP at the pharmacy.      Other Visit Diagnoses     Urinary frequency    -  Primary   Relevant Orders   Urinalysis, Routine w reflex microscopic (Completed)        Follow up plan: Return in about 4 months (around 08/25/2021) for 3 months HTN/HLD/DM.

## 2021-04-25 ENCOUNTER — Other Ambulatory Visit: Payer: Self-pay

## 2021-04-25 ENCOUNTER — Encounter: Payer: Self-pay | Admitting: Nurse Practitioner

## 2021-04-25 ENCOUNTER — Ambulatory Visit (INDEPENDENT_AMBULATORY_CARE_PROVIDER_SITE_OTHER): Payer: Medicare Other | Admitting: Nurse Practitioner

## 2021-04-25 VITALS — BP 126/82 | HR 81 | Temp 98.5°F | Ht 60.0 in | Wt 152.6 lb

## 2021-04-25 DIAGNOSIS — I10 Essential (primary) hypertension: Secondary | ICD-10-CM

## 2021-04-25 DIAGNOSIS — H40052 Ocular hypertension, left eye: Secondary | ICD-10-CM | POA: Diagnosis not present

## 2021-04-25 DIAGNOSIS — H401112 Primary open-angle glaucoma, right eye, moderate stage: Secondary | ICD-10-CM | POA: Diagnosis not present

## 2021-04-25 DIAGNOSIS — R350 Frequency of micturition: Secondary | ICD-10-CM

## 2021-04-25 DIAGNOSIS — E1165 Type 2 diabetes mellitus with hyperglycemia: Secondary | ICD-10-CM | POA: Diagnosis not present

## 2021-04-25 DIAGNOSIS — Z720 Tobacco use: Secondary | ICD-10-CM

## 2021-04-25 LAB — URINALYSIS, ROUTINE W REFLEX MICROSCOPIC
Bacteria, UA: NONE SEEN /HPF
Bilirubin Urine: NEGATIVE
Glucose, UA: NEGATIVE
Hyaline Cast: NONE SEEN /LPF
Ketones, ur: NEGATIVE
Leukocytes,Ua: NEGATIVE
Nitrite: NEGATIVE
Protein, ur: NEGATIVE
Specific Gravity, Urine: 1.01 (ref 1.001–1.035)
WBC, UA: NONE SEEN /HPF (ref 0–5)
pH: 6.5 (ref 5.0–8.0)

## 2021-04-25 LAB — MICROSCOPIC MESSAGE

## 2021-04-26 ENCOUNTER — Other Ambulatory Visit: Payer: Medicare Other

## 2021-04-26 ENCOUNTER — Telehealth: Payer: Self-pay | Admitting: Pharmacist

## 2021-04-26 DIAGNOSIS — I10 Essential (primary) hypertension: Secondary | ICD-10-CM | POA: Diagnosis not present

## 2021-04-26 DIAGNOSIS — E1165 Type 2 diabetes mellitus with hyperglycemia: Secondary | ICD-10-CM | POA: Diagnosis not present

## 2021-04-26 NOTE — Progress Notes (Addendum)
Chronic Care Management Pharmacy Assistant   Name: Rebekah Kurman. Koff  MRN: [id number] DOB: 1946/09/05  Reason for Encounter: Medication Review  Medications: Outpatient Encounter Medications as of 04/26/2021  Medication Sig   acetaminophen (TYLENOL) 500 MG tablet Take 1,000 mg by mouth every 6 (six) hours as needed for moderate pain.   albuterol (VENTOLIN HFA) 108 (90 Base) MCG/ACT inhaler Inhale 2 puffs into the lungs every 6 (six) hours as needed for wheezing or shortness of breath.   amLODipine (NORVASC) 10 MG tablet Take 1 tablet (10 mg total) by mouth daily.   aspirin 325 MG EC tablet Take 325 mg by mouth daily.   atorvastatin (LIPITOR) 20 MG tablet Take 0.5 tablets (10 mg total) by mouth daily.   brimonidine (ALPHAGAN) 0.2 % ophthalmic solution Place 1 drop into the right eye in the morning and at bedtime.   calcium carbonate (OS-CAL) 600 MG TABS Take 600 mg by mouth 2 (two) times daily with a meal.   Cinnamon 500 MG TABS Take 500 mg by mouth at bedtime.    gabapentin (NEURONTIN) 300 MG capsule TAKE 1 CAPSULE BY MOUTH ONCE DAILY AT BEDTIME FOR BACK PAIN   glipiZIDE (GLUCOTROL XL) 10 MG 24 hr tablet Take 1 tablet (10 mg total) by mouth daily with breakfast.   glucose blood (ONETOUCH ULTRA) test strip USE 1 STRIP TO CHECK GLUCOSE TWICE DAILY AS DIRECTED   Lancets (ONETOUCH DELICA PLUS 123XX123) MISC USE 1 LANCET TO CHECK GLUCOSE TWICE DAILY AS DIRECTED   lisinopril (ZESTRIL) 40 MG tablet Take 1 tablet (40 mg total) by mouth daily.   Methylcellulose, Laxative, (FIBER THERAPY) 500 MG TABS Take 500 mg by mouth daily.   Multiple Vitamin (MULTIVITAMIN WITH MINERALS) TABS tablet Take 1 tablet by mouth daily.   Omega-3 Fatty Acids (FISH OIL) 1000 MG CAPS tAKE '1000MG'$  TWICE A DAY FOR CHOLESTEROL (Patient taking differently: Take 1,000 mg by mouth in the morning and at bedtime.)   Potassium 99 MG TABS Take 99 mg by mouth daily.   VYZULTA 0.024 % SOLN Place 1 drop into both eyes at bedtime.    No  facility-administered encounter medications on file as of 04/26/2021.    Reviewed chart for medication changes ahead of medication coordination call.  No hospital visits since last care coordination call.  Office Visits: 04/25/21 Candace Gallus, NP. For follow-up. Chart is still open.   Consults: 03/04/21 Ophthalmology Matthew Saras, MD For Glaucoma Evaluation. No medication changes.   No medication changes indicated.  BP Readings from Last 3 Encounters:  04/25/21 126/82  12/27/20 136/78  08/20/20 134/82    Lab Results  Component Value Date   HGBA1C 6.4 (H) 12/27/2020     Patient obtains medications through Vials  90 Days   Last adherence delivery included: 12/07/20 Amlodipine 10 mg  Gabapentin 300 mg  Glipizide ER 10 mg  Lisinopril 40 mg One Touch Ultra Blue Test Strips (Has enough on hand) One Touch Delica Plus Lancets (Has enough on hand) Dorzolamide/Timolol 22.3-6.8 place 1 drop into both eyes two times daily. Brimonide 0.2% Place 1 drop into the right eye in the morning and at bedtime  Patient declined (meds) last month: Amlodipine 10 mg  Gabapentin 300 mg  Glipizide ER 10 mg  Lisinopril 40 mg One Touch Ultra Blue Test Strips (Has enough on hand) One Touch Delica Plus Lancets (Has enough on hand) Dorzolamide/Timolol 22.3-6.8 place 1 drop into both eyes two times daily. Brimonide 0.2% Place 1  drop into the right eye in the morning and at bedtime. Atorvastatin 20 mg   Patient is due for next adherence delivery on: 05/05/21.  Called patient and reviewed medications and coordinated delivery.  This delivery to include: Atorvastatin 20 mg 1/2 tablet daily Pre-Cut  One Touch Ultra Blue Test Strips One Touch Delica Plus Lancets Chantix Pack 0.5 & 1 mg   Patient declined the following medications: Gabapentin 300 mg  Lisinopril 40 mg Glipizide ER 10 mg  Amlodipine 10 mg  Dorzolamide/Timolol 22.3-6.8 place 1 drop into both eyes two times  daily. Brimonide 0.2% Place 1 drop into the right eye in the morning and at bedtime.  Patient does not needs refills at this time.   Confirmed delivery date of 05/05/21, advised patient that pharmacy will contact them the morning of delivery.  Follow-Up:Pharmacist Review  Charlann Lange, RMA Clinical Pharmacist Assistant [phone]  10 minutes spent in review, coordination, and documentation.  Reviewed by: [name], PharmD Clinical Pharmacist [phone]

## 2021-04-27 LAB — CBC WITH DIFFERENTIAL/PLATELET
Absolute Monocytes: 494 cells/uL (ref 200–950)
Basophils Absolute: 31 cells/uL (ref 0–200)
Basophils Relative: 0.5 %
Eosinophils Absolute: 61 cells/uL (ref 15–500)
Eosinophils Relative: 1 %
HCT: 40.3 % (ref 35.0–45.0)
Hemoglobin: 13.5 g/dL (ref 11.7–15.5)
Lymphs Abs: 3325 cells/uL (ref 850–3900)
MCH: 28.5 pg (ref 27.0–33.0)
MCHC: 33.5 g/dL (ref 32.0–36.0)
MCV: 85 fL (ref 80.0–100.0)
MPV: 10.9 fL (ref 7.5–12.5)
Monocytes Relative: 8.1 %
Neutro Abs: 2190 cells/uL (ref 1500–7800)
Neutrophils Relative %: 35.9 %
Platelets: 240 10*3/uL (ref 140–400)
RBC: 4.74 10*6/uL (ref 3.80–5.10)
RDW: 12.9 % (ref 11.0–15.0)
Total Lymphocyte: 54.5 %
WBC: 6.1 10*3/uL (ref 3.8–10.8)

## 2021-04-27 LAB — LIPID PANEL
Cholesterol: 84 mg/dL (ref <200)
HDL: 31 mg/dL — ABNORMAL LOW (ref >=50)
LDL Cholesterol (Calc): 30 mg/dL (calc)
Non-HDL Cholesterol (Calc): 53 mg/dL (calc) (ref <130)
Total CHOL/HDL Ratio: 2.7 (calc) (ref <5.0)
Triglycerides: 157 mg/dL — ABNORMAL HIGH (ref <150)

## 2021-04-27 LAB — COMPLETE METABOLIC PANEL WITH GFR
AG Ratio: 1.5 (calc) (ref 1.0–2.5)
ALT: 23 U/L (ref 6–29)
AST: 20 U/L (ref 10–35)
Albumin: 4.3 g/dL (ref 3.6–5.1)
Alkaline phosphatase (APISO): 77 U/L (ref 37–153)
BUN: 14 mg/dL (ref 7–25)
CO2: 28 mmol/L (ref 20–32)
Calcium: 9.6 mg/dL (ref 8.6–10.4)
Chloride: 107 mmol/L (ref 98–110)
Creat: 0.71 mg/dL (ref 0.60–1.00)
Globulin: 2.8 g/dL (calc) (ref 1.9–3.7)
Glucose, Bld: 121 mg/dL — ABNORMAL HIGH (ref 65–99)
Potassium: 3.7 mmol/L (ref 3.5–5.3)
Sodium: 141 mmol/L (ref 135–146)
Total Bilirubin: 0.7 mg/dL (ref 0.2–1.2)
Total Protein: 7.1 g/dL (ref 6.1–8.1)
eGFR: 90 mL/min/{1.73_m2} (ref >=60)

## 2021-04-27 LAB — HEMOGLOBIN A1C
Hgb A1c MFr Bld: 6.7 % of total Hgb — ABNORMAL HIGH (ref <5.7)
Mean Plasma Glucose: 146 mg/dL
eAG (mmol/L): 8.1 mmol/L

## 2021-04-27 NOTE — Assessment & Plan Note (Signed)
Chronic.  It sounds like blood sugars are at goal at home.  Will check A1c today.  Foot exam and eye exam are up-to-date.  Needs urine microalbumin later this year.  Continue limiting simple sugars and carbohydrates-cut down on full sugar soda.  Follow-up in 4 months.

## 2021-04-27 NOTE — Assessment & Plan Note (Signed)
Chronic.  Blood pressure at goal of less than 140/90 today in clinic.  Plan to continue amlodipine 10 mg daily, lisinopril 40 mg daily.  I think that most likely, her pedal edema is related to the high dose of amlodipine, however it is not bothersome so we will not change her medication at this time.  She does not want to start a new medication for blood pressure at this time, will continue amlodipine and lisinopril as prescribed.  We will check kidney function with electrolytes.  Follow-up in 4 months.

## 2021-04-27 NOTE — Assessment & Plan Note (Signed)
Chronic.  Breathing is stable currently.  She does receive low-dose lung cancer screening scan yearly.  Discussed cessation-she is interested in trying Chantix.  Has prescription from previous PCP at the pharmacy.

## 2021-06-22 ENCOUNTER — Telehealth: Payer: Self-pay | Admitting: Pharmacist

## 2021-06-22 ENCOUNTER — Telehealth: Payer: Self-pay | Admitting: *Deleted

## 2021-06-22 MED ORDER — GABAPENTIN 300 MG PO CAPS: ORAL_CAPSULE | ORAL | 3 refills | Status: AC

## 2021-06-22 MED ORDER — AMLODIPINE BESYLATE 10 MG PO TABS: 10.0000 mg | ORAL_TABLET | Freq: Every day | ORAL | 3 refills | Status: AC

## 2021-06-22 MED ORDER — LISINOPRIL 40 MG PO TABS: 40.0000 mg | ORAL_TABLET | Freq: Every day | ORAL | 3 refills | Status: AC

## 2021-06-22 NOTE — Progress Notes (Addendum)
Chronic Care Management Pharmacy Assistant   Name: Rebekah Gutierrez  MRN: [id number] DOB: 09-27-1945  Reason for Encounter: Medication Review/Medication Coordination Call  Medications: Outpatient Encounter Medications as of 06/22/2021  Medication Sig   acetaminophen (TYLENOL) 500 MG tablet Take 1,000 mg by mouth every 6 (six) hours as needed for moderate pain.   albuterol (VENTOLIN HFA) 108 (90 Base) MCG/ACT inhaler Inhale 2 puffs into the lungs every 6 (six) hours as needed for wheezing or shortness of breath.   amLODipine (NORVASC) 10 MG tablet Take 1 tablet (10 mg total) by mouth daily.   aspirin 325 MG EC tablet Take 325 mg by mouth daily.   atorvastatin (LIPITOR) 20 MG tablet Take 0.5 tablets (10 mg total) by mouth daily.   brimonidine (ALPHAGAN) 0.2 % ophthalmic solution Place 1 drop into the right eye in the morning and at bedtime.   calcium carbonate (OS-CAL) 600 MG TABS Take 600 mg by mouth 2 (two) times daily with a meal.   Cinnamon 500 MG TABS Take 500 mg by mouth at bedtime.    gabapentin (NEURONTIN) 300 MG capsule TAKE 1 CAPSULE BY MOUTH ONCE DAILY AT BEDTIME FOR BACK PAIN   glipiZIDE (GLUCOTROL XL) 10 MG 24 hr tablet Take 1 tablet (10 mg total) by mouth daily with breakfast.   glucose blood (ONETOUCH ULTRA) test strip USE 1 STRIP TO CHECK GLUCOSE TWICE DAILY AS DIRECTED   Lancets (ONETOUCH DELICA PLUS [id number]) MISC USE 1 LANCET TO CHECK GLUCOSE TWICE DAILY AS DIRECTED   lisinopril (ZESTRIL) 40 MG tablet Take 1 tablet (40 mg total) by mouth daily.   Methylcellulose, Laxative, (FIBER THERAPY) 500 MG TABS Take 500 mg by mouth daily.   Multiple Vitamin (MULTIVITAMIN WITH MINERALS) TABS tablet Take 1 tablet by mouth daily.   Omega-3 Fatty Acids (FISH OIL) 1000 MG CAPS tAKE 1000MG  TWICE A DAY FOR CHOLESTEROL (Patient taking differently: Take 1,000 mg by mouth in the morning and at bedtime.)   Potassium 99 MG TABS Take 99 mg by mouth daily.   VYZULTA 0.024 % SOLN Place 1 drop into  both eyes at bedtime.    No facility-administered encounter medications on file as of 06/22/2021.   Reviewed chart for medication changes ahead of medication coordination call.  No OVs, Consults, or hospital visits since last care coordination call.  No medication changes indicated.  BP Readings from Last 3 Encounters:  04/25/21 126/82  12/27/20 136/78  08/20/20 134/82    Lab Results  Component Value Date   HGBA1C 6.7 (H) 04/26/2021     Patient obtains medications through Adherence Packaging  90 Days   Last adherence delivery included:  Atorvastatin 20 mg 1/2 tablet daily Pre-Cut  One Touch Ultra Blue Test Strips One Touch Delica Plus Lancets Chantix Pack 0.5 & 1 mg   Patient declined meds last month: Gabapentin 300 mg  Lisinopril 40 mg Glipizide ER 10 mg  Amlodipine 10 mg  Dorzolamide/Timolol 22.3-6.8 place 1 drop into both eyes two times daily. Brimonide 0.2% Place 1 drop into the right eye in the morning and at bedtime.  Patient is due for next adherence delivery on: 07/05/21.  Called patient and reviewed medications and coordinated delivery.  This delivery to include: Gabapentin 300 mg  Lisinopril 40 mg Glipizide ER 10 mg  Amlodipine 10 mg  One Touch Ultra Blue Test Strips One Touch Delica Plus Lancets Dorzolamide/Timolol 22.3-6.8 place 1 drop into both eyes two times daily.  Patient declined the following medications:  Chantix Pack 0.5 & 1 mg  Brimonide 0.2% Place 1 drop into the right eye in the morning and at bedtime  Patient needs refills for: Gabapentin 300 mg, Lisinopril 40 mg, and Amlodipine 10 mg.  Confirmed delivery date of 07/05/21, advised patient that pharmacy will contact them the morning of delivery.  [place], RMA Clinical Pharmacist Assistant [phone]   8 minutes spent in review, coordination, and documentation.  Reviewed by: [name], PharmD Clinical Pharmacist [phone]

## 2021-06-22 NOTE — Telephone Encounter (Signed)
-----   Message from Ulysses sent at 06/22/2021  2:12 PM EDT ----- Regarding: Medication Refill Hi the patient needs a refill for Gabapentin300 mg, Lisinopril40 mg, and Amlodipine10 mg sent to [facility] for 90 DS thanks.

## 2021-06-27 DIAGNOSIS — H401112 Primary open-angle glaucoma, right eye, moderate stage: Secondary | ICD-10-CM | POA: Diagnosis not present

## 2021-06-27 DIAGNOSIS — H40052 Ocular hypertension, left eye: Secondary | ICD-10-CM | POA: Diagnosis not present

## 2021-07-12 ENCOUNTER — Encounter (INDEPENDENT_AMBULATORY_CARE_PROVIDER_SITE_OTHER): Payer: Self-pay

## 2021-07-12 ENCOUNTER — Telehealth (INDEPENDENT_AMBULATORY_CARE_PROVIDER_SITE_OTHER): Payer: Self-pay

## 2021-07-12 MED ORDER — PEG 3350-KCL-NA BICARB-NACL 420 G PO SOLR: 4000.0000 mL | ORAL | 0 refills | Status: DC

## 2021-07-12 NOTE — Telephone Encounter (Signed)
LeighAnn Aitanna Haubner, CMA  

## 2021-07-13 ENCOUNTER — Telehealth (INDEPENDENT_AMBULATORY_CARE_PROVIDER_SITE_OTHER): Payer: Self-pay

## 2021-07-13 ENCOUNTER — Other Ambulatory Visit (INDEPENDENT_AMBULATORY_CARE_PROVIDER_SITE_OTHER): Payer: Self-pay

## 2021-07-13 DIAGNOSIS — Z1211 Encounter for screening for malignant neoplasm of colon: Secondary | ICD-10-CM

## 2021-07-13 NOTE — Telephone Encounter (Signed)
Referring MD/PCP: HALL  Procedure: TCS  Reason/Indication:  SCREENING   Has patient had this procedure before?  YES  If so, when, by whom and where?  2013  Is there a family history of colon cancer?  NO  Who?  What age when diagnosed?    Is patient diabetic? If yes, Type 1 or Type 2   YES, TYPE 2      Does patient have prosthetic heart valve or mechanical valve?  NO  Do you have a pacemaker/defibrillator?  NO  Has patient ever had endocarditis/atrial fibrillation? NO  Does patient use oxygen? NO  Has patient had joint replacement within last 12 months?  NO  Is patient constipated or do they take laxatives? NO  Does patient have a history of alcohol/drug use?  NO  Have you had a stroke/heart attack last 6 mths? NO  Do you take medicine for weight loss?  NO  For female patients,: do you still have your menstrual cycle? NO  Is patient on blood thinner such as Coumadin, Plavix and/or Aspirin? YES  Medications: asa 81 mg daily, lisinopril 40 mg daily, amlodipine 10 mg daily, glipizide 10mg  daily, atorvastatin 20 mg 1/2 tab daily, gabapentin 300 mg daily, Tylenol bid, fish oil 1000 mg bid, cinnamon 500 mg daily, calcium 1000 mg bid, potassium 100 mg bid, Nexium 20 mg daily, eye drops  Allergies: nkda  Medication Adjustment per Dr Jenetta Downer no glipizide the evening prior or the morning of your procedure  Procedure date & time: Tuesday 07/26/21 at 9:25

## 2021-07-13 NOTE — Telephone Encounter (Signed)
Ok to schedule.  Thanks,  Binta Statzer Castaneda Mayorga, MD Gastroenterology and Hepatology [facility]  

## 2021-07-14 ENCOUNTER — Encounter (INDEPENDENT_AMBULATORY_CARE_PROVIDER_SITE_OTHER): Payer: Self-pay

## 2021-07-20 NOTE — Patient Instructions (Signed)
Rebekah Gutierrez  07/20/2021     @PREFPERIOPPHARMACY @   Your procedure is scheduled on  07/26/2021.   Report to [facility] at  Early.M.   Call this number if you have problems the morning of surgery:  [phone]   Remember:  Follow the diet and prep instructions given to you by the office.      DO NOT take any medications for diabetes the morning of your surgery.    Take these medicines the morning of surgery with A SIP OF WATER             amlodipine, gabapentin.     Do not wear jewelry, make-up or nail polish.  Do not wear lotions, powders, or perfumes, or deodorant.  Do not shave 48 hours prior to surgery.  Men may shave face and neck.  Do not bring valuables to the hospital.  [facility] is not responsible for any belongings or valuables.  Contacts, dentures or bridgework may not be worn into surgery.  Leave your suitcase in the car.  After surgery it may be brought to your room.  For patients admitted to the hospital, discharge time will be determined by your treatment team.  Patients discharged the day of surgery will not be allowed to drive home and must have someone with them for 24 hours.    Special instructions:   DO NOT smoke tobacco or vape for 24 hours before your procedure.  Please read over the following fact sheets that you were given. Anesthesia Post-op Instructions and Care and Recovery After Surgery      Colonoscopy, Adult, Care After This sheet gives you information about how to care for yourself after your procedure. Your health care provider may also give you more specific instructions. If you have problems or questions, contact your health care provider. What can I expect after the procedure? After the procedure, it is common to have: A small amount of blood in your stool for 24 hours after the procedure. Some gas. Mild cramping or bloating of your abdomen. Follow these instructions at home: Eating and drinking  Drink enough  fluid to keep your urine pale yellow. Follow instructions from your health care provider about eating or drinking restrictions. Resume your normal diet as instructed by your health care provider. Avoid heavy or fried foods that are hard to digest. Activity Rest as told by your health care provider. Avoid sitting for a long time without moving. Get up to take short walks every 1-2 hours. This is important to improve blood flow and breathing. Ask for help if you feel weak or unsteady. Return to your normal activities as told by your health care provider. Ask your health care provider what activities are safe for you. Managing cramping and bloating  Try walking around when you have cramps or feel bloated. Apply heat to your abdomen as told by your health care provider. Use the heat source that your health care provider recommends, such as a moist heat pack or a heating pad. Place a towel between your skin and the heat source. Leave the heat on for 20-30 minutes. Remove the heat if your skin turns bright red. This is especially important if you are unable to feel pain, heat, or cold. You may have a greater risk of getting burned. General instructions If you were given a sedative during the procedure, it can affect you for several hours. Do not drive or operate machinery until  your health care provider says that it is safe. For the first 24 hours after the procedure: Do not sign important documents. Do not drink alcohol. Do your regular daily activities at a slower pace than normal. Eat soft foods that are easy to digest. Take over-the-counter and prescription medicines only as told by your health care provider. Keep all follow-up visits as told by your health care provider. This is important. Contact a health care provider if: You have blood in your stool 2-3 days after the procedure. Get help right away if you have: More than a small spotting of blood in your stool. Large blood clots in your  stool. Swelling of your abdomen. Nausea or vomiting. A fever. Increasing pain in your abdomen that is not relieved with medicine. Summary After the procedure, it is common to have a small amount of blood in your stool. You may also have mild cramping and bloating of your abdomen. If you were given a sedative during the procedure, it can affect you for several hours. Do not drive or operate machinery until your health care provider says that it is safe. Get help right away if you have a lot of blood in your stool, nausea or vomiting, a fever, or increased pain in your abdomen. This information is not intended to replace advice given to you by your health care provider. Make sure you discuss any questions you have with your health care provider. Document Revised: 07/04/2019 Document Reviewed: 03/24/2019 Elsevier Patient Education  [place] After This sheet gives you information about how to care for yourself after your procedure. Your health care provider may also give you more specific instructions. If you have problems or questions, contact your health care provider. What can I expect after the procedure? After the procedure, it is common to have: Tiredness. Forgetfulness about what happened after the procedure. Impaired judgment for important decisions. Nausea or vomiting. Some difficulty with balance. Follow these instructions at home: For the time period you were told by your health care provider:   Rest as needed. Do not participate in activities where you could fall or become injured. Do not drive or use machinery. Do not drink alcohol. Do not take sleeping pills or medicines that cause drowsiness. Do not make important decisions or sign legal documents. Do not take care of children on your own. Eating and drinking Follow the diet that is recommended by your health care provider. Drink enough fluid to keep your urine pale yellow. If  you vomit: Drink water, juice, or soup when you can drink without vomiting. Make sure you have little or no nausea before eating solid foods. General instructions Have a responsible adult stay with you for the time you are told. It is important to have someone help care for you until you are awake and alert. Take over-the-counter and prescription medicines only as told by your health care provider. If you have sleep apnea, surgery and certain medicines can increase your risk for breathing problems. Follow instructions from your health care provider about wearing your sleep device: Anytime you are sleeping, including during daytime naps. While taking prescription pain medicines, sleeping medicines, or medicines that make you drowsy. Avoid smoking. Keep all follow-up visits as told by your health care provider. This is important. Contact a health care provider if: You keep feeling nauseous or you keep vomiting. You feel light-headed. You are still sleepy or having trouble with balance after 24 hours. You develop a rash.  You have a fever. You have redness or swelling around the IV site. Get help right away if: You have trouble breathing. You have new-onset confusion at home. Summary For several hours after your procedure, you may feel tired. You may also be forgetful and have poor judgment. Have a responsible adult stay with you for the time you are told. It is important to have someone help care for you until you are awake and alert. Rest as told. Do not drive or operate machinery. Do not drink alcohol or take sleeping pills. Get help right away if you have trouble breathing, or if you suddenly become confused. This information is not intended to replace advice given to you by your health care provider. Make sure you discuss any questions you have with your health care provider. Document Revised: 05/13/2020 Document Reviewed: 07/31/2019 Elsevier Patient Education  2022 [employer].

## 2021-07-22 ENCOUNTER — Encounter (HOSPITAL_COMMUNITY): Payer: Self-pay

## 2021-07-22 ENCOUNTER — Encounter (HOSPITAL_COMMUNITY)
Admission: RE | Admit: 2021-07-22 | Discharge: 2021-07-22 | Disposition: A | Discharge status: Home / Self Care | Payer: Medicare Other | Source: Ambulatory Visit | Attending: Gastroenterology | Admitting: Gastroenterology

## 2021-07-22 DIAGNOSIS — Z01810 Encounter for preprocedural cardiovascular examination: Secondary | ICD-10-CM | POA: Diagnosis not present

## 2021-07-22 DIAGNOSIS — Z1211 Encounter for screening for malignant neoplasm of colon: Secondary | ICD-10-CM

## 2021-07-25 ENCOUNTER — Other Ambulatory Visit (INDEPENDENT_AMBULATORY_CARE_PROVIDER_SITE_OTHER): Payer: Self-pay

## 2021-07-25 ENCOUNTER — Telehealth: Payer: Self-pay

## 2021-07-25 NOTE — Chronic Care Management (AMB) (Addendum)
    Chronic Care Management Pharmacy Assistant   Name: Rebekah Gutierrez. Wank  MRN: [id number] DOB: 05/21/1946  Reason for Encounter: Medication adherence gap   Medications: Outpatient Encounter Medications as of 07/25/2021  Medication Sig   acetaminophen (TYLENOL) 500 MG tablet Take 1,000 mg by mouth every 6 (six) hours as needed for moderate pain.   albuterol (VENTOLIN HFA) 108 (90 Base) MCG/ACT inhaler Inhale 2 puffs into the lungs every 6 (six) hours as needed for wheezing or shortness of breath. (Patient not taking: No sig reported)   ALPHAGAN P 0.1 % SOLN Place 1 drop into both eyes 2 (two) times daily.   amLODipine (NORVASC) 10 MG tablet Take 1 tablet (10 mg total) by mouth daily.   Apoaequorin (PREVAGEN PO) Take 1 tablet by mouth daily.   aspirin 325 MG EC tablet Take 325 mg by mouth daily.   atorvastatin (LIPITOR) 20 MG tablet Take 0.5 tablets (10 mg total) by mouth daily.   Calcium Carb-Cholecalciferol (CALCIUM 1000 + D PO) Take 1 tablet by mouth daily.   Cinnamon 500 MG TABS Take 500 mg by mouth at bedtime.    dorzolamide-timolol (COSOPT) 22.3-6.8 MG/ML ophthalmic solution Place 1 drop into both eyes 2 (two) times daily.   gabapentin (NEURONTIN) 300 MG capsule TAKE 1 CAPSULE BY MOUTH ONCE DAILY AT BEDTIME FOR BACK PAIN   glipiZIDE (GLUCOTROL XL) 10 MG 24 hr tablet Take 1 tablet (10 mg total) by mouth daily with breakfast.   glucose blood (ONETOUCH ULTRA) test strip USE 1 STRIP TO CHECK GLUCOSE TWICE DAILY AS DIRECTED   Lancets (ONETOUCH DELICA PLUS GYFVCB44H) MISC USE 1 LANCET TO CHECK GLUCOSE TWICE DAILY AS DIRECTED   lisinopril (ZESTRIL) 40 MG tablet Take 1 tablet (40 mg total) by mouth daily.   Methylcellulose, Laxative, (FIBER THERAPY) 500 MG TABS Take 500 mg by mouth daily.   Multiple Vitamin (MULTIVITAMIN WITH MINERALS) TABS tablet Take 1 tablet by mouth daily.   Omega-3 Fatty Acids (FISH OIL) 1000 MG CAPS tAKE 1000MG  TWICE A DAY FOR CHOLESTEROL (Patient taking differently: Take  1,000 mg by mouth in the morning and at bedtime.)   polyethylene glycol-electrolytes (TRILYTE) 420 g solution Take 4,000 mLs by mouth as directed.   Potassium 99 MG TABS Take 99 mg by mouth daily.   VYZULTA 0.024 % SOLN Place 1 drop into both eyes at bedtime.    No facility-administered encounter medications on file as of 07/25/2021.   Patient Glipizide last filled date is 06/28/21 90 DS by [facility] and Lisinopril last filled date is 06/28/21 90 DS.   Corrie Mckusick, [place]

## 2021-07-26 ENCOUNTER — Ambulatory Visit (HOSPITAL_COMMUNITY): Payer: Medicare Other | Admitting: Anesthesiology

## 2021-07-26 ENCOUNTER — Encounter (HOSPITAL_COMMUNITY)
Admission: RE | Disposition: A | Discharge status: Home / Self Care | Payer: Self-pay | Source: Ambulatory Visit | Attending: Gastroenterology

## 2021-07-26 ENCOUNTER — Encounter (HOSPITAL_COMMUNITY): Payer: Self-pay | Admitting: Gastroenterology

## 2021-07-26 ENCOUNTER — Ambulatory Visit (HOSPITAL_COMMUNITY)
Admission: RE | Admit: 2021-07-26 | Discharge: 2021-07-26 | Disposition: A | Discharge status: Home / Self Care | Payer: Medicare Other | Source: Ambulatory Visit | Attending: Gastroenterology | Admitting: Gastroenterology

## 2021-07-26 DIAGNOSIS — I10 Essential (primary) hypertension: Secondary | ICD-10-CM | POA: Insufficient documentation

## 2021-07-26 DIAGNOSIS — Z09 Encounter for follow-up examination after completed treatment for conditions other than malignant neoplasm: Secondary | ICD-10-CM

## 2021-07-26 DIAGNOSIS — Z79899 Other long term (current) drug therapy: Secondary | ICD-10-CM | POA: Diagnosis not present

## 2021-07-26 DIAGNOSIS — D127 Benign neoplasm of rectosigmoid junction: Secondary | ICD-10-CM | POA: Insufficient documentation

## 2021-07-26 DIAGNOSIS — E119 Type 2 diabetes mellitus without complications: Secondary | ICD-10-CM | POA: Insufficient documentation

## 2021-07-26 DIAGNOSIS — K621 Rectal polyp: Secondary | ICD-10-CM | POA: Diagnosis not present

## 2021-07-26 DIAGNOSIS — Z7984 Long term (current) use of oral hypoglycemic drugs: Secondary | ICD-10-CM | POA: Insufficient documentation

## 2021-07-26 DIAGNOSIS — Z1211 Encounter for screening for malignant neoplasm of colon: Secondary | ICD-10-CM | POA: Diagnosis not present

## 2021-07-26 DIAGNOSIS — F17210 Nicotine dependence, cigarettes, uncomplicated: Secondary | ICD-10-CM | POA: Insufficient documentation

## 2021-07-26 DIAGNOSIS — D123 Benign neoplasm of transverse colon: Secondary | ICD-10-CM | POA: Insufficient documentation

## 2021-07-26 DIAGNOSIS — Z86010 Personal history of colonic polyps: Secondary | ICD-10-CM | POA: Diagnosis not present

## 2021-07-26 DIAGNOSIS — K5730 Diverticulosis of large intestine without perforation or abscess without bleeding: Secondary | ICD-10-CM | POA: Diagnosis not present

## 2021-07-26 DIAGNOSIS — K635 Polyp of colon: Secondary | ICD-10-CM | POA: Diagnosis not present

## 2021-07-26 DIAGNOSIS — D128 Benign neoplasm of rectum: Secondary | ICD-10-CM | POA: Diagnosis not present

## 2021-07-26 DIAGNOSIS — K219 Gastro-esophageal reflux disease without esophagitis: Secondary | ICD-10-CM | POA: Diagnosis not present

## 2021-07-26 HISTORY — PX: COLONOSCOPY WITH PROPOFOL: SHX5780

## 2021-07-26 HISTORY — PX: POLYPECTOMY: SHX5525

## 2021-07-26 LAB — HM COLONOSCOPY

## 2021-07-26 LAB — GLUCOSE, CAPILLARY: Glucose-Capillary: 127 mg/dL — ABNORMAL HIGH (ref 70–99)

## 2021-07-26 SURGERY — COLONOSCOPY WITH PROPOFOL
Anesthesia: General

## 2021-07-26 MED ORDER — PROPOFOL 500 MG/50ML IV EMUL
INTRAVENOUS | Status: DC | PRN
  Administered 2021-07-26: 150 ug/kg/min via INTRAVENOUS

## 2021-07-26 MED ORDER — PROPOFOL 10 MG/ML IV BOLUS
INTRAVENOUS | Status: DC | PRN
  Administered 2021-07-26: 40 mg via INTRAVENOUS
  Administered 2021-07-26: 100 mg via INTRAVENOUS

## 2021-07-26 MED ORDER — LIDOCAINE HCL (CARDIAC) PF 100 MG/5ML IV SOSY
PREFILLED_SYRINGE | INTRAVENOUS | Status: DC | PRN
  Administered 2021-07-26: 50 mg via INTRAVENOUS

## 2021-07-26 MED ORDER — LACTATED RINGERS IV SOLN: INTRAVENOUS | Status: DC | PRN

## 2021-07-26 NOTE — Transfer of Care (Signed)
Immediate Anesthesia Transfer of Care Note  Patient: Rebekah Gutierrez. [name]  Procedure(s) Performed: COLONOSCOPY WITH PROPOFOL POLYPECTOMY  Patient Location: Short Stay  Anesthesia Type:General  Level of Consciousness: awake and oriented  Airway & Oxygen Therapy: Patient Spontanous Breathing  Post-op Assessment: Report given to RN and Post -op Vital signs reviewed and stable  Post vital signs: Reviewed and stable  Last Vitals:  Vitals Value Taken Time  BP    Temp    Pulse    Resp    SpO2      Last Pain:  Vitals:   07/26/21 0916  TempSrc:   PainSc: 0-No pain      Patients Stated Pain Goal: 5 (59/16/38 4665)  Complications: No notable events documented.

## 2021-07-26 NOTE — Anesthesia Procedure Notes (Signed)
Date/Time: 07/26/2021 9:20 AM Performed by: Orlie Dakin, CRNA Pre-anesthesia Checklist: Emergency Drugs available, Patient identified, Suction available and Patient being monitored Patient Re-evaluated:Patient Re-evaluated prior to induction Oxygen Delivery Method: Nasal cannula Induction Type: IV induction Placement Confirmation: positive ETCO2

## 2021-07-26 NOTE — Op Note (Signed)
[facility] Patient Name: Rebekah Gutierrez Procedure Date: 07/26/2021 9:02 AM MRN: [id number] Date of Birth: 1946-08-31 Attending MD: Maylon Peppers ,  CSN: [id number] Age: 75 Admit Type: Outpatient Procedure:                Colonoscopy Indications:              High risk colon cancer surveillance: Personal                            history of colonic polyps Providers:                Maylon Peppers, Charlsie Quest. Theda Sers RN, RN, Nelma Rothman, Technician Referring MD:              Medicines:                Monitored Anesthesia Care Complications:            No immediate complications. Estimated Blood Loss:     Estimated blood loss: none. Procedure:                Pre-Anesthesia Assessment:                           - Prior to the procedure, a History and Physical                            was performed, and patient medications, allergies                            and sensitivities were reviewed. The patient's                            tolerance of previous anesthesia was reviewed.                           - The risks and benefits of the procedure and the                            sedation options and risks were discussed with the                            patient. All questions were answered and informed                            consent was obtained.                           - ASA Grade Assessment: II - A patient with mild                            systemic disease.                           After obtaining informed consent, the colonoscope  was passed under direct vision. Throughout the                            procedure, the patient's blood pressure, pulse, and                            oxygen saturations were monitored continuously. The                            [id number]-HQ190L ([id number]) scope was introduced through                            the anus and advanced to the the cecum, identified                            by  appendiceal orifice and ileocecal valve. The                            colonoscopy was performed without difficulty. The                            patient tolerated the procedure well. The quality                            of the bowel preparation was adequate. Scope In: 9:18:32 AM Scope Out: 9:50:43 AM Scope Withdrawal Time: 0 hours 24 minutes 47 seconds  Total Procedure Duration: 0 hours 32 minutes 11 seconds  Findings:      The perianal and digital rectal examinations were normal.      Two sessile polyps were found in the proximal rectum. The polyps were 4       to 8 mm in size. These polyps were removed with a hot snare. Resection       and retrieval were complete.      A 3 mm polyp was found in the proximal transverse colon. The polyp was       sessile. The polyp was removed with a cold snare. Resection and       retrieval were complete.      Multiple semi-sessile polyps were found in the rectum. The polyps were 3       to 6 mm in size. These had a hyperplastic appearance, as described in       previous colonoscopy.      Multiple small and large-mouthed diverticula were found in the entire       colon.      The retroflexed view of the distal rectum and anal verge was normal and       showed no anal or rectal abnormalities. Impression:               - Two 4 to 8 mm polyps in the proximal rectum,                            removed with a hot snare. Resected and retrieved.                           - One 3 mm  polyp in the proximal transverse colon,                            removed with a cold snare. Resected and retrieved.                           - Multiple 3 to 6 mm polyps in the rectum.                           - Diverticulosis in the entire examined colon.                           - The distal rectum and anal verge are normal on                            retroflexion view. Moderate Sedation:      Per Anesthesia Care Recommendation:           - Discharge patient to home  (ambulatory).                           - Resume previous diet.                           - Await pathology results.                           - Repeat colonoscopy for surveillance based on                            pathology results. Procedure Code(s):        --- Professional ---                           [phone], Colonoscopy, flexible; with removal of                            tumor(s), polyp(s), or other lesion(s) by snare                            technique Diagnosis Code(s):        --- Professional ---                           Z86.010, Personal history of colonic polyps                           K62.1, Rectal polyp                           K63.5, Polyp of colon                           K57.30, Diverticulosis of large intestine without                            perforation or abscess without bleeding CPT copyright  2019 American Medical Association. All rights reserved. The codes documented in this report are preliminary and upon coder review may  be revised to meet current compliance requirements. Maylon Peppers, MD Maylon Peppers,  07/26/2021 9:56:53 AM This report has been signed electronically. Number of Addenda: 0

## 2021-07-26 NOTE — Anesthesia Preprocedure Evaluation (Signed)
Anesthesia Evaluation  Patient identified by MRN, date of birth, ID band Patient awake    Reviewed: Allergy & Precautions, H&P , NPO status , Patient's Chart, lab work & pertinent test results, reviewed documented beta blocker date and time   Airway Mallampati: II  TM Distance: >3 FB Neck ROM: full    Dental no notable dental hx.    Pulmonary neg pulmonary ROS, Current Smoker and Patient abstained from smoking.,    Pulmonary exam normal breath sounds clear to auscultation       Cardiovascular Exercise Tolerance: Good hypertension, negative cardio ROS   Rhythm:regular Rate:Normal     Neuro/Psych negative neurological ROS  negative psych ROS   GI/Hepatic Neg liver ROS, hiatal hernia, GERD  Medicated,  Endo/Other  negative endocrine ROSdiabetes  Renal/GU negative Renal ROS  negative genitourinary   Musculoskeletal   Abdominal   Peds  Hematology negative hematology ROS (+)   Anesthesia Other Findings   Reproductive/Obstetrics negative OB ROS                             Anesthesia Physical Anesthesia Plan  ASA: 2  Anesthesia Plan: General   Post-op Pain Management:    Induction:   PONV Risk Score and Plan: Propofol infusion  Airway Management Planned:   Additional Equipment:   Intra-op Plan:   Post-operative Plan:   Informed Consent: I have reviewed the patients History and Physical, chart, labs and discussed the procedure including the risks, benefits and alternatives for the proposed anesthesia with the patient or authorized representative who has indicated his/her understanding and acceptance.     Dental Advisory Given  Plan Discussed with: CRNA  Anesthesia Plan Comments:         Anesthesia Quick Evaluation

## 2021-07-26 NOTE — H&P (Signed)
Rebekah Gutierrez is an 75 y.o. female.   Chief Complaint: History of colonic polyps HPI: 75 year old female with past medical history of diabetes, GERD, hypertension, coming for screening colonoscopy. The patient denies having any complaints such as melena, hematochezia, abdominal pain or distention, change in her bowel movement consistency or frequency, no changes in her weight recently.  No family history of colorectal cancer.  Last colonoscopy was performed in 2013, was found to have 4 polyps in the distal sigmoid:, Multiple small polyps in the rectum suspicious for hyperplastic polyps.  Had diverticulosis in the colon.  Pathology of the polyp was hyperplastic polyps.  Past Medical History:  Diagnosis Date   Diabetes ([place])    Encounter for screening colonoscopy 06/05/2012   GERD (gastroesophageal reflux disease)    Hiatal hernia    Hypertension    Vertigo     Past Surgical History:  Procedure Laterality Date   BACK SURGERY     L4-5 PLIF   BLADDER SURGERY     CERVICAL SPINE SURGERY     fusion   CHOLECYSTECTOMY, LAPAROSCOPIC     Remote Past   COLONOSCOPY  06/20/2012   Procedure: COLONOSCOPY;  Surgeon: Rogene Houston, MD;  Location: [facility];  Service: Endoscopy;  Laterality: N/A;  250   FOOT SURGERY     left    HAND SURGERY     left   INCISIONAL HERNIA REPAIR N/A 05/26/2020   Procedure: Jerry Caras WITH MESH;  Surgeon: Aviva Signs, MD;  Location: [facility] ORS;  Service: General;  Laterality: N/A;   TUBAL LIGATION      Family History  Problem Relation Age of Onset   Hypertension Mother    Stroke Mother    Hypertension Father    Cancer Father    Colon cancer Neg Hx    Social History:  reports that she has been smoking cigarettes. She has a 22.50 pack-year smoking history. She has never used smokeless tobacco. She reports that she does not drink alcohol and does not use drugs.  Allergies: No Known Allergies  Medications Prior to Admission  Medication Sig  Dispense Refill   acetaminophen (TYLENOL) 500 MG tablet Take 1,000 mg by mouth every 6 (six) hours as needed for moderate pain.     ALPHAGAN P 0.1 % SOLN Place 1 drop into both eyes 2 (two) times daily.     amLODipine (NORVASC) 10 MG tablet Take 1 tablet (10 mg total) by mouth daily. 90 tablet 3   Apoaequorin (PREVAGEN PO) Take 1 tablet by mouth daily.     aspirin 325 MG EC tablet Take 325 mg by mouth daily.     atorvastatin (LIPITOR) 20 MG tablet Take 0.5 tablets (10 mg total) by mouth daily. 90 tablet 3   Calcium Carb-Cholecalciferol (CALCIUM 1000 + D PO) Take 1 tablet by mouth daily.     Cinnamon 500 MG TABS Take 500 mg by mouth at bedtime.      dorzolamide-timolol (COSOPT) 22.3-6.8 MG/ML ophthalmic solution Place 1 drop into both eyes 2 (two) times daily.     gabapentin (NEURONTIN) 300 MG capsule TAKE 1 CAPSULE BY MOUTH ONCE DAILY AT BEDTIME FOR BACK PAIN 90 capsule 3   glipiZIDE (GLUCOTROL XL) 10 MG 24 hr tablet Take 1 tablet (10 mg total) by mouth daily with breakfast. 90 tablet 3   glucose blood (ONETOUCH ULTRA) test strip USE 1 STRIP TO CHECK GLUCOSE TWICE DAILY AS DIRECTED 100 each 3   lisinopril (ZESTRIL) 40 MG  tablet Take 1 tablet (40 mg total) by mouth daily. 90 tablet 3   Methylcellulose, Laxative, (FIBER THERAPY) 500 MG TABS Take 500 mg by mouth daily.     Multiple Vitamin (MULTIVITAMIN WITH MINERALS) TABS tablet Take 1 tablet by mouth daily.     Omega-3 Fatty Acids (FISH OIL) 1000 MG CAPS tAKE 1000MG  TWICE A DAY FOR CHOLESTEROL (Patient taking differently: Take 1,000 mg by mouth in the morning and at bedtime.)  0   polyethylene glycol-electrolytes (TRILYTE) 420 g solution Take 4,000 mLs by mouth as directed. 4000 mL 0   Potassium 99 MG TABS Take 99 mg by mouth daily.     VYZULTA 0.024 % SOLN Place 1 drop into both eyes at bedtime.      albuterol (VENTOLIN HFA) 108 (90 Base) MCG/ACT inhaler Inhale 2 puffs into the lungs every 6 (six) hours as needed for wheezing or shortness of  breath. (Patient not taking: No sig reported) 8 g 0   Lancets (ONETOUCH DELICA PLUS [id number]) MISC USE 1 LANCET TO CHECK GLUCOSE TWICE DAILY AS DIRECTED 100 each 3    Results for orders placed or performed during the hospital encounter of 07/26/21 (from the past 48 hour(s))  Glucose, capillary     Status: Abnormal   Collection Time: 07/26/21  7:50 AM  Result Value Ref Range   Glucose-Capillary 127 (H) 70 - 99 mg/dL    Comment: Glucose reference range applies only to samples taken after fasting for at least 8 hours.   No results found.  Review of Systems  Constitutional: Negative.   HENT: Negative.    Eyes: Negative.   Respiratory: Negative.    Cardiovascular: Negative.   Gastrointestinal: Negative.   Endocrine: Negative.   Genitourinary: Negative.   Musculoskeletal: Negative.   Skin: Negative.   Allergic/Immunologic: Negative.   Neurological: Negative.   Hematological: Negative.   Psychiatric/Behavioral: Negative.     Blood pressure 127/76, pulse 65, temperature 98.9 F (37.2 C), temperature source Oral, resp. rate 18, SpO2 99 %. Physical Exam  GENERAL: The patient is AO x3, in no acute distress. HEENT: Head is normocephalic and atraumatic. EOMI are intact. Mouth is well hydrated and without lesions. NECK: Supple. No masses LUNGS: Clear to auscultation. No presence of rhonchi/wheezing/rales. Adequate chest expansion HEART: RRR, normal s1 and s2. ABDOMEN: Soft, nontender, no guarding, no peritoneal signs, and nondistended. BS +. No masses. EXTREMITIES: Without any cyanosis, clubbing, rash, lesions or edema. NEUROLOGIC: AOx3, no focal motor deficit. SKIN: no jaundice, no rashes  Assessment/Plan 75 year old female with past medical history of diabetes, GERD, hypertension, coming for history of colon polyps. We will proceed with colonoscopy.  Harvel Quale, MD 07/26/2021, 8:46 AM

## 2021-07-26 NOTE — Anesthesia Postprocedure Evaluation (Signed)
Anesthesia Post Note  Patient: Rebekah Gutierrez. Javed  Procedure(s) Performed: COLONOSCOPY WITH PROPOFOL POLYPECTOMY  Patient location during evaluation: Phase II Anesthesia Type: General Level of consciousness: awake Pain management: pain level controlled Vital Signs Assessment: post-procedure vital signs reviewed and stable Respiratory status: spontaneous breathing and respiratory function stable Cardiovascular status: blood pressure returned to baseline and stable Postop Assessment: no headache and no apparent nausea or vomiting Anesthetic complications: no Comments: Late entry   No notable events documented.   Last Vitals:  Vitals:   07/26/21 0752 07/26/21 0957  BP: 127/76 (!) 101/48  Pulse: 65 68  Resp: 18 (!) 23  Temp: 37.2 C 36.5 C  SpO2: 99% 99%    Last Pain:  Vitals:   07/26/21 0957  TempSrc: Oral  PainSc: 0-No pain                 Louann Sjogren

## 2021-07-26 NOTE — Discharge Instructions (Addendum)
You are being discharged to home.  Resume your previous diet.  We are waiting for your pathology results.  Your physician has recommended a repeat colonoscopy for surveillance based on pathology results.  Restart aspirin tomorrow.

## 2021-07-28 ENCOUNTER — Encounter (HOSPITAL_COMMUNITY): Payer: Self-pay | Admitting: Gastroenterology

## 2021-07-28 LAB — SURGICAL PATHOLOGY

## 2021-08-02 ENCOUNTER — Encounter (INDEPENDENT_AMBULATORY_CARE_PROVIDER_SITE_OTHER): Payer: Self-pay | Admitting: *Deleted

## 2021-08-03 DIAGNOSIS — E785 Hyperlipidemia, unspecified: Secondary | ICD-10-CM | POA: Diagnosis not present

## 2021-08-03 DIAGNOSIS — E1121 Type 2 diabetes mellitus with diabetic nephropathy: Secondary | ICD-10-CM | POA: Diagnosis not present

## 2021-08-09 DIAGNOSIS — E1140 Type 2 diabetes mellitus with diabetic neuropathy, unspecified: Secondary | ICD-10-CM | POA: Diagnosis not present

## 2021-08-09 DIAGNOSIS — I10 Essential (primary) hypertension: Secondary | ICD-10-CM | POA: Diagnosis not present

## 2021-08-09 DIAGNOSIS — H409 Unspecified glaucoma: Secondary | ICD-10-CM | POA: Diagnosis not present

## 2021-08-09 DIAGNOSIS — E785 Hyperlipidemia, unspecified: Secondary | ICD-10-CM | POA: Diagnosis not present

## 2021-08-09 DIAGNOSIS — E1121 Type 2 diabetes mellitus with diabetic nephropathy: Secondary | ICD-10-CM | POA: Diagnosis not present

## 2021-08-09 DIAGNOSIS — R809 Proteinuria, unspecified: Secondary | ICD-10-CM | POA: Diagnosis not present

## 2021-08-09 DIAGNOSIS — F17200 Nicotine dependence, unspecified, uncomplicated: Secondary | ICD-10-CM | POA: Diagnosis not present

## 2021-08-09 DIAGNOSIS — J449 Chronic obstructive pulmonary disease, unspecified: Secondary | ICD-10-CM | POA: Diagnosis not present

## 2021-08-29 ENCOUNTER — Other Ambulatory Visit: Payer: Self-pay

## 2021-08-29 MED ORDER — ATORVASTATIN CALCIUM 20 MG PO TABS: 10.0000 mg | ORAL_TABLET | Freq: Every day | ORAL | 3 refills | Status: AC

## 2021-09-02 DIAGNOSIS — H401113 Primary open-angle glaucoma, right eye, severe stage: Secondary | ICD-10-CM | POA: Diagnosis not present

## 2021-09-02 DIAGNOSIS — H04123 Dry eye syndrome of bilateral lacrimal glands: Secondary | ICD-10-CM | POA: Diagnosis not present

## 2021-09-02 DIAGNOSIS — Z79899 Other long term (current) drug therapy: Secondary | ICD-10-CM | POA: Diagnosis not present

## 2021-09-02 DIAGNOSIS — Z961 Presence of intraocular lens: Secondary | ICD-10-CM | POA: Diagnosis not present

## 2021-09-02 DIAGNOSIS — H401121 Primary open-angle glaucoma, left eye, mild stage: Secondary | ICD-10-CM | POA: Diagnosis not present

## 2021-09-21 ENCOUNTER — Telehealth: Payer: Self-pay | Admitting: Pharmacist

## 2021-09-21 NOTE — Progress Notes (Addendum)
Chronic Care Management Pharmacy Assistant   Name: Rebekah Gutierrez. [place]  MRN: [id number] DOB: [birth year]   Reason for Encounter: Monthly Medication Coordination Call     Medications: Outpatient Encounter Medications as of 09/21/2021  Medication Sig   acetaminophen (TYLENOL) 500 MG tablet Take 1,000 mg by mouth every 6 (six) hours as needed for moderate pain.   albuterol (VENTOLIN HFA) 108 (90 Base) MCG/ACT inhaler Inhale 2 puffs into the lungs every 6 (six) hours as needed for wheezing or shortness of breath. (Patient not taking: No sig reported)   ALPHAGAN P 0.1 % SOLN Place 1 drop into both eyes 2 (two) times daily.   amLODipine (NORVASC) 10 MG tablet Take 1 tablet (10 mg total) by mouth daily.   Apoaequorin (PREVAGEN PO) Take 1 tablet by mouth daily.   aspirin 325 MG EC tablet Take 325 mg by mouth daily.   atorvastatin (LIPITOR) 20 MG tablet Take 0.5 tablets (10 mg total) by mouth daily.   Calcium Carb-Cholecalciferol (CALCIUM 1000 + D PO) Take 1 tablet by mouth daily.   Cinnamon 500 MG TABS Take 500 mg by mouth at bedtime.    dorzolamide-timolol (COSOPT) 22.3-6.8 MG/ML ophthalmic solution Place 1 drop into both eyes 2 (two) times daily.   gabapentin (NEURONTIN) 300 MG capsule TAKE 1 CAPSULE BY MOUTH ONCE DAILY AT BEDTIME FOR BACK PAIN   glipiZIDE (GLUCOTROL XL) 10 MG 24 hr tablet Take 1 tablet (10 mg total) by mouth daily with breakfast.   glucose blood (ONETOUCH ULTRA) test strip USE 1 STRIP TO CHECK GLUCOSE TWICE DAILY AS DIRECTED   Lancets (ONETOUCH DELICA PLUS [id number]) MISC USE 1 LANCET TO CHECK GLUCOSE TWICE DAILY AS DIRECTED   lisinopril (ZESTRIL) 40 MG tablet Take 1 tablet (40 mg total) by mouth daily.   Methylcellulose, Laxative, (FIBER THERAPY) 500 MG TABS Take 500 mg by mouth daily.   Multiple Vitamin (MULTIVITAMIN WITH MINERALS) TABS tablet Take 1 tablet by mouth daily.   Omega-3 Fatty Acids (FISH OIL) 1000 MG CAPS tAKE 1000MG  TWICE A DAY FOR CHOLESTEROL (Patient taking  differently: Take 1,000 mg by mouth in the morning and at bedtime.)   Potassium 99 MG TABS Take 99 mg by mouth daily.   varenicline (CHANTIX PAK) 0.5 MG X 11 & 1 MG X 42 tablet Take by mouth as directed.   VYZULTA 0.024 % SOLN Place 1 drop into both eyes at bedtime.    No facility-administered encounter medications on file as of 09/21/2021.    Reviewed chart for medication changes ahead of medication coordination call.  No OVs, Consults, or hospital visits since last care coordination call/Pharmacist visit. (If appropriate, list visit date, provider name)  No medication changes indicated OR if recent visit, treatment plan here.  BP Readings from Last 3 Encounters:  07/26/21 (!) 101/48  07/22/21 107/70  04/25/21 126/82    Lab Results  Component Value Date   HGBA1C 6.7 (H) 04/26/2021     Patient obtains medications through Vials  90 Days   Last adherence delivery included: (medication name and frequency)  Gabapentin 300 mg  Lisinopril 40 mg Glipizide ER 10 mg  Amlodipine 10 mg  One Touch Ultra Blue Test Strips One Touch Delica Plus Lancets Dorzolamide/Timolol 22.3-6.8 place 1 drop into both eyes two times daily.   Patient is due for next adherence delivery on: 10/04/21. Called patient and reviewed medications and coordinated delivery.  This delivery to include:  Gabapentin 300 mg  Lisinopril 40 mg Glipizide ER  10 mg  Amlodipine 10 mg  One Touch Ultra Blue Test Strips One Touch Delica Plus Lancets Dorzolamide/Timolol 22.3-6.8 place 1 drop into both eyes two times daily. Atorvastatin 20 mg 1 tablet daily   Patient needs refills for: One touch Ultra test strips and One touch Ultra lancets for 90 day supply.  Delivery date of 10/04/21, unable to reach patient left detailed VM advising patient that pharmacy will contact them the morning of delivery.   Star Rating Drugs: atorvastatin (LIPITOR) 20 MG tablet - last filled  08/29/21 37 days  glipiZIDE (GLUCOTROL XL) 10 MG  24 hr tablet - last filled 06/28/21 90 days  lisinopril (ZESTRIL) 40 MG tablet - last filled 06/28/21 90 days   No future appointments.   Multiple attempts were made to contact patient. Attempts were unsuccessful. / ls,CMA    Jobe Gibbon, [place] Pharmacist Assistant  [phone]  7 minutes spent in review, coordination, and documentation.  Reviewed by: [name], PharmD Clinical Pharmacist [phone]

## 2021-09-27 ENCOUNTER — Other Ambulatory Visit: Payer: Self-pay

## 2021-09-27 MED ORDER — GLIPIZIDE ER 10 MG PO TB24: 10.0000 mg | ORAL_TABLET | Freq: Every day | ORAL | 3 refills | Status: AC

## 2021-10-17 ENCOUNTER — Other Ambulatory Visit: Payer: Self-pay

## 2021-11-14 DIAGNOSIS — H43393 Other vitreous opacities, bilateral: Secondary | ICD-10-CM | POA: Diagnosis not present

## 2021-11-14 DIAGNOSIS — H401133 Primary open-angle glaucoma, bilateral, severe stage: Secondary | ICD-10-CM | POA: Diagnosis not present

## 2021-12-05 DIAGNOSIS — H40022 Open angle with borderline findings, high risk, left eye: Secondary | ICD-10-CM | POA: Diagnosis not present

## 2021-12-05 DIAGNOSIS — Z961 Presence of intraocular lens: Secondary | ICD-10-CM | POA: Diagnosis not present

## 2021-12-05 DIAGNOSIS — H401113 Primary open-angle glaucoma, right eye, severe stage: Secondary | ICD-10-CM | POA: Diagnosis not present

## 2021-12-16 ENCOUNTER — Other Ambulatory Visit: Payer: Self-pay | Admitting: Nurse Practitioner

## 2022-01-09 DIAGNOSIS — H40022 Open angle with borderline findings, high risk, left eye: Secondary | ICD-10-CM | POA: Diagnosis not present

## 2022-01-09 DIAGNOSIS — H2013 Chronic iridocyclitis, bilateral: Secondary | ICD-10-CM | POA: Diagnosis not present

## 2022-01-09 DIAGNOSIS — Z961 Presence of intraocular lens: Secondary | ICD-10-CM | POA: Diagnosis not present

## 2022-01-09 DIAGNOSIS — H401113 Primary open-angle glaucoma, right eye, severe stage: Secondary | ICD-10-CM | POA: Diagnosis not present

## 2022-01-30 DIAGNOSIS — H401121 Primary open-angle glaucoma, left eye, mild stage: Secondary | ICD-10-CM | POA: Diagnosis not present

## 2022-01-30 DIAGNOSIS — H401113 Primary open-angle glaucoma, right eye, severe stage: Secondary | ICD-10-CM | POA: Diagnosis not present

## 2022-01-30 DIAGNOSIS — H3581 Retinal edema: Secondary | ICD-10-CM | POA: Diagnosis not present

## 2022-01-30 DIAGNOSIS — Z961 Presence of intraocular lens: Secondary | ICD-10-CM | POA: Diagnosis not present

## 2022-01-30 DIAGNOSIS — H30033 Focal chorioretinal inflammation, peripheral, bilateral: Secondary | ICD-10-CM | POA: Diagnosis not present

## 2022-02-03 DIAGNOSIS — I10 Essential (primary) hypertension: Secondary | ICD-10-CM | POA: Diagnosis not present

## 2022-02-03 DIAGNOSIS — E1121 Type 2 diabetes mellitus with diabetic nephropathy: Secondary | ICD-10-CM | POA: Diagnosis not present

## 2022-02-07 DIAGNOSIS — E785 Hyperlipidemia, unspecified: Secondary | ICD-10-CM | POA: Diagnosis not present

## 2022-02-07 DIAGNOSIS — I10 Essential (primary) hypertension: Secondary | ICD-10-CM | POA: Diagnosis not present

## 2022-02-07 DIAGNOSIS — H409 Unspecified glaucoma: Secondary | ICD-10-CM | POA: Diagnosis not present

## 2022-02-07 DIAGNOSIS — J449 Chronic obstructive pulmonary disease, unspecified: Secondary | ICD-10-CM | POA: Diagnosis not present

## 2022-02-07 DIAGNOSIS — E1121 Type 2 diabetes mellitus with diabetic nephropathy: Secondary | ICD-10-CM | POA: Diagnosis not present

## 2022-02-07 DIAGNOSIS — R809 Proteinuria, unspecified: Secondary | ICD-10-CM | POA: Diagnosis not present

## 2022-02-07 DIAGNOSIS — H30033 Focal chorioretinal inflammation, peripheral, bilateral: Secondary | ICD-10-CM | POA: Diagnosis not present

## 2022-02-07 DIAGNOSIS — E1140 Type 2 diabetes mellitus with diabetic neuropathy, unspecified: Secondary | ICD-10-CM | POA: Diagnosis not present

## 2022-02-07 DIAGNOSIS — F17200 Nicotine dependence, unspecified, uncomplicated: Secondary | ICD-10-CM | POA: Diagnosis not present

## 2022-02-28 DIAGNOSIS — I10 Essential (primary) hypertension: Secondary | ICD-10-CM | POA: Diagnosis not present

## 2022-02-28 DIAGNOSIS — R6883 Chills (without fever): Secondary | ICD-10-CM | POA: Diagnosis not present

## 2022-02-28 DIAGNOSIS — R5383 Other fatigue: Secondary | ICD-10-CM | POA: Diagnosis not present

## 2022-02-28 DIAGNOSIS — J069 Acute upper respiratory infection, unspecified: Secondary | ICD-10-CM | POA: Diagnosis not present

## 2022-02-28 DIAGNOSIS — R059 Cough, unspecified: Secondary | ICD-10-CM | POA: Diagnosis not present

## 2022-03-13 NOTE — Addendum Note (Signed)
Encounter addended by: Annie Paras on: 03/13/2022 5:19 PM  Actions taken: Letter saved

## 2022-03-31 DIAGNOSIS — Z79899 Other long term (current) drug therapy: Secondary | ICD-10-CM | POA: Diagnosis not present

## 2022-03-31 DIAGNOSIS — H30033 Focal chorioretinal inflammation, peripheral, bilateral: Secondary | ICD-10-CM | POA: Diagnosis not present

## 2022-03-31 DIAGNOSIS — H401121 Primary open-angle glaucoma, left eye, mild stage: Secondary | ICD-10-CM | POA: Diagnosis not present

## 2022-03-31 DIAGNOSIS — H401113 Primary open-angle glaucoma, right eye, severe stage: Secondary | ICD-10-CM | POA: Diagnosis not present

## 2022-03-31 DIAGNOSIS — Z961 Presence of intraocular lens: Secondary | ICD-10-CM | POA: Diagnosis not present

## 2022-04-17 ENCOUNTER — Ambulatory Visit (INDEPENDENT_AMBULATORY_CARE_PROVIDER_SITE_OTHER): Payer: Medicare Other | Admitting: Gastroenterology

## 2022-04-17 ENCOUNTER — Encounter (INDEPENDENT_AMBULATORY_CARE_PROVIDER_SITE_OTHER): Payer: Self-pay | Admitting: Gastroenterology

## 2022-04-17 VITALS — BP 137/81 | HR 72 | Temp 99.0°F | Ht 60.0 in | Wt 147.7 lb

## 2022-04-17 DIAGNOSIS — R159 Full incontinence of feces: Secondary | ICD-10-CM | POA: Diagnosis not present

## 2022-04-17 DIAGNOSIS — R195 Other fecal abnormalities: Secondary | ICD-10-CM | POA: Diagnosis not present

## 2022-04-17 DIAGNOSIS — K594 Anal spasm: Secondary | ICD-10-CM | POA: Diagnosis not present

## 2022-04-17 DIAGNOSIS — R197 Diarrhea, unspecified: Secondary | ICD-10-CM

## 2022-04-17 DIAGNOSIS — R152 Fecal urgency: Secondary | ICD-10-CM

## 2022-04-17 MED ORDER — DICYCLOMINE HCL 10 MG PO CAPS: 10.0000 mg | ORAL_CAPSULE | Freq: Two times a day (BID) | ORAL | 1 refills | Status: DC | PRN

## 2022-04-17 NOTE — Patient Instructions (Signed)
Let's try switching to benefiber 1T three times per day with meals to see if this will bulk up your stools some and help prevent the looser stools I am also sending dicyclomine '10mg'$  for you to take up to twice daily, this may help with the looser stools and the rectal pains you are having as well As discussed, ultimately, anorectal manometry should be our next step to establish if there is weakening of the muscles in the pelvis/rectal area causing your issues with bowel control. This is done at [facility], please let me know if you would like to proceed with this  Follow up 3 months

## 2022-04-17 NOTE — Progress Notes (Unsigned)
Referring Provider: No ref. provider found Primary Care Physician:  No primary care provider on file. Primary GI Physician: new  Chief Complaint  Patient presents with   Diarrhea    Diarrhea. Started back in March. Has tried diet modification. Has about 2 -3 episodes per day. Has not tried any meds. Has seen some blood a couple of times in stool. Has had some rectal pain.    HPI:   Rebekah Gutierrez. Sanjuan is a 76 y.o. female with past medical history of DM, GERD, hiatal hernia, HTN, vertigo.   Patient presenting today for diarrhea, last OV was in 2016, though patient had colonoscopy in November 2022.   History:  At last OV, patient was having 2 formed stools daily with some occasional urgency or loose stools. Advised to continue with fiber supplement. Maintained on PPI for GERD with good management.   Present:  She states that she has history of rectal pain for months, this occurs every 2-3 months, she can feel it coming and will have to sit down or lay down and try to relax. She feels that pain feels somewhat like a squeezing or spasm like, pain usually lasts about 1-2 minutes and will pass.   She started having loose stools in March. She denies any medication changes or antibiotics prior to that. She is having up to 2-3 looser stools per day. Stools are not watery. States that she starts with more formed/harder stools, feeling somewhat constipated and then will transition to looser stools. She is having fecal urgency and some fecal incontinence with this. Denies any correlation with specific foods. She sometimes will feel a little "growl" in her stomach and know that looser stools are coming on, though other times she has no warning. She denies any abdominal pain, nausea or vomiting. Appetite is good, maybe around 5 pounds of weight loss as she reports 152 is her baseline, today is 147. Prior to this starting in march, she would have usually 1 BM per day. She is still taking a metamucil equivalent  fiber supplement daily, previously using dulcolax and stool softener but is no longer using these. She does not skip any days having a BM. She denies any big life changes or new stressors. She denies melena. She did have a couple of episodes of toilet tissue hematochezia, maybe 2 over the past month or two.   Last Colonoscopy:Nov 2022- Two 4 to 8 mm polyps in the proximal rectum, One 3 mm polyp in the proximal transverse colon - Multiple 3 to 6 mm polyps in the rectum. - Diverticulosis in the entire examined colon. - The distal rectum and anal verge are normal on retroflexion view. Last Endoscopy: ?  Recommendations:    Past Medical History:  Diagnosis Date   Diabetes ([place])    Encounter for screening colonoscopy 06/05/2012   GERD (gastroesophageal reflux disease)    Hiatal hernia    Hypertension    Vertigo     Past Surgical History:  Procedure Laterality Date   BACK SURGERY     L4-5 PLIF   BLADDER SURGERY     CERVICAL SPINE SURGERY     fusion   CHOLECYSTECTOMY, LAPAROSCOPIC     Remote Past   COLONOSCOPY  06/20/2012   Procedure: COLONOSCOPY;  Surgeon: Rogene Houston, MD;  [facility]: [facility];  Service: Endoscopy;  Laterality: N/A;  250   COLONOSCOPY WITH PROPOFOL N/A 07/26/2021   Procedure: COLONOSCOPY WITH PROPOFOL;  Surgeon: Harvel Quale, MD;  Location:  [facility];  Service: Gastroenterology;  Laterality: N/A;  9:25   FOOT SURGERY     left    HAND SURGERY     left   INCISIONAL HERNIA REPAIR N/A 05/26/2020   Procedure: Jerry Caras WITH MESH;  Surgeon: Aviva Signs, MD;  Location: [facility];  Service: General;  Laterality: N/A;   POLYPECTOMY  07/26/2021   Procedure: POLYPECTOMY;  Surgeon: Montez Morita, Quillian Quince, MD;  Location: [facility];  Service: Gastroenterology;;   TUBAL LIGATION      Current Outpatient Medications  Medication Sig Dispense Refill   acetaminophen (TYLENOL) 500 MG tablet Take 1,000 mg by mouth every 6 (six) hours as  needed for moderate pain.     albuterol (VENTOLIN HFA) 108 (90 Base) MCG/ACT inhaler Inhale 2 puffs into the lungs every 6 (six) hours as needed for wheezing or shortness of breath. 8 g 0   ALPHAGAN P 0.1 % SOLN Place 1 drop into both eyes 2 (two) times daily.     amLODipine (NORVASC) 10 MG tablet Take 1 tablet (10 mg total) by mouth daily. 90 tablet 3   Apoaequorin (PREVAGEN PO) Take 1 tablet by mouth daily.     aspirin 325 MG EC tablet Take 325 mg by mouth daily.     atorvastatin (LIPITOR) 20 MG tablet Take 0.5 tablets (10 mg total) by mouth daily. 90 tablet 3   Calcium Carb-Cholecalciferol (CALCIUM 1000 + D PO) Take 1 tablet by mouth daily.     Cinnamon 500 MG TABS Take 500 mg by mouth at bedtime.      dorzolamide-timolol (COSOPT) 22.3-6.8 MG/ML ophthalmic solution Place 1 drop into both eyes 2 (two) times daily.     gabapentin (NEURONTIN) 300 MG capsule TAKE 1 CAPSULE BY MOUTH ONCE DAILY AT BEDTIME FOR BACK PAIN 90 capsule 3   glipiZIDE (GLUCOTROL XL) 10 MG 24 hr tablet Take 1 tablet (10 mg total) by mouth daily with breakfast. 90 tablet 3   glucose blood (ONETOUCH ULTRA) test strip USE 1 STRIP TO CHECK GLUCOSE TWICE DAILY AS DIRECTED 100 each 3   Lancets (ONETOUCH DELICA PLUS [id number]) MISC USE 1 LANCET TO CHECK GLUCOSE TWICE DAILY AS DIRECTED 100 each 3   lisinopril (ZESTRIL) 40 MG tablet Take 1 tablet (40 mg total) by mouth daily. 90 tablet 3   Methylcellulose, Laxative, (FIBER THERAPY) 500 MG TABS Take 500 mg by mouth daily.     Multiple Vitamin (MULTIVITAMIN WITH MINERALS) TABS tablet Take 1 tablet by mouth daily.     Omega-3 Fatty Acids (FISH OIL) 1000 MG CAPS tAKE '1000MG'$  TWICE A DAY FOR CHOLESTEROL (Patient taking differently: Take 1,000 mg by mouth in the morning and at bedtime.)  0   Potassium 99 MG TABS Take 99 mg by mouth daily.     VYZULTA 0.024 % SOLN Place 1 drop into both eyes at bedtime.      No current facility-administered medications for this visit.    Allergies as of  04/17/2022   (No Known Allergies)    Family History  Problem Relation Age of Onset   Hypertension Mother    Stroke Mother    Hypertension Father    Cancer Father    Colon cancer Neg Hx     Social History   Socioeconomic History   Marital status: Divorced    Spouse name: Not on file   Number of children: Not on file   Years of education: Not on file   Highest education level:  Not on file  Occupational History   Not on file  Tobacco Use   Smoking status: Every Day    Packs/day: 0.50    Years: 45.00    Total pack years: 22.50    Types: Cigarettes    Passive exposure: Current   Smokeless tobacco: Never   Tobacco comments:    1/2 pack a day since age 15  Substance and Sexual Activity   Alcohol use: No    Alcohol/week: 0.0 standard drinks of alcohol   Drug use: No   Sexual activity: Yes    Birth control/protection: Post-menopausal, Surgical  Other Topics Concern   Not on file  Social History Narrative   Not on file   Social Determinants of Health   Financial Resource Strain: Low Risk  (04/15/2020)   Overall Financial Resource Strain (CARDIA)    Difficulty of Paying Living Expenses: Not very hard  Food Insecurity: Not on file  Transportation Needs: Not on file  Physical Activity: Not on file  Stress: Not on file  Social Connections: Not on file   Review of systems General: negative for malaise, night sweats, fever, chills, weight loss Neck: Negative for lumps, goiter, pain and significant neck swelling Resp: Negative for cough, wheezing, dyspnea at rest CV: Negative for chest pain, leg swelling, palpitations, orthopnea GI: denies melena, hematochezia, nausea, vomiting, diarrhea, constipation, dysphagia, odyonophagia, early satiety or unintentional weight loss. +loose stools +fecal incontinence and urgency +anorectal spasms MSK: Negative for joint pain or swelling, back pain, and muscle pain. Derm: Negative for itching or rash Psych: Denies depression, anxiety,  memory loss, confusion. No homicidal or suicidal ideation.  Heme: Negative for prolonged bleeding, bruising easily, and swollen nodes. Endocrine: Negative for cold or heat intolerance, polyuria, polydipsia and goiter. Neuro: negative for tremor, gait imbalance, syncope and seizures. The remainder of the review of systems is noncontributory.  Physical Exam: BP 137/81 (BP Location: Left Arm, Patient Position: Sitting, Cuff Size: Large)   Pulse 72   Temp 99 F (37.2 C) (Oral)   Ht 5' (1.524 m)   Wt 147 lb 11.2 oz (67 kg)   BMI 28.85 kg/m  General:   Alert and oriented. No distress noted. Pleasant and cooperative.  Head:  Normocephalic and atraumatic. Eyes:  Conjuctiva clear without scleral icterus. Mouth:  Oral mucosa pink and moist. Good dentition. No lesions. Heart: Normal rate and rhythm, s1 and s2 heart sounds present.  Lungs: Clear lung sounds in all lobes. Respirations equal and unlabored. Abdomen:  +BS, soft, non-tender and non-distended. No rebound or guarding. No HSM or masses noted. Rectal: Gustavus Bryant CMA present as witness, multiple external rectal skin tags present,  small internal hemorrhoids, no obvious lesions or masses, hypotonic sphincter tone  Derm: No palmar erythema or jaundice Msk:  Symmetrical without gross deformities. Normal posture. Extremities:  Without edema. Neurologic:  Alert and  oriented x4 Psych:  Alert and cooperative. Normal mood and affect.  Invalid input(s): "6 MONTHS"   ASSESSMENT: Rebekah Gutierrez is a 76 y.o. female presenting today for loose stools, fecal urgency/incontinence and rectal pain.  Recent Colonoscopy in November 2022 with multiple polyps and diverticulosis. Patient reports looser stools with fecal urgency and incontinence since march, though per chart review, this appears to have been an ongoing issue for the past few years. No watery stools. Typically start off with more formed stools then transitions to looser. Occasional toilet  tissue hematochezia but no melena. No weight loss. She also endorses some intermittent pain in  her rectal area that will last approximately 1 minute when it occurs. Sphincter tone hypotonic on exam. Suspect there is some aspect of IBS as well as anorectal sphincter dysfunction and that patient is likely experiencing anal sphincter spasms. Recommend switching to Benefiber 1T TID with meals and continue with plenty of water intake, will also Rx bentyl '10mg'$  for her to use up to twice per day to help with looser stools and potentially help with anal sphincter spasms. Discussed proceeding with anorectal manometry for further evaluation of her fecal incontinence. Patient would like to think about if she wants to proceed with this and let me know.    PLAN:  Benefiber 1T TID with meals  2. Atleast 64 oz of water per day  3. Rx Bentyl '10mg'$  BID PRN 4. Pt to let me know about referral to [facility] for anorectal manometry   All questions were answered, patient verbalized understanding and is in agreement with plan as outlined above.   3 months  Jahmeek Shirk L. Alver Sorrow, MSN, APRN, AGNP-C Adult-Gerontology Nurse Practitioner [facility]

## 2022-04-18 DIAGNOSIS — R195 Other fecal abnormalities: Secondary | ICD-10-CM | POA: Insufficient documentation

## 2022-04-18 DIAGNOSIS — K594 Anal spasm: Secondary | ICD-10-CM | POA: Insufficient documentation

## 2022-05-01 DIAGNOSIS — R768 Other specified abnormal immunological findings in serum: Secondary | ICD-10-CM | POA: Diagnosis not present

## 2022-05-01 DIAGNOSIS — Z79899 Other long term (current) drug therapy: Secondary | ICD-10-CM | POA: Diagnosis not present

## 2022-05-01 DIAGNOSIS — H30033 Focal chorioretinal inflammation, peripheral, bilateral: Secondary | ICD-10-CM | POA: Diagnosis not present

## 2022-05-03 ENCOUNTER — Other Ambulatory Visit: Payer: Self-pay | Admitting: Family Medicine

## 2022-05-08 DIAGNOSIS — H401113 Primary open-angle glaucoma, right eye, severe stage: Secondary | ICD-10-CM | POA: Diagnosis not present

## 2022-05-08 DIAGNOSIS — H2013 Chronic iridocyclitis, bilateral: Secondary | ICD-10-CM | POA: Diagnosis not present

## 2022-05-08 DIAGNOSIS — Z961 Presence of intraocular lens: Secondary | ICD-10-CM | POA: Diagnosis not present

## 2022-05-08 DIAGNOSIS — H40022 Open angle with borderline findings, high risk, left eye: Secondary | ICD-10-CM | POA: Diagnosis not present

## 2022-05-10 DIAGNOSIS — I10 Essential (primary) hypertension: Secondary | ICD-10-CM | POA: Diagnosis not present

## 2022-05-10 DIAGNOSIS — E1121 Type 2 diabetes mellitus with diabetic nephropathy: Secondary | ICD-10-CM | POA: Diagnosis not present

## 2022-05-17 DIAGNOSIS — F17200 Nicotine dependence, unspecified, uncomplicated: Secondary | ICD-10-CM | POA: Diagnosis not present

## 2022-05-17 DIAGNOSIS — E1165 Type 2 diabetes mellitus with hyperglycemia: Secondary | ICD-10-CM | POA: Diagnosis not present

## 2022-05-17 DIAGNOSIS — R809 Proteinuria, unspecified: Secondary | ICD-10-CM | POA: Diagnosis not present

## 2022-05-17 DIAGNOSIS — J449 Chronic obstructive pulmonary disease, unspecified: Secondary | ICD-10-CM | POA: Diagnosis not present

## 2022-05-17 DIAGNOSIS — I10 Essential (primary) hypertension: Secondary | ICD-10-CM | POA: Diagnosis not present

## 2022-05-17 DIAGNOSIS — H409 Unspecified glaucoma: Secondary | ICD-10-CM | POA: Diagnosis not present

## 2022-05-17 DIAGNOSIS — E1140 Type 2 diabetes mellitus with diabetic neuropathy, unspecified: Secondary | ICD-10-CM | POA: Diagnosis not present

## 2022-05-17 DIAGNOSIS — E785 Hyperlipidemia, unspecified: Secondary | ICD-10-CM | POA: Diagnosis not present

## 2022-05-30 ENCOUNTER — Other Ambulatory Visit: Payer: Self-pay | Admitting: Family Medicine

## 2022-06-06 DIAGNOSIS — H30033 Focal chorioretinal inflammation, peripheral, bilateral: Secondary | ICD-10-CM | POA: Diagnosis not present

## 2022-06-06 DIAGNOSIS — Z79899 Other long term (current) drug therapy: Secondary | ICD-10-CM | POA: Diagnosis not present

## 2022-06-06 DIAGNOSIS — Z1159 Encounter for screening for other viral diseases: Secondary | ICD-10-CM | POA: Diagnosis not present

## 2022-06-09 DIAGNOSIS — Z961 Presence of intraocular lens: Secondary | ICD-10-CM | POA: Diagnosis not present

## 2022-06-09 DIAGNOSIS — H401121 Primary open-angle glaucoma, left eye, mild stage: Secondary | ICD-10-CM | POA: Diagnosis not present

## 2022-06-09 DIAGNOSIS — H3581 Retinal edema: Secondary | ICD-10-CM | POA: Diagnosis not present

## 2022-06-09 DIAGNOSIS — Z79899 Other long term (current) drug therapy: Secondary | ICD-10-CM | POA: Diagnosis not present

## 2022-06-09 DIAGNOSIS — H401113 Primary open-angle glaucoma, right eye, severe stage: Secondary | ICD-10-CM | POA: Diagnosis not present

## 2022-06-09 DIAGNOSIS — H30033 Focal chorioretinal inflammation, peripheral, bilateral: Secondary | ICD-10-CM | POA: Diagnosis not present

## 2022-06-29 ENCOUNTER — Other Ambulatory Visit: Payer: Self-pay | Admitting: Family Medicine

## 2022-06-29 NOTE — Telephone Encounter (Signed)
Requested medication (s) are due for refill today: yes  Requested medication (s) are on the active medication list: yes  Last refill:  06/30/21  Future visit scheduled: no  Notes to clinic:  Unable to refill per protocol, another provider listed as PCP, routing for review.     Requested Prescriptions  Pending Prescriptions Disp Refills   lisinopril (ZESTRIL) 40 MG tablet [Pharmacy Med Name: lisinopril 40 mg tablet] 90 tablet 1    Sig: TAKE ONE TABLET BY MOUTH DAILY AT 9AM     Cardiovascular:  ACE Inhibitors Failed - 06/29/2022  6:02 AM      Failed - Cr in normal range and within 180 days    Creat  Date Value Ref Range Status  04/26/2021 0.71 0.60 - 1.00 mg/dL Final   Creatinine, Urine  Date Value Ref Range Status  12/17/2019 50 20 - 275 mg/dL Final         Failed - K in normal range and within 180 days    Potassium  Date Value Ref Range Status  04/26/2021 3.7 3.5 - 5.3 mmol/L Final         Failed - Valid encounter within last 6 months    Recent Outpatient Visits           1 year ago Urinary frequency   [place], Rebekah A, NP   1 year ago Essential hypertension, benign   [place] Rebekah Bear, NP   1 year ago Routine general medical examination at [facility]   [place], Rebekah Nunnery, MD   2 years ago Type 2 diabetes mellitus with hyperglycemia, without long-term current use of insulin ([place])   [place], Rebekah Nunnery, MD   2 years ago Type 2 diabetes mellitus with hyperglycemia, without long-term current use of insulin ([place])   [place], Rebekah Nunnery, MD              Passed - Patient is not pregnant      Passed - Last BP in normal range    BP Readings from Last 1 Encounters:  04/17/22 137/81          gabapentin (NEURONTIN) 300 MG capsule [Pharmacy Med Name: gabapentin 300 mg capsule] 90 capsule 1    Sig: TAKE ONE  CAPSULE BY MOUTH DAILY AT 9PM AT BEDTIME [place]     Neurology: Anticonvulsants - gabapentin Failed - 06/29/2022  6:02 AM      Failed - Cr in normal range and within 360 days    Creat  Date Value Ref Range Status  04/26/2021 0.71 0.60 - 1.00 mg/dL Final   Creatinine, Urine  Date Value Ref Range Status  12/17/2019 50 20 - 275 mg/dL Final         Failed - Completed PHQ-2 or PHQ-9 in the last 360 days      Failed - Valid encounter within last 12 months    Recent Outpatient Visits           1 year ago Urinary frequency   [place] Rebekah Bear, NP   1 year ago Essential hypertension, benign   [place] Rebekah Bear, NP   1 year ago Routine general medical examination at [facility] health care facility   [place], Rebekah Nunnery, MD   2 years ago Type 2 diabetes mellitus with hyperglycemia,  without long-term current use of insulin ([place])   [place], Rebekah Nunnery, MD   2 years ago Type 2 diabetes mellitus with hyperglycemia, without long-term current use of insulin ([place])   [facility] [place], Rebekah Nunnery, MD

## 2022-06-30 ENCOUNTER — Other Ambulatory Visit (INDEPENDENT_AMBULATORY_CARE_PROVIDER_SITE_OTHER): Payer: Self-pay | Admitting: Gastroenterology

## 2022-07-18 ENCOUNTER — Ambulatory Visit (INDEPENDENT_AMBULATORY_CARE_PROVIDER_SITE_OTHER): Payer: Medicare Other | Admitting: Gastroenterology

## 2022-07-18 ENCOUNTER — Encounter (INDEPENDENT_AMBULATORY_CARE_PROVIDER_SITE_OTHER): Payer: Self-pay | Admitting: Gastroenterology

## 2022-07-18 VITALS — BP 143/83 | HR 87 | Temp 98.2°F | Ht 60.0 in | Wt 147.9 lb

## 2022-07-18 DIAGNOSIS — R152 Fecal urgency: Secondary | ICD-10-CM | POA: Diagnosis not present

## 2022-07-18 DIAGNOSIS — R159 Full incontinence of feces: Secondary | ICD-10-CM | POA: Diagnosis not present

## 2022-07-18 DIAGNOSIS — R195 Other fecal abnormalities: Secondary | ICD-10-CM | POA: Diagnosis not present

## 2022-07-18 DIAGNOSIS — K594 Anal spasm: Secondary | ICD-10-CM

## 2022-07-18 MED ORDER — DICYCLOMINE HCL 10 MG PO CAPS: 10.0000 mg | ORAL_CAPSULE | Freq: Two times a day (BID) | ORAL | 3 refills | Status: DC | PRN

## 2022-07-18 NOTE — Patient Instructions (Signed)
Please continue with fiber supplement Continue taking dicyclomine up to twice daily for looser stools/abdominal or rectal pain [facility] for further evaluation of your fecal incontinence  Follow up 6 months

## 2022-07-18 NOTE — Progress Notes (Addendum)
Referring Provider: Celene Squibb, MD Primary Care Physician:  Celene Squibb, MD Primary GI Physician: Jenetta Downer   Chief Complaint  Patient presents with   Diarrhea    Follow up on loose stools. Reports better than last visit 3 months ago but still has some loose stools. Happens about 3 times per week. Did see some blood when wiping after BM this week.    HPI:   Rebekah Gutierrez is a 76 y.o. female with past medical history of DM, GERD, hiatal hernia, HTN, vertigo.    Patient presenting today for follow up of diarrhea.  Last seen in August 2023, at that time having rectal pain occurring every 2-3 months, like a spasm. Loose stools since march. 2-3 per day. Taking metamucil fiber daily. Having some toilet tissue hematochezia. Recommended stopping metamucil, start benefiber 1T TID with meals, increase water intake, start bentyl '10mg'$  BID PRN, consider anorectal manometry.  Present Patient states that stools are improved since last visit. Still having some looser stools but only 1-2x/week. Other times she is having 1 formed BM per day. She states that she is taking daily fiber and doing bentyl BID with good results.  She has occasional toilet tissue hematochezia, this has occurred maybe 3 different occasions, no melena. She is still having occasional rectal pain but this has improved since her last visit. Appetite is okay, she states sometimes she does not want to eat. usually eats 2 meals per day, she consumes a lot of liquids. Weight is stable. Denies nausea or vomiting. Previously having more fecal urgency and sometimes fecal incontinence this has improved some, still having issues maybe 3x/week. Previously discussed anorectal manometry which she is now interested in pursuing.   Last Colonoscopy:Nov 2022- Two 4 to 8 mm polyps in the proximal rectum, One 3 mm polyp in the proximal transverse colon - Multiple 3 to 6 mm polyps in the rectum. - Diverticulosis in the entire examined colon. - The  distal rectum and anal verge are normal on retroflexion view.  Repeat colonoscopy in nov 2025  Past Medical History:  Diagnosis Date   Diabetes [facility])    Encounter for screening colonoscopy 06/05/2012   GERD (gastroesophageal reflux disease)    Hiatal hernia    Hypertension    Vertigo     Past Surgical History:  Procedure Laterality Date   BACK SURGERY     L4-5 PLIF   BLADDER SURGERY     CERVICAL SPINE SURGERY     fusion   CHOLECYSTECTOMY, LAPAROSCOPIC     Remote Past   COLONOSCOPY  06/20/2012   Procedure: COLONOSCOPY;  Surgeon: Rogene Houston, MD;  [facility];  Service: Endoscopy;  Laterality: N/A;  250   COLONOSCOPY WITH PROPOFOL N/A 07/26/2021   Procedure: COLONOSCOPY WITH PROPOFOL;  Surgeon: Harvel Quale, MD;  Location: [facility];  Service: Gastroenterology;  Laterality: N/A;  9:25   FOOT SURGERY     left    HAND SURGERY     left   INCISIONAL HERNIA REPAIR N/A 05/26/2020   Procedure: Jerry Caras WITH MESH;  Surgeon: Aviva Signs, MD;  Location: [facility] ORS;  Service: General;  Laterality: N/A;   POLYPECTOMY  07/26/2021   Procedure: POLYPECTOMY;  Surgeon: Harvel Quale, MD;  Location: [facility];  Service: Gastroenterology;;   TUBAL LIGATION      Current Outpatient Medications  Medication Sig Dispense Refill   acetaminophen (TYLENOL) 500 MG tablet Take 1,000 mg by mouth every  6 (six) hours as needed for moderate pain.     albuterol (VENTOLIN HFA) 108 (90 Base) MCG/ACT inhaler Inhale 2 puffs into the lungs every 6 (six) hours as needed for wheezing or shortness of breath. 8 g 0   ALPHAGAN P 0.1 % SOLN Place 1 drop into both eyes 2 (two) times daily.     amLODipine (NORVASC) 10 MG tablet Take 1 tablet (10 mg total) by mouth daily. 90 tablet 3   Apoaequorin (PREVAGEN PO) Take 1 tablet by mouth daily.     aspirin 325 MG EC tablet Take 325 mg by mouth daily.     atorvastatin (LIPITOR) 20 MG tablet Take 0.5 tablets (10 mg  total) by mouth daily. 90 tablet 3   Calcium Carb-Cholecalciferol (CALCIUM 1000 + D PO) Take 1 tablet by mouth daily.     Cinnamon 500 MG TABS Take 500 mg by mouth at bedtime.      dicyclomine (BENTYL) 10 MG capsule TAKE 1 CAPSULE BY MOUTH TWICE DAILY AS NEEDED FOR SPASMS 60 capsule 0   dorzolamide-timolol (COSOPT) 22.3-6.8 MG/ML ophthalmic solution Place 1 drop into both eyes 2 (two) times daily.     FARXIGA 10 MG TABS tablet Take 10 mg by mouth daily.     gabapentin (NEURONTIN) 300 MG capsule TAKE 1 CAPSULE BY MOUTH ONCE DAILY AT BEDTIME FOR BACK PAIN 90 capsule 3   glipiZIDE (GLUCOTROL XL) 10 MG 24 hr tablet Take 1 tablet (10 mg total) by mouth daily with breakfast. 90 tablet 3   glucose blood (ONETOUCH ULTRA) test strip USE 1 STRIP TO CHECK GLUCOSE TWICE DAILY AS DIRECTED 100 each 3   hydrochlorothiazide (HYDRODIURIL) 12.5 MG tablet Take 12.5 mg by mouth daily.     Lancets (ONETOUCH DELICA PLUS [id number]) MISC USE 1 LANCET TO CHECK GLUCOSE TWICE DAILY AS DIRECTED 100 each 3   lisinopril (ZESTRIL) 40 MG tablet Take 1 tablet (40 mg total) by mouth daily. 90 tablet 3   Methylcellulose, Laxative, (FIBER THERAPY) 500 MG TABS Take 500 mg by mouth daily.     Multiple Vitamin (MULTIVITAMIN WITH MINERALS) TABS tablet Take 1 tablet by mouth daily.     mycophenolate (CELLCEPT) 500 MG tablet Take 1,000 mg by mouth 2 (two) times daily.     Omega-3 Fatty Acids (FISH OIL) 1000 MG CAPS tAKE '1000MG'$  TWICE A DAY FOR CHOLESTEROL (Patient taking differently: Take 1,000 mg by mouth in the morning and at bedtime.)  0   Potassium 99 MG TABS Take 99 mg by mouth daily.     VYZULTA 0.024 % SOLN Place 1 drop into both eyes at bedtime.      No current facility-administered medications for this visit.    Allergies as of 07/18/2022   (No Known Allergies)    Family History  Problem Relation Age of Onset   Hypertension Mother    Stroke Mother    Hypertension Father    Cancer Father    Colon cancer Neg Hx      Social History   Socioeconomic History   Marital status: Divorced    Spouse name: Not on file   Number of children: Not on file   Years of education: Not on file   Highest education level: Not on file  Occupational History   Not on file  Tobacco Use   Smoking status: Every Day    Packs/day: 0.50    Years: 45.00    Total pack years: 22.50    Types: Cigarettes  Passive exposure: Current   Smokeless tobacco: Never   Tobacco comments:    1/2 pack a day since age 64  Substance and Sexual Activity   Alcohol use: No    Alcohol/week: 0.0 standard drinks of alcohol   Drug use: No   Sexual activity: Yes    Birth control/protection: Post-menopausal, Surgical  Other Topics Concern   Not on file  Social History Narrative   Not on file   Social Determinants of Health   Financial Resource Strain: Low Risk  (04/15/2020)   Overall Financial Resource Strain (CARDIA)    Difficulty of Paying Living Expenses: Not very hard  Food Insecurity: Not on file  Transportation Needs: Not on file  Physical Activity: Not on file  Stress: Not on file  Social Connections: Not on file   Review of systems General: negative for malaise, night sweats, fever, chills, weight loss Neck: Negative for lumps, goiter, pain and significant neck swelling Resp: Negative for cough, wheezing, dyspnea at rest CV: Negative for chest pain, leg swelling, palpitations, orthopnea GI: denies melena, hematochezia, nausea, vomiting, constipation, dysphagia, odyonophagia, early satiety or unintentional weight loss. +loose stools +fecal urgency/incontinence MSK: Negative for joint pain or swelling, back pain, and muscle pain. Derm: Negative for itching or rash Psych: Denies depression, anxiety, memory loss, confusion. No homicidal or suicidal ideation.  Heme: Negative for prolonged bleeding, bruising easily, and swollen nodes. Endocrine: Negative for cold or heat intolerance, polyuria, polydipsia and goiter. Neuro:  negative for tremor, gait imbalance, syncope and seizures. The remainder of the review of systems is noncontributory.  Physical Exam: BP (!) 143/83 (BP Location: Left Arm, Patient Position: Sitting, Cuff Size: Large)   Pulse 87   Temp 98.2 F (36.8 C) (Oral)   Ht 5' (1.524 m)   Wt 147 lb 14.4 oz (67.1 kg)   BMI 28.88 kg/m  General:   Alert and oriented. No distress noted. Pleasant and cooperative.  Head:  Normocephalic and atraumatic. Eyes:  Conjuctiva clear without scleral icterus. Mouth:  Oral mucosa pink and moist. Good dentition. No lesions. Heart: Normal rate and rhythm, s1 and s2 heart sounds present.  Lungs: Clear lung sounds in all lobes. Respirations equal and unlabored. Abdomen:  +BS, soft, non-tender and non-distended. No rebound or guarding. No HSM or masses noted. Derm: No palmar erythema or jaundice Msk:  Symmetrical without gross deformities. Normal posture. Extremities:  Without edema. Neurologic:  Alert and  oriented x4 Psych:  Alert and cooperative. Normal mood and affect.  Invalid input(s): "6 MONTHS"   ASSESSMENT: Rebekah Gutierrez is a 76 y.o. female presenting today for follow up of loose stools.  Patient continues to have intermittent loose stools, though improved to now occurring 1-2x/week with the use of daily fiber and bentyl '10mg'$  BID. Having formed stools at other times. 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 She continues to have occasional rectal pain, thought previously to be spasms, but this is improved with the use of bentyl. Fecal urgency and incontinence have also improved but still occurring about 3x/week. At last visit, rectal tone was noted to be quite hypotonic on rectal exam, we discussed pursuing anorectal manometry for further evaluation, however, patient did not wish to proceed at that time, she is interested in referral to [facility] for this now. Should continue with daily  fiber, bentyl '10mg'$  BID, will provide the low FODMAP diet and refer to [facility] for anorectal manometry.  PLAN:  Continue with daily fiber  2. Referral to [facility] for Anorectal manometry  3. Continue bentyl BID 4. Low FODMAP diet  All questions were answered, patient verbalized understanding and is in agreement with plan as outlined above.   Follow Up: 6 months   Daphney Hopke L. Alver Sorrow, MSN, APRN, AGNP-C Adult-Gerontology Nurse Practitioner [facility]  I have reviewed the note and agree with the APP's assessment as described in this progress note  Maylon Peppers, MD Gastroenterology and Hepatology [facility]

## 2022-08-01 ENCOUNTER — Other Ambulatory Visit: Payer: Self-pay | Admitting: Family Medicine

## 2022-08-01 NOTE — Telephone Encounter (Signed)
Requested medication (s) are due for refill today:yes  Requested medication (s) are on the active medication list: yes  Last refill:  06/22/21  Future visit scheduled:no  Notes to clinic:  Unable to refill per protocol, appointment needed. Routing for review.     Requested Prescriptions  Pending Prescriptions Disp Refills   lisinopril (ZESTRIL) 40 MG tablet [Pharmacy Med Name: lisinopril 40 mg tablet] 90 tablet 1    Sig: TAKE ONE TABLET BY MOUTH DAILY AT 9AM     Cardiovascular:  ACE Inhibitors Failed - 08/01/2022  2:13 PM      Failed - Cr in normal range and within 180 days    Creat  Date Value Ref Range Status  04/26/2021 0.71 0.60 - 1.00 mg/dL Final   Creatinine, Urine  Date Value Ref Range Status  12/17/2019 50 20 - 275 mg/dL Final         Failed - K in normal range and within 180 days    Potassium  Date Value Ref Range Status  04/26/2021 3.7 3.5 - 5.3 mmol/L Final         Failed - Last BP in normal range    BP Readings from Last 1 Encounters:  07/18/22 (!) 143/83         Failed - Valid encounter within last 6 months    Recent Outpatient Visits           1 year ago Urinary frequency   [place], Jessica A, NP   1 year ago Essential hypertension, benign   [place] Eulogio Bear, NP   1 year ago Routine general medical examination at a health care facility   [place], Modena Nunnery, MD   2 years ago Type 2 diabetes mellitus with hyperglycemia, without long-term current use of insulin ([place])   [place], Modena Nunnery, MD   2 years ago Type 2 diabetes mellitus with hyperglycemia, without long-term current use of insulin ([place])   [place], Modena Nunnery, MD              Passed - Patient is not pregnant       gabapentin (NEURONTIN) 300 MG capsule [Pharmacy Med Name: gabapentin 300 mg capsule] 90 capsule 1    Sig: TAKE ONE CAPSULE BY  MOUTH DAILY AT 9PM AT BEDTIME FOR BACK PAIN     Neurology: Anticonvulsants - gabapentin Failed - 08/01/2022  2:13 PM      Failed - Cr in normal range and within 360 days    Creat  Date Value Ref Range Status  04/26/2021 0.71 0.60 - 1.00 mg/dL Final   Creatinine, Urine  Date Value Ref Range Status  12/17/2019 50 20 - 275 mg/dL Final         Failed - Completed PHQ-2 or PHQ-9 in the last 360 days      Failed - Valid encounter within last 12 months    Recent Outpatient Visits           1 year ago Urinary frequency   [place] Eulogio Bear, NP   1 year ago Essential hypertension, benign   [place] Eulogio Bear, NP   1 year ago Routine general medical examination at a health care facility   [place], Modena Nunnery, MD   2 years ago Type 2 diabetes mellitus with hyperglycemia, without long-term current use  of insulin ([place])   [place], Modena Nunnery, MD   2 years ago Type 2 diabetes mellitus with hyperglycemia, without long-term current use of insulin ([place])   [facility] [place], Modena Nunnery, MD

## 2022-08-09 ENCOUNTER — Other Ambulatory Visit: Payer: Self-pay | Admitting: Nurse Practitioner

## 2022-08-09 NOTE — Telephone Encounter (Signed)
Pt no longer under prescribers care. PCP is Allyn Kenner MD Requested Prescriptions  Refused Prescriptions Disp Refills   glipiZIDE (GLUCOTROL XL) 10 MG 24 hr tablet [Pharmacy Med Name: glipizide ER 10 mg tablet, extended release 24 hr] 90 tablet 11    Sig: TAKE ONE TABLET BY MOUTH DAILY AT [place]     Endocrinology:  Diabetes - Sulfonylureas Failed - 08/09/2022 12:02 PM      Failed - HBA1C is between 0 and 7.9 and within 180 days    Hgb A1C (fingerstick)  Date Value Ref Range Status  05/27/2014 6.1 (H) <5.7 % Final    Comment:                                                                           According to the ADA Clinical Practice Recommendations for 2011, when HbA1c is used as a screening test:     >=6.5%   Diagnostic of Diabetes Mellitus            (if abnormal result is confirmed)   5.7-6.4%   Increased risk of developing Diabetes Mellitus   References:Diagnosis and Classification of Diabetes Mellitus,Diabetes HYWV,3710,62(IRSWN 1):S62-S69 and Standards of Medical Care in         Diabetes - 2011,Diabetes Care,2011,34 (Suppl 1):S11-S61.     Hgb A1c MFr Bld  Date Value Ref Range Status  04/26/2021 6.7 (H) <5.7 % of total Hgb Final    Comment:    For someone without known diabetes, a hemoglobin A1c value of 6.5% or greater indicates that they may have  diabetes and this should be confirmed with a follow-up  test. . For someone with known diabetes, a value <7% indicates  that their diabetes is well controlled and a value  greater than or equal to 7% indicates suboptimal  control. A1c targets should be individualized based on  duration of diabetes, age, comorbid conditions, and  other considerations. . Currently, no consensus exists regarding use of hemoglobin A1c for diagnosis of diabetes for children. .          Failed - Cr in normal range and within 360 days    Creat  Date Value Ref Range Status  04/26/2021 0.71 0.60 - 1.00 mg/dL Final    Creatinine, Urine  Date Value Ref Range Status  12/17/2019 50 20 - 275 mg/dL Final         Failed - Valid encounter within last 6 months    Recent Outpatient Visits           1 year ago Urinary frequency   [place], Jessica A, NP   1 year ago Essential hypertension, benign   [place] Eulogio Bear, NP   1 year ago Routine general medical examination at a health care facility   [place], Modena Nunnery, MD   2 years ago Type 2 diabetes mellitus with hyperglycemia, without long-term current use of insulin ([place])   [place], Modena Nunnery, MD   2 years ago Type 2 diabetes mellitus with hyperglycemia, without long-term current use of insulin ([place])   [facility] Medicine [place], Modena Nunnery, MD

## 2022-08-21 DIAGNOSIS — E1165 Type 2 diabetes mellitus with hyperglycemia: Secondary | ICD-10-CM | POA: Diagnosis not present

## 2022-08-21 DIAGNOSIS — I10 Essential (primary) hypertension: Secondary | ICD-10-CM | POA: Diagnosis not present

## 2022-08-25 DIAGNOSIS — H30033 Focal chorioretinal inflammation, peripheral, bilateral: Secondary | ICD-10-CM | POA: Diagnosis not present

## 2022-08-25 DIAGNOSIS — M545 Low back pain, unspecified: Secondary | ICD-10-CM | POA: Diagnosis not present

## 2022-08-25 DIAGNOSIS — H401121 Primary open-angle glaucoma, left eye, mild stage: Secondary | ICD-10-CM | POA: Diagnosis not present

## 2022-08-25 DIAGNOSIS — E1140 Type 2 diabetes mellitus with diabetic neuropathy, unspecified: Secondary | ICD-10-CM | POA: Diagnosis not present

## 2022-08-25 DIAGNOSIS — R809 Proteinuria, unspecified: Secondary | ICD-10-CM | POA: Diagnosis not present

## 2022-08-25 DIAGNOSIS — J449 Chronic obstructive pulmonary disease, unspecified: Secondary | ICD-10-CM | POA: Diagnosis not present

## 2022-08-25 DIAGNOSIS — I10 Essential (primary) hypertension: Secondary | ICD-10-CM | POA: Diagnosis not present

## 2022-08-25 DIAGNOSIS — I700 Atherosclerosis of aorta: Secondary | ICD-10-CM | POA: Diagnosis not present

## 2022-08-25 DIAGNOSIS — Z961 Presence of intraocular lens: Secondary | ICD-10-CM | POA: Diagnosis not present

## 2022-08-25 DIAGNOSIS — F17200 Nicotine dependence, unspecified, uncomplicated: Secondary | ICD-10-CM | POA: Diagnosis not present

## 2022-08-25 DIAGNOSIS — H401113 Primary open-angle glaucoma, right eye, severe stage: Secondary | ICD-10-CM | POA: Diagnosis not present

## 2022-08-25 DIAGNOSIS — J439 Emphysema, unspecified: Secondary | ICD-10-CM | POA: Diagnosis not present

## 2022-08-25 DIAGNOSIS — H3581 Retinal edema: Secondary | ICD-10-CM | POA: Diagnosis not present

## 2022-08-25 DIAGNOSIS — E785 Hyperlipidemia, unspecified: Secondary | ICD-10-CM | POA: Diagnosis not present

## 2022-08-25 DIAGNOSIS — E876 Hypokalemia: Secondary | ICD-10-CM | POA: Diagnosis not present

## 2022-08-25 DIAGNOSIS — E1165 Type 2 diabetes mellitus with hyperglycemia: Secondary | ICD-10-CM | POA: Diagnosis not present

## 2022-08-25 DIAGNOSIS — Z79899 Other long term (current) drug therapy: Secondary | ICD-10-CM | POA: Diagnosis not present

## 2022-08-25 DIAGNOSIS — H409 Unspecified glaucoma: Secondary | ICD-10-CM | POA: Diagnosis not present

## 2022-08-28 ENCOUNTER — Other Ambulatory Visit: Payer: Self-pay | Admitting: Nurse Practitioner

## 2022-08-28 ENCOUNTER — Other Ambulatory Visit: Payer: Self-pay | Admitting: Family Medicine

## 2022-09-05 ENCOUNTER — Other Ambulatory Visit: Payer: Self-pay | Admitting: Family Medicine

## 2022-09-12 DIAGNOSIS — Z79899 Other long term (current) drug therapy: Secondary | ICD-10-CM | POA: Diagnosis not present

## 2022-09-12 DIAGNOSIS — H2013 Chronic iridocyclitis, bilateral: Secondary | ICD-10-CM | POA: Diagnosis not present

## 2022-09-12 DIAGNOSIS — Z961 Presence of intraocular lens: Secondary | ICD-10-CM | POA: Diagnosis not present

## 2022-09-12 DIAGNOSIS — H30033 Focal chorioretinal inflammation, peripheral, bilateral: Secondary | ICD-10-CM | POA: Diagnosis not present

## 2022-09-12 DIAGNOSIS — Z1159 Encounter for screening for other viral diseases: Secondary | ICD-10-CM | POA: Diagnosis not present

## 2022-09-12 DIAGNOSIS — H40022 Open angle with borderline findings, high risk, left eye: Secondary | ICD-10-CM | POA: Diagnosis not present

## 2022-09-12 DIAGNOSIS — H401113 Primary open-angle glaucoma, right eye, severe stage: Secondary | ICD-10-CM | POA: Diagnosis not present

## 2022-09-14 ENCOUNTER — Other Ambulatory Visit (HOSPITAL_COMMUNITY): Payer: Self-pay | Admitting: Internal Medicine

## 2022-09-14 DIAGNOSIS — Z1231 Encounter for screening mammogram for malignant neoplasm of breast: Secondary | ICD-10-CM

## 2022-09-21 ENCOUNTER — Ambulatory Visit (HOSPITAL_COMMUNITY)
Admission: RE | Admit: 2022-09-21 | Discharge: 2022-09-21 | Disposition: A | Discharge status: Home / Self Care | Payer: Medicare Other | Source: Ambulatory Visit | Attending: Internal Medicine | Admitting: Internal Medicine

## 2022-09-21 DIAGNOSIS — Z1231 Encounter for screening mammogram for malignant neoplasm of breast: Secondary | ICD-10-CM | POA: Insufficient documentation

## 2022-09-22 DIAGNOSIS — M545 Low back pain, unspecified: Secondary | ICD-10-CM | POA: Diagnosis not present

## 2022-09-26 DIAGNOSIS — M545 Low back pain, unspecified: Secondary | ICD-10-CM | POA: Diagnosis not present

## 2022-09-27 ENCOUNTER — Other Ambulatory Visit: Payer: Self-pay | Admitting: Family Medicine

## 2022-09-27 NOTE — Telephone Encounter (Signed)
No longer under provider care Requested Prescriptions  Pending Prescriptions Disp Refills   atorvastatin (LIPITOR) 20 MG tablet [Pharmacy Med Name: atorvastatin 20 mg tablet] 296 tablet 0    Sig: TAKE 1/2 TABLET BY MOUTH DAILY AT 5PM     Cardiovascular:  Antilipid - Statins Failed - 09/27/2022  5:36 AM      Failed - Valid encounter within last 12 months    Recent Outpatient Visits           1 year ago Urinary frequency   [place], Jessica A, NP   1 year ago Essential hypertension, benign   [place] Eulogio Bear, NP   2 years ago Routine general medical examination at a health care facility   [place], Lonell Grandchild F, MD   2 years ago Type 2 diabetes mellitus with hyperglycemia, without long-term current use of insulin ([place])   [place], Modena Nunnery, MD   2 years ago Type 2 diabetes mellitus with hyperglycemia, without long-term current use of insulin ([place])   [place], Modena Nunnery, MD              Failed - Lipid Panel in normal range within the last 12 months    Cholesterol  Date Value Ref Range Status  04/26/2021 84 <200 mg/dL Final   LDL Cholesterol (Calc)  Date Value Ref Range Status  04/26/2021 30 mg/dL (calc) Final    Comment:    Reference range: <100 . Desirable range <100 mg/dL for primary prevention;   <70 mg/dL for patients with CHD or diabetic patients  with > or = 2 CHD risk factors. Marland Kitchen LDL-C is now calculated using the Martin-Hopkins  calculation, which is a validated novel method providing  better accuracy than the Friedewald equation in the  estimation of LDL-C.  Cresenciano Genre et al. Annamaria Helling. 5625;638(93): 2061-2068  (http://education.[facility].com/faq/FAQ164)    HDL  Date Value Ref Range Status  04/26/2021 31 (L) > OR = 50 mg/dL Final   Triglycerides  Date Value Ref Range Status  04/26/2021 157 (H) <150 mg/dL Final          Passed - Patient is not pregnant

## 2022-09-29 DIAGNOSIS — M545 Low back pain, unspecified: Secondary | ICD-10-CM | POA: Diagnosis not present

## 2022-10-16 DIAGNOSIS — K5902 Outlet dysfunction constipation: Secondary | ICD-10-CM | POA: Diagnosis not present

## 2022-10-19 DIAGNOSIS — E1165 Type 2 diabetes mellitus with hyperglycemia: Secondary | ICD-10-CM | POA: Diagnosis not present

## 2022-10-19 DIAGNOSIS — I10 Essential (primary) hypertension: Secondary | ICD-10-CM | POA: Diagnosis not present

## 2022-10-26 ENCOUNTER — Telehealth (INDEPENDENT_AMBULATORY_CARE_PROVIDER_SITE_OTHER): Payer: Self-pay | Admitting: Gastroenterology

## 2022-10-26 DIAGNOSIS — E1142 Type 2 diabetes mellitus with diabetic polyneuropathy: Secondary | ICD-10-CM | POA: Diagnosis not present

## 2022-10-26 DIAGNOSIS — E1121 Type 2 diabetes mellitus with diabetic nephropathy: Secondary | ICD-10-CM | POA: Diagnosis not present

## 2022-10-26 DIAGNOSIS — E1140 Type 2 diabetes mellitus with diabetic neuropathy, unspecified: Secondary | ICD-10-CM | POA: Diagnosis not present

## 2022-10-26 DIAGNOSIS — I10 Essential (primary) hypertension: Secondary | ICD-10-CM | POA: Diagnosis not present

## 2022-10-26 DIAGNOSIS — R809 Proteinuria, unspecified: Secondary | ICD-10-CM | POA: Diagnosis not present

## 2022-10-26 DIAGNOSIS — E876 Hypokalemia: Secondary | ICD-10-CM | POA: Diagnosis not present

## 2022-10-26 NOTE — Telephone Encounter (Signed)
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  Unfortunately, the patient did not pick up my call and I left a detailed voice message with the findings.  She will benefit from an evaluation by pelvic floor therapist and/for biofeedback therapy at [facility].  I asked the patient to give [place] a call back if she was interested in having this evaluation at their facility.  Rebekah Gutierrez she may call back, if agreeable to proceed with therapy please send the referral.

## 2022-10-27 ENCOUNTER — Other Ambulatory Visit: Payer: Self-pay | Admitting: Family Medicine

## 2022-10-27 DIAGNOSIS — M5416 Radiculopathy, lumbar region: Secondary | ICD-10-CM | POA: Diagnosis not present

## 2022-11-09 IMAGING — MG MM DIGITAL SCREENING BILAT W/ TOMO AND CAD
6 of 10 series · 6 of 30 positions shown · non-contrast
Comparison: Previous exam(s).

CLINICAL DATA: Screening.

EXAM:
DIGITAL SCREENING BILATERAL MAMMOGRAM WITH TOMOSYNTHESIS AND CAD
TECHNIQUE: Bilateral screening digital craniocaudal and mediolateral oblique
mammograms were obtained. Bilateral screening digital breast
tomosynthesis was performed. The images were evaluated with
computer-aided detection.

[L CC synth-2D]
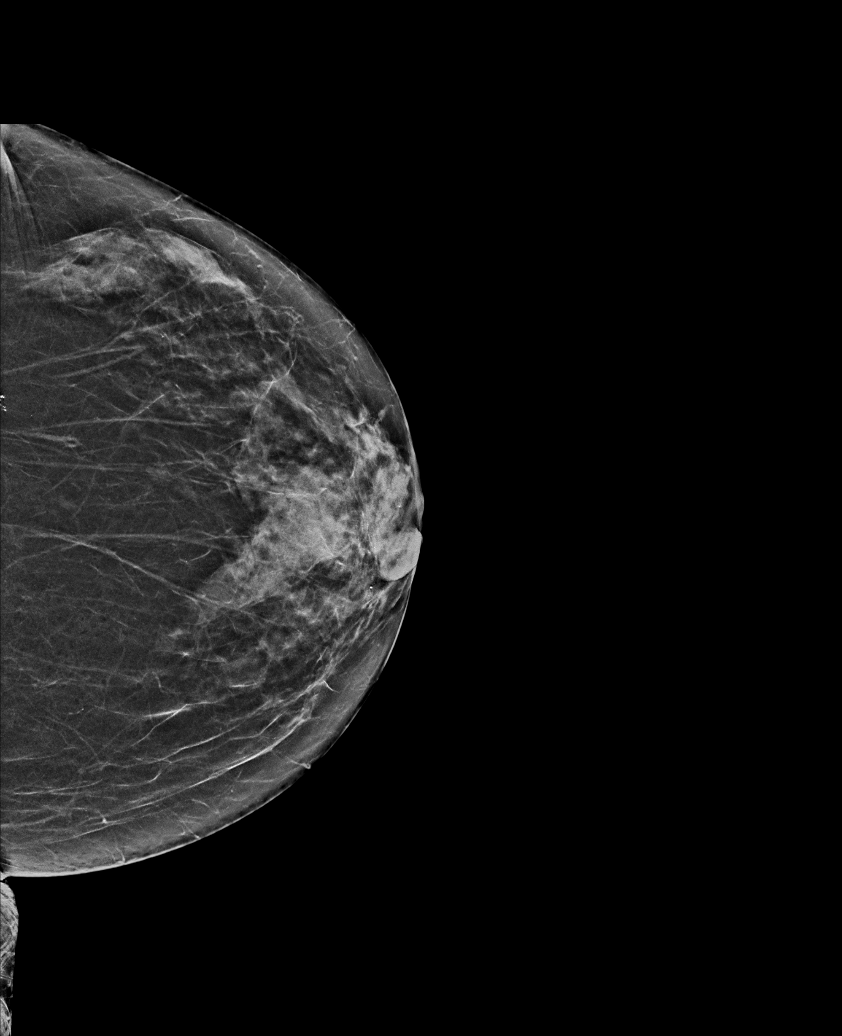

[L MLO synth-2D (1 of 2)]
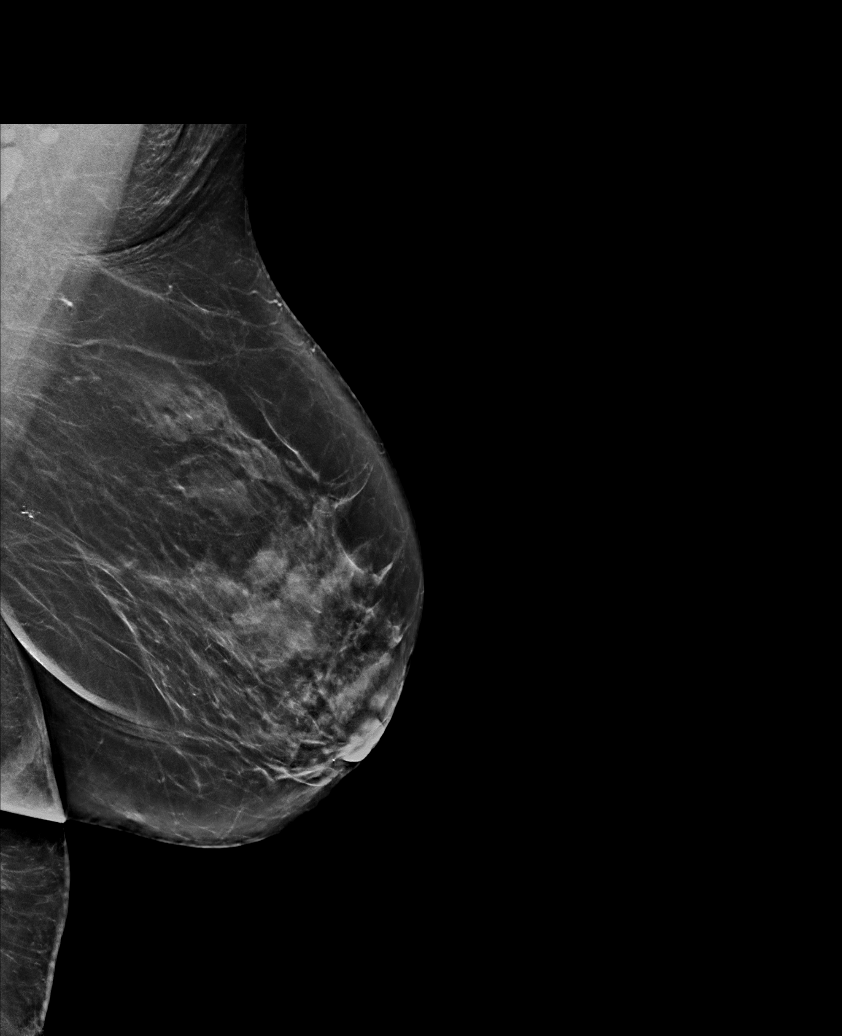

[L MLO synth-2D (2 of 2)]
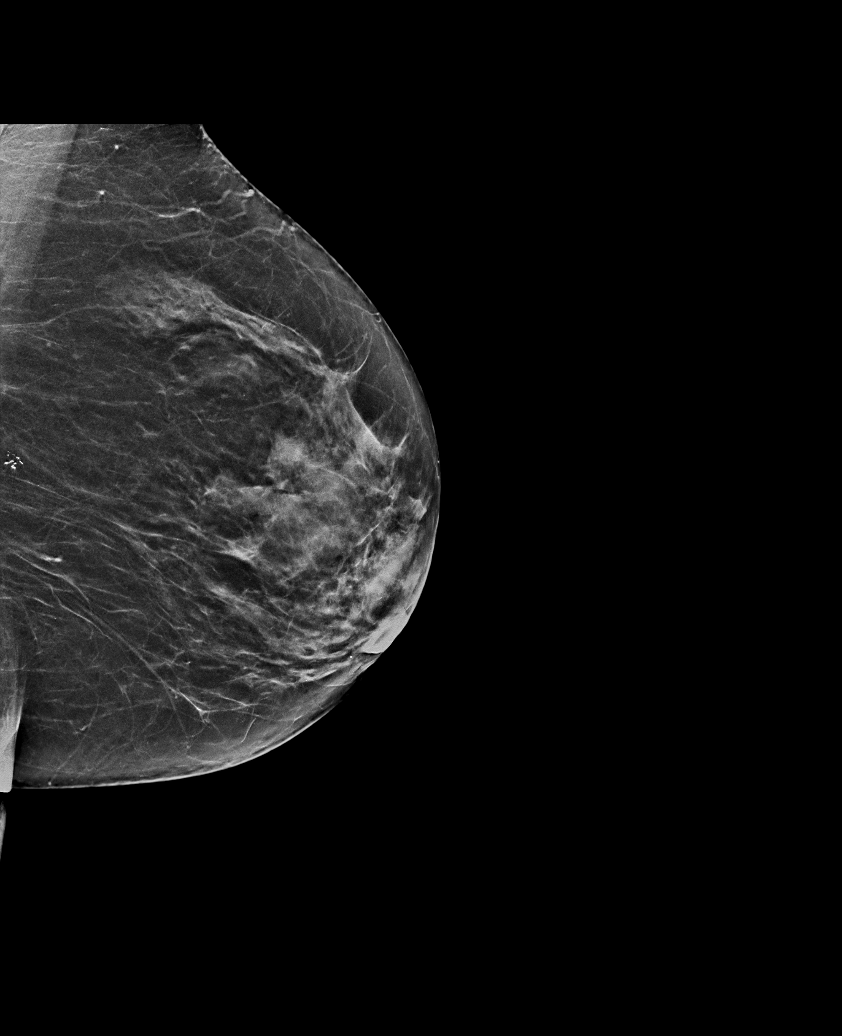

[R CC synth-2D]
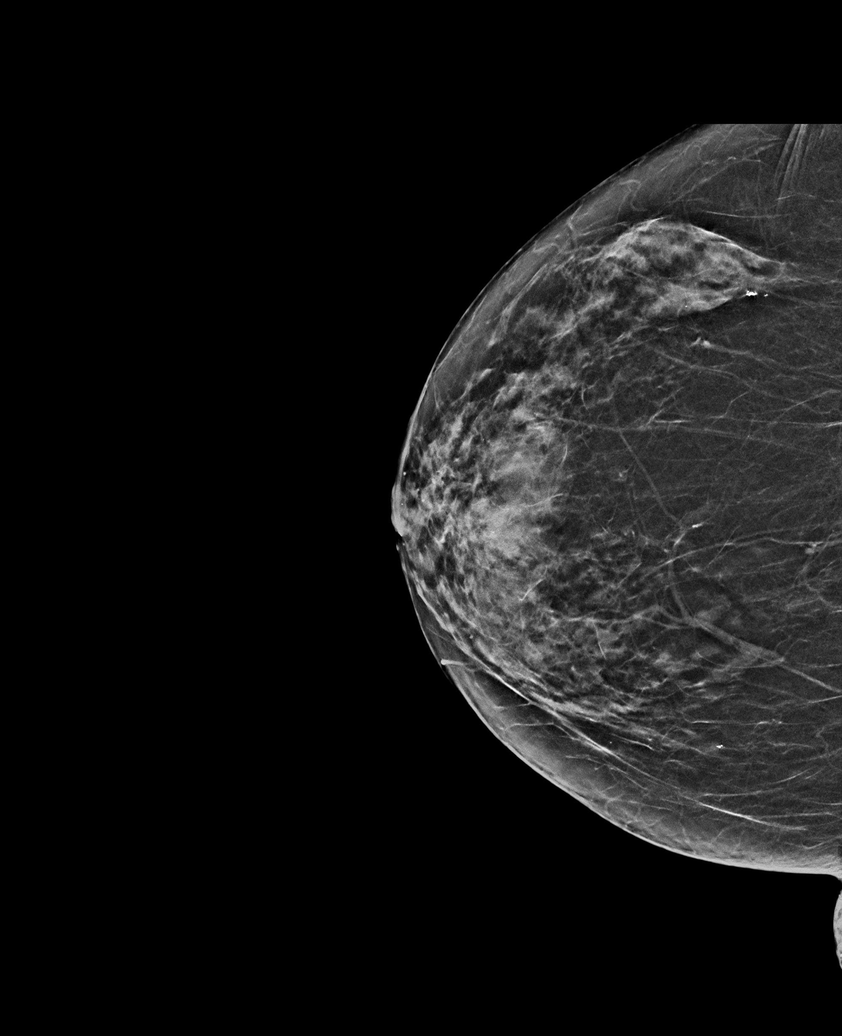

[R MLO synth-2D]
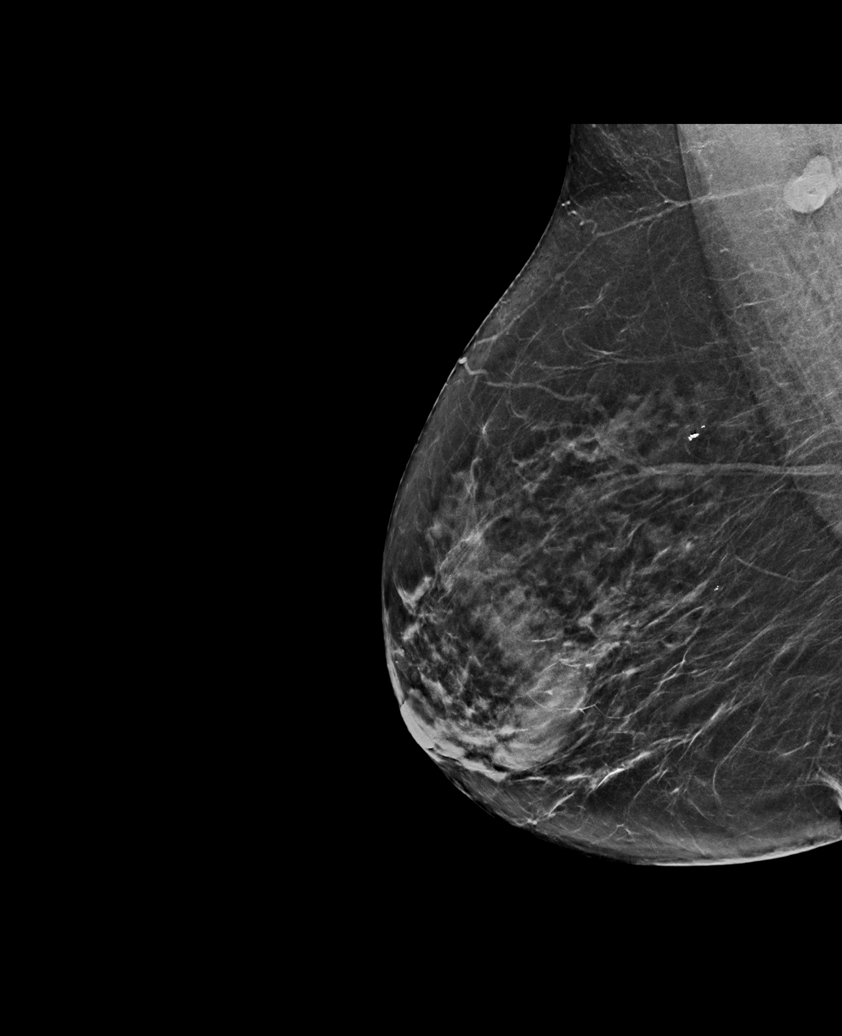

[L CC tomo · tomo slice 27/52.0]
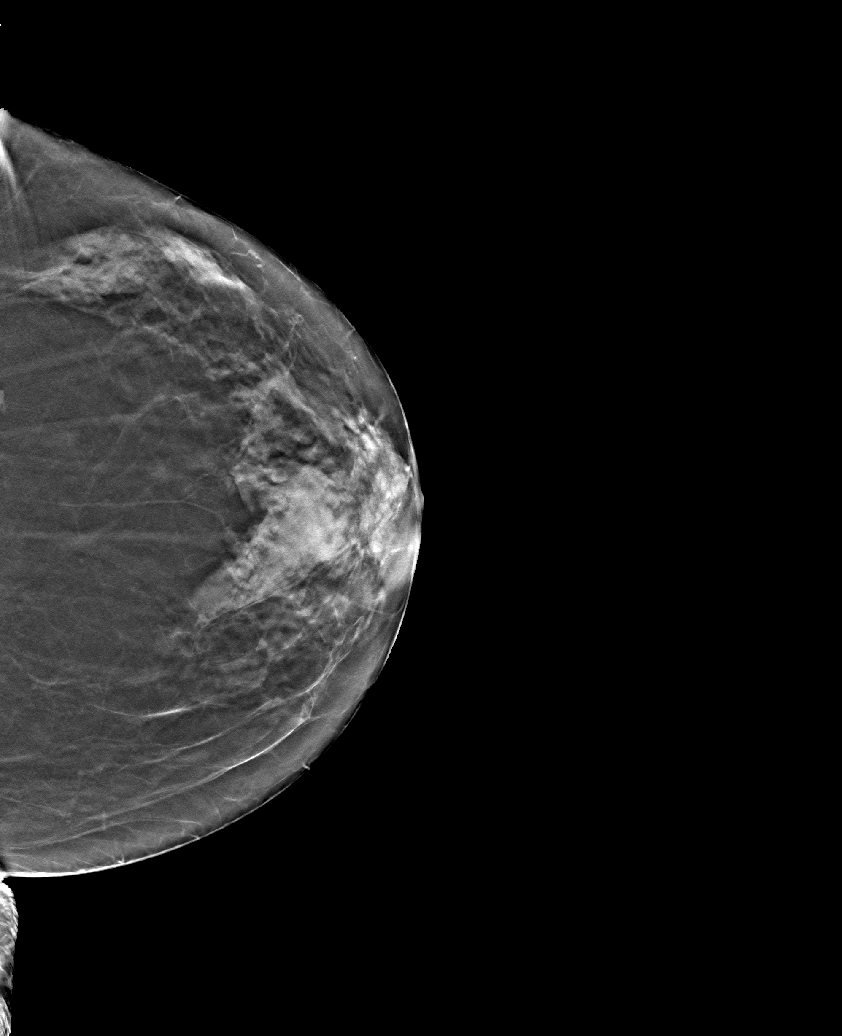

[6 of 30 positions shown; findings below may reference images not displayed]

ACR Breast Density Category c: The breast tissue is heterogeneously
dense, which may obscure small masses.
FINDINGS: There are no findings suspicious for malignancy. The images were
evaluated with computer-aided detection.
IMPRESSION: No mammographic evidence of malignancy. A result letter of this
screening mammogram will be mailed directly to the patient.

RECOMMENDATION:
Screening mammogram in one year. (Code:T4-5-GWO)

BI-RADS CATEGORY  1: Negative.

## 2022-11-18 ENCOUNTER — Other Ambulatory Visit (INDEPENDENT_AMBULATORY_CARE_PROVIDER_SITE_OTHER): Payer: Self-pay | Admitting: Gastroenterology

## 2022-11-21 DIAGNOSIS — H401113 Primary open-angle glaucoma, right eye, severe stage: Secondary | ICD-10-CM | POA: Diagnosis not present

## 2022-11-21 DIAGNOSIS — Z79899 Other long term (current) drug therapy: Secondary | ICD-10-CM | POA: Diagnosis not present

## 2022-11-21 DIAGNOSIS — H3581 Retinal edema: Secondary | ICD-10-CM | POA: Diagnosis not present

## 2022-11-21 DIAGNOSIS — H401121 Primary open-angle glaucoma, left eye, mild stage: Secondary | ICD-10-CM | POA: Diagnosis not present

## 2022-11-21 DIAGNOSIS — M5416 Radiculopathy, lumbar region: Secondary | ICD-10-CM | POA: Diagnosis not present

## 2022-11-21 DIAGNOSIS — H30033 Focal chorioretinal inflammation, peripheral, bilateral: Secondary | ICD-10-CM | POA: Diagnosis not present

## 2022-11-21 DIAGNOSIS — Z961 Presence of intraocular lens: Secondary | ICD-10-CM | POA: Diagnosis not present

## 2022-11-23 DIAGNOSIS — E1140 Type 2 diabetes mellitus with diabetic neuropathy, unspecified: Secondary | ICD-10-CM | POA: Diagnosis not present

## 2022-11-23 DIAGNOSIS — L110 Acquired keratosis follicularis: Secondary | ICD-10-CM | POA: Diagnosis not present

## 2022-11-23 DIAGNOSIS — M79675 Pain in left toe(s): Secondary | ICD-10-CM | POA: Diagnosis not present

## 2022-11-23 DIAGNOSIS — M79672 Pain in left foot: Secondary | ICD-10-CM | POA: Diagnosis not present

## 2022-11-23 DIAGNOSIS — I739 Peripheral vascular disease, unspecified: Secondary | ICD-10-CM | POA: Diagnosis not present

## 2022-11-23 DIAGNOSIS — M79671 Pain in right foot: Secondary | ICD-10-CM | POA: Diagnosis not present

## 2022-11-23 DIAGNOSIS — M79674 Pain in right toe(s): Secondary | ICD-10-CM | POA: Diagnosis not present

## 2022-11-27 ENCOUNTER — Other Ambulatory Visit: Payer: Self-pay | Admitting: Family Medicine

## 2022-12-18 ENCOUNTER — Other Ambulatory Visit (INDEPENDENT_AMBULATORY_CARE_PROVIDER_SITE_OTHER): Payer: Self-pay | Admitting: Gastroenterology

## 2022-12-26 ENCOUNTER — Other Ambulatory Visit: Payer: Self-pay | Admitting: Family Medicine

## 2023-01-02 ENCOUNTER — Other Ambulatory Visit: Payer: Self-pay | Admitting: Family Medicine

## 2023-01-02 NOTE — Telephone Encounter (Signed)
Call to patient- left message : Pharmacy sending RF to wrong office- please notify them so patient does not go without Requested Prescriptions  Pending Prescriptions Disp Refills   atorvastatin (LIPITOR) 20 MG tablet [Pharmacy Med Name: atorvastatin 20 mg tablet] 296 tablet 0    Sig: TAKE 1/2 TABLET BY MOUTH DAILY AT 5PM     Cardiovascular:  Antilipid - Statins Failed - 01/02/2023  5:45 AM      Failed - Valid encounter within last 12 months    Recent Outpatient Visits           1 year ago Urinary frequency   [facility] Valentino Nose, NP   2 years ago Essential hypertension, benign   [facility] Valentino Nose, NP   2 years ago Routine general medical examination at a health care facility   [facility] [place], Kingsley Spittle F, MD   2 years ago Type 2 diabetes mellitus with hyperglycemia, without long-term current use of insulin (HCC)   [facility] [place], Velna Hatchet, MD   3 years ago Type 2 diabetes mellitus with hyperglycemia, without long-term current use of insulin (HCC)   [facility] [place], Velna Hatchet, MD              Failed - Lipid Panel in normal range within the last 12 months    Cholesterol  Date Value Ref Range Status  04/26/2021 84 <200 mg/dL Final   LDL Cholesterol (Calc)  Date Value Ref Range Status  04/26/2021 30 mg/dL (calc) Final    Comment:    Reference range: <100 . Desirable range <100 mg/dL for primary prevention;   <70 mg/dL for patients with CHD or diabetic patients  with > or = 2 CHD risk factors. Marland Kitchen LDL-C is now calculated using the Martin-Hopkins  calculation, which is a validated novel method providing  better accuracy than the Friedewald equation in the  estimation of LDL-C.  Horald Pollen et al. Lenox Ahr. 1610;960(45): 2061-2068  (http://education.[facility].com/faq/FAQ164)    HDL  Date Value Ref Range Status  04/26/2021 31 (L) > OR = 50 mg/dL Final    Triglycerides  Date Value Ref Range Status  04/26/2021 157 (H) <150 mg/dL Final         Passed - Patient is not pregnant

## 2023-01-16 ENCOUNTER — Telehealth (INDEPENDENT_AMBULATORY_CARE_PROVIDER_SITE_OTHER): Payer: Self-pay | Admitting: Gastroenterology

## 2023-01-16 ENCOUNTER — Encounter (INDEPENDENT_AMBULATORY_CARE_PROVIDER_SITE_OTHER): Payer: Self-pay | Admitting: Gastroenterology

## 2023-01-16 ENCOUNTER — Ambulatory Visit (INDEPENDENT_AMBULATORY_CARE_PROVIDER_SITE_OTHER): Payer: 59 | Admitting: Gastroenterology

## 2023-01-16 VITALS — BP 135/82 | HR 80 | Temp 97.8°F | Ht 60.0 in | Wt 149.3 lb

## 2023-01-16 DIAGNOSIS — R159 Full incontinence of feces: Secondary | ICD-10-CM | POA: Diagnosis not present

## 2023-01-16 DIAGNOSIS — M6289 Other specified disorders of muscle: Secondary | ICD-10-CM | POA: Diagnosis not present

## 2023-01-16 DIAGNOSIS — R197 Diarrhea, unspecified: Secondary | ICD-10-CM

## 2023-01-16 DIAGNOSIS — R152 Fecal urgency: Secondary | ICD-10-CM | POA: Diagnosis not present

## 2023-01-16 NOTE — Telephone Encounter (Signed)
Left message for receptionist to give me a call to see what I need to do to refer patient to Eulis Foster, PT

## 2023-01-16 NOTE — Progress Notes (Addendum)
Referring Provider: Benita Stabile, MD Primary Care Physician:  Benita Stabile, MD Primary GI Physician: Levon Hedger   Chief Complaint  Patient presents with   Diarrhea    Patient here today due to issues with diarrhea. Patient says this has been an ongoing issues since fall of last year.  Patient says she was started on bentyl and it has not helped that much.   HPI:   Rebekah Gutierrez is a 77 y.o. female with past medical history of  DM, GERD, hiatal hernia, HTN, vertigo.    Patient presenting today for follow up of diarrhea   Last seen November 2023, at that time stools somewhat improved. Still having some looser stools but only 1-2x/week. Other times she is having 1 formed BM per day.  taking daily fiber and doing bentyl BID with good results.  occasional toilet tissue hematochezia, this has occurred maybe 3 different occasions, no melena. still having occasional rectal pain but this has improved since her last visit. usually eats 2 meals per day, she consumes a lot of liquids. Previously having more fecal urgency and sometimes fecal incontinence this has improved some, still having issues maybe 3x/week. Previously discussed anorectal manometry which she is now interested in pursuing.   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 Patient was left VM regarding recommendations for pelvic floor therapy/biofeedback therapy but she did not return the call.   Present:  Patient states that she got the message from Dr. Levon Hedger but she did not quite understand what the results meant regarding anorectal manometry. She stated that a friend told her about a therapist in [place] that does pelvic floor therapy though she does not have contact info for them. She does not have a  way back and forth to [facility] to do therapy there. Over the past month, having more diarrhea. No recent changes in medications or antibiotic therapy. She is having to change her depends 2-3x/day, usually more in the mornings, occasionally over night due to leakage of stool. She is having mostly watery to loose stools now. She notes some mucus in her stool recently. She does not have to strain to defecate. She is not doing fiber anymore, she is unsure why she stopped it but has not used it in a few months. She is taking dicyclomine 1-2x/day. Denies abdominal pain. She notes some toilet tissue hematochezia more recently usually though noting she is having more frequent BMs and feels her bottom is raw, she is using vaseline to help soothe the area. No nausea or vomiting. Weight is stable.   Last Colonoscopy:Nov 2022- Two 4 to 8 mm polyps in the proximal rectum, One 3 mm polyp in the proximal transverse colon - Multiple 3 to 6 mm polyps in the rectum. - Diverticulosis in the entire examined colon. - The distal rectum and anal verge are normal on retroflexion view.   Repeat colonoscopy in nov 2025   Past Medical History:  Diagnosis Date   Diabetes [facility])    Encounter for screening colonoscopy 06/05/2012   GERD (gastroesophageal reflux disease)    Hiatal hernia    Hypertension    Vertigo     Past Surgical History:  Procedure Laterality Date   BACK SURGERY     L4-5 PLIF   BLADDER SURGERY     CERVICAL  SPINE SURGERY     fusion   CHOLECYSTECTOMY, LAPAROSCOPIC     Remote Past   COLONOSCOPY  06/20/2012   Procedure: COLONOSCOPY;  Surgeon: Malissa Hippo, MD;  [facility];  Service: Endoscopy;  Laterality: N/A;  250   COLONOSCOPY WITH PROPOFOL N/A 07/26/2021   Procedure: COLONOSCOPY WITH PROPOFOL;  Surgeon: Dolores Frame, MD;  Location[facility];  Service: Gastroenterology;  Laterality: N/A;  9:25   FOOT SURGERY     left    HAND SURGERY     left   INCISIONAL HERNIA  REPAIR N/A 05/26/2020   Procedure: Mena Pauls WITH MESH;  Surgeon: Franky Macho, MD;  Location: [facility];  Service: General;  Laterality: N/A;   POLYPECTOMY  07/26/2021   Procedure: POLYPECTOMY;  Surgeon: Marguerita Merles, Reuel Boom, MD;  Location[facility];  Service: Gastroenterology;;   TUBAL LIGATION      Current Outpatient Medications  Medication Sig Dispense Refill   acetaminophen (TYLENOL) 500 MG tablet Take 1,000 mg by mouth every 6 (six) hours as needed for moderate pain.     albuterol (VENTOLIN HFA) 108 (90 Base) MCG/ACT inhaler Inhale 2 puffs into the lungs every 6 (six) hours as needed for wheezing or shortness of breath. 8 g 0   ALPHAGAN P 0.1 % SOLN Place 1 drop into both eyes 2 (two) times daily.     amLODipine (NORVASC) 10 MG tablet Take 1 tablet (10 mg total) by mouth daily. 90 tablet 3   Apoaequorin (PREVAGEN PO) Take 1 tablet by mouth daily.     aspirin 325 MG EC tablet Take 325 mg by mouth daily.     atorvastatin (LIPITOR) 20 MG tablet Take 0.5 tablets (10 mg total) by mouth daily. 90 tablet 3   Calcium Carb-Cholecalciferol (CALCIUM 1000 + D PO) Take 1 tablet by mouth daily.     Cinnamon 500 MG TABS Take 500 mg by mouth at bedtime.      dicyclomine (BENTYL) 10 MG capsule TAKE 1 CAPSULE BY MOUTH TWICE DAILY AS NEEDED FOR SPASMS 60 capsule 0   dorzolamide-timolol (COSOPT) 22.3-6.8 MG/ML ophthalmic solution Place 1 drop into both eyes 2 (two) times daily.     FARXIGA 10 MG TABS tablet Take 10 mg by mouth daily.     gabapentin (NEURONTIN) 300 MG capsule TAKE 1 CAPSULE BY MOUTH ONCE DAILY AT BEDTIME FOR BACK PAIN 90 capsule 3   glipiZIDE (GLUCOTROL XL) 10 MG 24 hr tablet Take 1 tablet (10 mg total) by mouth daily with breakfast. 90 tablet 3   glucose blood (ONETOUCH ULTRA) test strip USE 1 STRIP TO CHECK GLUCOSE TWICE DAILY AS DIRECTED 100 each 3   hydrochlorothiazide (HYDRODIURIL) 12.5 MG tablet Take 12.5 mg by mouth daily.     Lancets (ONETOUCH DELICA PLUS  LANCET33G) MISC USE 1 LANCET TO CHECK GLUCOSE TWICE DAILY AS DIRECTED 100 each 3   lisinopril (ZESTRIL) 40 MG tablet Take 1 tablet (40 mg total) by mouth daily. 90 tablet 3   Methylcellulose, Laxative, (FIBER THERAPY) 500 MG TABS Take 500 mg by mouth daily.     Multiple Vitamin (MULTIVITAMIN WITH MINERALS) TABS tablet Take 1 tablet by mouth daily.     mycophenolate (CELLCEPT) 500 MG tablet Take 1,000 mg by mouth 2 (two) times daily.     Omega-3 Fatty Acids (FISH OIL) 1000 MG CAPS tAKE 1000MG  TWICE A DAY FOR CHOLESTEROL (Patient taking differently: Take 1,000 mg by mouth in the morning and at bedtime.)  0   Potassium 99 MG TABS Take 99 mg by mouth daily.     VYZULTA 0.024 % SOLN Place 1 drop into both eyes at bedtime.      No current facility-administered medications for this visit.    Allergies as of 01/16/2023   (No Known Allergies)    Family History  Problem Relation Age of Onset   Hypertension Mother    Stroke Mother    Hypertension Father    Cancer Father    Colon cancer Neg Hx     Social History   Socioeconomic History   Marital status: Divorced    Spouse name: Not on file   Number of children: Not on file   Years of education: Not on file   Highest education level: Not on file  Occupational History   Not on file  Tobacco Use   Smoking status: Every Day    Packs/day: 0.50    Years: 45.00    Additional pack years: 0.00    Total pack years: 22.50    Types: Cigarettes    Passive exposure: Current   Smokeless tobacco: Never   Tobacco comments:    1/2 pack a day since age 39  Vaping Use   Vaping Use: Never used  Substance and Sexual Activity   Alcohol use: No    Alcohol/week: 0.0 standard drinks of alcohol   Drug use: No   Sexual activity: Yes    Birth control/protection: Post-menopausal, Surgical  Other Topics Concern   Not on file  Social History Narrative   Not on file   Social Determinants of Health   Financial Resource Strain: Low Risk  (04/15/2020)    Overall Financial Resource Strain (CARDIA)    Difficulty of Paying Living Expenses: Not very hard  Food Insecurity: Not on file  Transportation Needs: Not on file  Physical Activity: Not on file  Stress: Not on file  Social Connections: Not on file   Review of systems General: negative for malaise, night sweats, fever, chills, weight los Neck: Negative for lumps, goiter, pain and significant neck swelling Resp: Negative for cough, wheezing, dyspnea at rest CV: Negative for chest pain, leg swelling, palpitations, orthopnea GI: denies melena, nausea, vomiting, constipation, dysphagia, odyonophagia, early satiety or unintentional weight loss. +diarrhea +fecal soiling +toilet tissue hematochezia  MSK: Negative for joint pain or swelling, back pain, and muscle pain. Derm: Negative for itching or rash Psych: Denies depression, anxiety, memory loss, confusion. No homicidal or suicidal ideation.  Heme: Negative for prolonged bleeding, bruising easily, and swollen nodes. Endocrine: Negative for cold or heat intolerance, polyuria, polydipsia and goiter. Neuro: negative for tremor, gait imbalance, syncope and seizures. The remainder of the review of systems is noncontributory.  Physical Exam: BP 135/82 (BP Location: Left Arm, Patient Position: Sitting, Cuff Size: Large)   Pulse 80   Temp 97.8 F (36.6 C) (Temporal)   Ht 5' (1.524 m)   Wt 149 lb 4.8 oz (67.7 kg)   BMI 29.16 kg/m  General:   Alert and oriented. No distress noted. Pleasant and cooperative.  Head:  Normocephalic and atraumatic. Eyes:  Conjuctiva clear without scleral icterus. Mouth:  Oral mucosa pink and moist. Good dentition. No lesions. Heart: Normal rate and rhythm, s1 and s2 heart sounds present.  Lungs: Clear lung sounds in all lobes. Respirations equal and unlabored. Abdomen:  +BS, soft, non-tender and non-distended. No rebound or guarding. No HSM or masses noted. Derm: No palmar erythema or jaundice Msk:  Symmetrical  without gross deformities. Normal posture. Extremities:  Without edema. Neurologic:  Alert and  oriented x4 Psych:  Alert and cooperative. Normal mood and affect.  Invalid input(s): "6 MONTHS"   ASSESSMENT: Rebekah Gutierrez is a 77 y.o. 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 It appears that there is pelvic floor/biofeedback therapy available within [facility], will try to refer her to therapy here as this will be closer for her.   She is notably having more diarrhea now with watery stools and some mucus. Also with some toilet tissue hematochezia in setting of multiple BMs and feeling that her rectal area is raw. No med changes or antibiotics, she did stop doing fiber which she previously had results with. Given change in stools with watery diarrhea and mucus, will check stool studies to rule out superimposed infectious process. Recommend restarting fiber supplement 1T TID with meals. She can continue with dicyclomine 10mg  BID PRN for now.    PLAN:  GI pathogen panel/ C diff testing  2. Resume benefiber 1T TID with meals  3. Continue bentyl 10mg  BID PRN 4. Referral to Pelvic floor therapist within [facility]   All questions were answered, patient verbalized understanding and is in agreement with plan as outlined above.   Follow Up: 3 months   Elliona Doddridge L. Jeanmarie Hubert, MSN, APRN, AGNP-C Adult-Gerontology Nurse Practitioner [facility]  I have reviewed the note and agree with the APP's assessment as described in this progress note  Katrinka Blazing, MD Gastroenterology and Hepatology [facility] Gastroenterology

## 2023-01-16 NOTE — Telephone Encounter (Signed)
Rec'd call back from outpatient rehab [facility], referral has been placed in epic, they will contact patient to schedule

## 2023-01-16 NOTE — Patient Instructions (Addendum)
We will check stool studies to rule out underlying infection since you are having more diarrhea now Please resume benefiber 1T three times daily with a meal I will look into other pelvic floor therapists to see if there are any closer ones than [facility] Continue bentyl 10mg  twice daily as needed  Follow up 3 months   It was a pleasure to see you today. I want to create trusting relationships with patients and provide genuine, compassionate, and quality care. I truly value your feedback! please be on the lookout for a survey regarding your visit with me today. I appreciate your input about our visit and your time in completing this!    Ivette Castronova L. Jeanmarie Hubert, MSN, APRN, AGNP-C Adult-Gerontology Nurse Practitioner [facility] Gastroenterology at [facility]

## 2023-01-17 LAB — GI PROFILE, STOOL, PCR

## 2023-01-19 DIAGNOSIS — H2013 Chronic iridocyclitis, bilateral: Secondary | ICD-10-CM | POA: Diagnosis not present

## 2023-01-19 DIAGNOSIS — Z961 Presence of intraocular lens: Secondary | ICD-10-CM | POA: Diagnosis not present

## 2023-01-19 DIAGNOSIS — H40022 Open angle with borderline findings, high risk, left eye: Secondary | ICD-10-CM | POA: Diagnosis not present

## 2023-01-19 DIAGNOSIS — H401113 Primary open-angle glaucoma, right eye, severe stage: Secondary | ICD-10-CM | POA: Diagnosis not present

## 2023-01-20 LAB — C DIFFICILE, CYTOTOXIN B

## 2023-01-20 LAB — C DIFFICILE TOXINS A+B W/RFLX: C difficile Toxins A+B, EIA: NEGATIVE

## 2023-01-22 ENCOUNTER — Other Ambulatory Visit (INDEPENDENT_AMBULATORY_CARE_PROVIDER_SITE_OTHER): Payer: Self-pay | Admitting: Gastroenterology

## 2023-01-24 ENCOUNTER — Encounter (INDEPENDENT_AMBULATORY_CARE_PROVIDER_SITE_OTHER): Payer: Self-pay | Admitting: *Deleted

## 2023-01-31 DIAGNOSIS — I10 Essential (primary) hypertension: Secondary | ICD-10-CM | POA: Diagnosis not present

## 2023-01-31 DIAGNOSIS — E1121 Type 2 diabetes mellitus with diabetic nephropathy: Secondary | ICD-10-CM | POA: Diagnosis not present

## 2023-02-06 ENCOUNTER — Other Ambulatory Visit (HOSPITAL_COMMUNITY): Payer: Self-pay | Admitting: Family Medicine

## 2023-02-06 DIAGNOSIS — I700 Atherosclerosis of aorta: Secondary | ICD-10-CM | POA: Diagnosis not present

## 2023-02-06 DIAGNOSIS — E1142 Type 2 diabetes mellitus with diabetic polyneuropathy: Secondary | ICD-10-CM | POA: Diagnosis not present

## 2023-02-06 DIAGNOSIS — E785 Hyperlipidemia, unspecified: Secondary | ICD-10-CM | POA: Diagnosis not present

## 2023-02-06 DIAGNOSIS — I10 Essential (primary) hypertension: Secondary | ICD-10-CM | POA: Diagnosis not present

## 2023-02-06 DIAGNOSIS — Z23 Encounter for immunization: Secondary | ICD-10-CM | POA: Diagnosis not present

## 2023-02-06 DIAGNOSIS — G4700 Insomnia, unspecified: Secondary | ICD-10-CM | POA: Diagnosis not present

## 2023-02-06 DIAGNOSIS — Z13820 Encounter for screening for osteoporosis: Secondary | ICD-10-CM

## 2023-02-06 DIAGNOSIS — E1165 Type 2 diabetes mellitus with hyperglycemia: Secondary | ICD-10-CM | POA: Diagnosis not present

## 2023-02-06 DIAGNOSIS — R809 Proteinuria, unspecified: Secondary | ICD-10-CM | POA: Diagnosis not present

## 2023-02-06 DIAGNOSIS — E876 Hypokalemia: Secondary | ICD-10-CM | POA: Diagnosis not present

## 2023-02-06 DIAGNOSIS — Z79899 Other long term (current) drug therapy: Secondary | ICD-10-CM | POA: Diagnosis not present

## 2023-02-06 DIAGNOSIS — Z0001 Encounter for general adult medical examination with abnormal findings: Secondary | ICD-10-CM | POA: Diagnosis not present

## 2023-02-15 ENCOUNTER — Ambulatory Visit (HOSPITAL_COMMUNITY)
Admission: RE | Admit: 2023-02-15 | Discharge: 2023-02-15 | Disposition: A | Discharge status: Home / Self Care | Payer: 59 | Source: Ambulatory Visit | Attending: Family Medicine | Admitting: Family Medicine

## 2023-02-15 DIAGNOSIS — F17200 Nicotine dependence, unspecified, uncomplicated: Secondary | ICD-10-CM | POA: Insufficient documentation

## 2023-02-15 DIAGNOSIS — Z780 Asymptomatic menopausal state: Secondary | ICD-10-CM | POA: Diagnosis not present

## 2023-02-15 DIAGNOSIS — M85831 Other specified disorders of bone density and structure, right forearm: Secondary | ICD-10-CM | POA: Insufficient documentation

## 2023-02-15 DIAGNOSIS — Z13820 Encounter for screening for osteoporosis: Secondary | ICD-10-CM | POA: Insufficient documentation

## 2023-02-27 DIAGNOSIS — L110 Acquired keratosis follicularis: Secondary | ICD-10-CM | POA: Diagnosis not present

## 2023-02-27 DIAGNOSIS — M79671 Pain in right foot: Secondary | ICD-10-CM | POA: Diagnosis not present

## 2023-02-27 DIAGNOSIS — M79672 Pain in left foot: Secondary | ICD-10-CM | POA: Diagnosis not present

## 2023-02-27 DIAGNOSIS — I739 Peripheral vascular disease, unspecified: Secondary | ICD-10-CM | POA: Diagnosis not present

## 2023-02-27 DIAGNOSIS — M79674 Pain in right toe(s): Secondary | ICD-10-CM | POA: Diagnosis not present

## 2023-02-27 DIAGNOSIS — E1140 Type 2 diabetes mellitus with diabetic neuropathy, unspecified: Secondary | ICD-10-CM | POA: Diagnosis not present

## 2023-02-27 DIAGNOSIS — M79675 Pain in left toe(s): Secondary | ICD-10-CM | POA: Diagnosis not present

## 2023-03-02 DIAGNOSIS — Z961 Presence of intraocular lens: Secondary | ICD-10-CM | POA: Diagnosis not present

## 2023-03-02 DIAGNOSIS — H401121 Primary open-angle glaucoma, left eye, mild stage: Secondary | ICD-10-CM | POA: Diagnosis not present

## 2023-03-02 DIAGNOSIS — Z79899 Other long term (current) drug therapy: Secondary | ICD-10-CM | POA: Diagnosis not present

## 2023-03-02 DIAGNOSIS — H401113 Primary open-angle glaucoma, right eye, severe stage: Secondary | ICD-10-CM | POA: Diagnosis not present

## 2023-03-02 DIAGNOSIS — H3581 Retinal edema: Secondary | ICD-10-CM | POA: Diagnosis not present

## 2023-03-02 DIAGNOSIS — H30033 Focal chorioretinal inflammation, peripheral, bilateral: Secondary | ICD-10-CM | POA: Diagnosis not present

## 2023-03-22 ENCOUNTER — Ambulatory Visit: Payer: 59 | Attending: Gastroenterology | Admitting: Physical Therapy

## 2023-03-22 ENCOUNTER — Other Ambulatory Visit: Payer: Self-pay

## 2023-03-22 DIAGNOSIS — R279 Unspecified lack of coordination: Secondary | ICD-10-CM | POA: Diagnosis not present

## 2023-03-22 DIAGNOSIS — R293 Abnormal posture: Secondary | ICD-10-CM | POA: Insufficient documentation

## 2023-03-22 DIAGNOSIS — M6281 Muscle weakness (generalized): Secondary | ICD-10-CM | POA: Diagnosis not present

## 2023-03-22 DIAGNOSIS — M6289 Other specified disorders of muscle: Secondary | ICD-10-CM | POA: Insufficient documentation

## 2023-03-22 DIAGNOSIS — R152 Fecal urgency: Secondary | ICD-10-CM | POA: Diagnosis not present

## 2023-03-22 DIAGNOSIS — R159 Full incontinence of feces: Secondary | ICD-10-CM | POA: Insufficient documentation

## 2023-03-22 NOTE — Therapy (Signed)
OUTPATIENT PHYSICAL THERAPY FEMALE PELVIC EVALUATION   Patient Name: Rebekah Gutierrez MRN: [id number] DOB:[birth year], 77 y.o., female Today's Date: 03/22/2023  END OF SESSION:  PT End of Session - 03/22/23 1232     Visit Number 1    Date for PT Re-Evaluation 05/23/23    Authorization Type [employer] MCR    Authorization Time Period 05/23/23    Progress Note Due on Visit 10    PT Start Time 1230    PT Stop Time 1310    PT Time Calculation (min) 40 min    Activity Tolerance Patient tolerated treatment well    Behavior During Therapy [facility] for tasks assessed/performed             Past Medical History:  Diagnosis Date   Diabetes (HCC)    Encounter for screening colonoscopy 06/05/2012   GERD (gastroesophageal reflux disease)    Hiatal hernia    Hypertension    Vertigo    Past Surgical History:  Procedure Laterality Date   BACK SURGERY     L4-5 PLIF   BLADDER SURGERY     CERVICAL SPINE SURGERY     fusion   CHOLECYSTECTOMY, LAPAROSCOPIC     Remote Past   COLONOSCOPY  06/20/2012   Procedure: COLONOSCOPY;  Surgeon: Malissa Hippo, MD;  [facility];  Service: Endoscopy;  Laterality: N/A;  250   COLONOSCOPY WITH PROPOFOL N/A 07/26/2021   Procedure: COLONOSCOPY WITH PROPOFOL;  Surgeon: Dolores Frame, MD;  [facility];  Service: Gastroenterology;  Laterality: N/A;  9:25   FOOT SURGERY     left    HAND SURGERY     left   INCISIONAL HERNIA REPAIR N/A 05/26/2020   Procedure: Mena Pauls WITH MESH;  Surgeon: Franky Macho, MD;  Location: [facility];  Service: General;  Laterality: N/A;   POLYPECTOMY  07/26/2021   Procedure: POLYPECTOMY;  Surgeon: Dolores Frame, MD;  [facility];  Service: Gastroenterology;;   TUBAL LIGATION     Patient Active Problem List   Diagnosis Date Noted   Diarrhea 01/16/2023   Pelvic floor dysfunction in female 01/16/2023   Anal sphincter spasm 04/18/2022   Loose stools 04/18/2022    Incisional hernia, without obstruction or gangrene    Aortic atherosclerosis (HCC) 01/05/2020   Ventral hernia 06/09/2019   Hypertriglyceridemia 04/21/2019   Multiple lung nodules on CT 04/30/2017   Spinal stenosis of lumbar region 05/02/2016   DDD (degenerative disc disease), lumbar 05/02/2016   Constipation 06/15/2015   OAB (overactive bladder) 11/25/2014   Pain in the chest 09/02/2014   Nonspecific abnormal electrocardiogram (ECG) (EKG) 09/02/2014   Chest pain at rest 09/02/2014   Dizziness    Type 2 diabetes mellitus with hyperglycemia (HCC) 01/21/2014   Incontinence of feces with fecal urgency 01/21/2014   B12 deficiency 02/06/2013   Shoulder pain 08/23/2012   Leg cramps 04/29/2012   Vertigo, benign positional 01/28/2012   Dyspepsia 11/29/2011   Insomnia 11/29/2011   Tobacco user 11/29/2011   Essential hypertension, benign 11/27/2011    PCP: Nita Sells, MD  REFERRING PROVIDER: Raquel James, NP    REFERRING DIAG: R15.9,R15.2 (ICD-10-CM) - Incontinence of feces with fecal urgency  THERAPY DIAG:  Muscle weakness (generalized)  Abnormal posture  Unspecified lack of coordination  Rationale for Evaluation and Treatment: Rehabilitation  ONSET DATE: 10/2022  SUBJECTIVE:  SUBJECTIVE STATEMENT: Pt has various amounts of fecal leakage without ability to control it.   Fluid intake: Yes: water - 3 bottles per day; coffee cup in am; 3 glasses of tea a day, sprite daily as well     PAIN:  Are you having pain? Yes NPRS scale: 10-0/10 Pain location: Anal  Pain type: spasms, sharp Pain description: intermittent   Aggravating factors: randomly, having bowel movements  Relieving factors: trying to relax  PRECAUTIONS: None  RED FLAGS: Bowel or bladder incontinence: Yes: see eval for  details    WEIGHT BEARING RESTRICTIONS: No  FALLS:  Has patient fallen in last 6 months? 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 DM, GERD, hiatal hernia, HTN, vertigo.    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, questionable underlying hypersensitivity of the rectal area  Sexual abuse: No  BOWEL MOVEMENT: Pain with bowel movement: Yes Type of bowel movement:Type (Bristol Stool Scale) usually 6-7 sometimes 3, Frequency 2-3 in morning/ 1 in evening daily, and Strain No Fully empty rectum: No Leakage: Yes: sometimes small sometimes a lot  and not always with an urge to go  Pads: Yes: pull ups changed 3 in morning and 1-2x in evening  Fiber supplement: Yes: fiber therapy  URINATION: Pain with urination: No Fully empty bladder: No Stream: Strong Urgency: No Frequency: every 2-4 hours, 1x sometimes at night Leakage:  none Pads: No  INTERCOURSE: Pain with intercourse:  not active Ability to have vaginal penetration:  Yes:   Climax: not active Marinoff Scale: 0/3  PREGNANCY: Vaginal deliveries 2 Tearing No C-section deliveries 0 Currently pregnant No  PROLAPSE: Sometimes heaviness felt in rectum    OBJECTIVE:   DIAGNOSTIC FINDINGS:    COGNITION: Overall cognitive status: Within functional limits for tasks assessed     SENSATION: Light touch: Deficits numbness, tingling in bil legs into feet Proprioception: Appears intact  MUSCLE LENGTH: Bil hamstring and adductors limited by 50%    GAIT: Distance walked: 300' Assistive device utilized: None Level of assistance:  Complete Independence Comments: rounded shoulders, forward head, decreased arm swing and trunk rotation  POSTURE: rounded shoulders, forward head, posterior pelvic tilt, and flexed trunk   PELVIC ALIGNMENT:WFL  LUMBARAROM/PROM:  A/PROM A/PROM  eval  Flexion Limited by 50%  Extension Limited by 25%  Right lateral flexion Limited by 75%  Left lateral flexion Limited by 75%  Right rotation Limited by 50%  Left rotation Limited by 50%   (Blank rows = not tested)  LOWER EXTREMITY ROM:  WFL  LOWER EXTREMITY MMT: Bil hip grossly 3+/5, knees 5/5  PALPATION:   General  TTP upper rt abdominal quadrant, fascial restrictions throughout, tight bil lumbar and thoracic paraspinals and gluteals.                External Perineal Exam pt deferred                              Internal Pelvic Floor pt deferred   Patient confirms identification and approves PT to assess internal pelvic floor and treatment No  PELVIC MMT:   MMT eval  Vaginal  Internal Anal Sphincter   External Anal Sphincter   Puborectalis   Diastasis Recti   (Blank rows = not tested)        TONE: pt deferred   PROLAPSE: pt deferred   TODAY'S TREATMENT:                                                                                                                              DATE:   03/22/23 EVAL Examination completed, findings reviewed, pt educated on POC, HEP, and abdominal massage and step stool for bowel movements. Pt motivated to participate in PT and agreeable to attempt recommendations.     PATIENT EDUCATION:  Education details: [id number] Person educated: Patient Education method: Explanation, Demonstration, Tactile cues, Verbal cues, and Handouts Education comprehension: verbalized understanding and returned demonstration  HOME EXERCISE PROGRAM: [id number]  ASSESSMENT:  CLINICAL IMPRESSION: Patient is a 77 y.o. female  who was seen today for physical therapy evaluation and treatment for fecal  leakage. Pt found to have decreased flexibility in spine and hips, decreased core and hip strength, impaired posture, fascial restrictions in abdomen. Pt deferred internal assessment today and requested not to do this at this time. Pt educated on HEP, abdominal massage and voiding mechanics today. Pt would benefit from additional PT to further address deficits.      OBJECTIVE IMPAIRMENTS: decreased coordination, decreased endurance, decreased strength, increased fascial restrictions, increased muscle spasms, impaired flexibility, improper body mechanics, postural dysfunction, and pain.   ACTIVITY LIMITATIONS: continence  PARTICIPATION LIMITATIONS: community activity, occupation, and church  PERSONAL FACTORS: Time since onset of injury/illness/exacerbation and 1 comorbidity: medical history  are also affecting patient's functional outcome.   REHAB POTENTIAL: Good  CLINICAL DECISION MAKING: Stable/uncomplicated  EVALUATION COMPLEXITY: Low   GOALS: Goals reviewed with patient? Yes  SHORT TERM GOALS: Target date: 04/19/23  Pt to be I with HEP.  Baseline: Goal status: INITIAL  2.  Pt will report her BMs are complete due to improved bowel habits and evacuation techniques at least 50% of the time.  Baseline:  Goal status: INITIAL  3.  Pt to be I with abdominal massage and voiding mechanics for more complete bowel movements.  Baseline:  Goal status: INITIAL   LONG TERM GOALS: Target date: 05/23/23  Pt to be I with advanced HEP.  Baseline:  Goal status: INITIAL  2.  Pt will report her BMs are complete due to improved bowel habits and evacuation techniques at least 75% of the time.  Baseline:  Goal status: INITIAL  3.  Pt to demonstrate at least 4/5 pelvic floor strength for improved pelvic stability and decreased strain at pelvic floor/ decrease leakage.  Baseline:  Goal status: INITIAL  4.  Pt to demonstrate improved coordination of pelvic floor and breathing mechanics with 10#  squat with appropriate synergistic patterns to decrease pain and leakage at least 75% of the time.    Baseline:  Goal status: INITIAL  5.  Pt to  demonstrate at least 4+/5 bil hip strength for improved pelvic stability and functional squats without leakage.  Baseline:  Goal status: INITIAL    PLAN:  PT FREQUENCY: every other week  PT DURATION:  6 sessions  PLANNED INTERVENTIONS: Therapeutic exercises, Therapeutic activity, Neuromuscular re-education, Patient/Family education, Self Care, Joint mobilization, Electrical stimulation, Spinal mobilization, Cryotherapy, Moist heat, scar mobilization, Taping, Traction, Biofeedback, Manual therapy, and Re-evaluation  PLAN FOR NEXT SESSION: internal as needed and pt consents; core and hip strengthening, stretching hips and spine, breathing mechanics, voiding mechanics, posture training, coordination of pelvic floor and breathing with exercises  Otelia Sergeant, PT, DPT 07/11/242:10 PM

## 2023-05-01 ENCOUNTER — Other Ambulatory Visit (INDEPENDENT_AMBULATORY_CARE_PROVIDER_SITE_OTHER): Payer: Self-pay | Admitting: Gastroenterology

## 2023-05-01 NOTE — Telephone Encounter (Signed)
Last seen 01/16/23

## 2023-05-04 DIAGNOSIS — H04123 Dry eye syndrome of bilateral lacrimal glands: Secondary | ICD-10-CM | POA: Diagnosis not present

## 2023-05-04 DIAGNOSIS — H401121 Primary open-angle glaucoma, left eye, mild stage: Secondary | ICD-10-CM | POA: Diagnosis not present

## 2023-05-04 DIAGNOSIS — Z961 Presence of intraocular lens: Secondary | ICD-10-CM | POA: Diagnosis not present

## 2023-05-04 DIAGNOSIS — H30033 Focal chorioretinal inflammation, peripheral, bilateral: Secondary | ICD-10-CM | POA: Diagnosis not present

## 2023-05-04 DIAGNOSIS — H401113 Primary open-angle glaucoma, right eye, severe stage: Secondary | ICD-10-CM | POA: Diagnosis not present

## 2023-05-08 ENCOUNTER — Ambulatory Visit (INDEPENDENT_AMBULATORY_CARE_PROVIDER_SITE_OTHER): Payer: 59 | Admitting: Gastroenterology

## 2023-05-08 ENCOUNTER — Encounter (INDEPENDENT_AMBULATORY_CARE_PROVIDER_SITE_OTHER): Payer: Self-pay | Admitting: Gastroenterology

## 2023-05-08 VITALS — BP 127/79 | HR 71 | Temp 97.5°F | Ht 60.0 in | Wt 147.4 lb

## 2023-05-08 DIAGNOSIS — M6289 Other specified disorders of muscle: Secondary | ICD-10-CM

## 2023-05-08 DIAGNOSIS — K649 Unspecified hemorrhoids: Secondary | ICD-10-CM

## 2023-05-08 DIAGNOSIS — K594 Anal spasm: Secondary | ICD-10-CM | POA: Diagnosis not present

## 2023-05-08 DIAGNOSIS — R159 Full incontinence of feces: Secondary | ICD-10-CM

## 2023-05-08 DIAGNOSIS — R152 Fecal urgency: Secondary | ICD-10-CM

## 2023-05-08 DIAGNOSIS — R195 Other fecal abnormalities: Secondary | ICD-10-CM | POA: Diagnosis not present

## 2023-05-08 DIAGNOSIS — K640 First degree hemorrhoids: Secondary | ICD-10-CM | POA: Insufficient documentation

## 2023-05-08 MED ORDER — HYDROCORTISONE (PERIANAL) 2.5 % EX CREA: 1.0000 | TOPICAL_CREAM | Freq: Three times a day (TID) | CUTANEOUS | 1 refills | Status: AC

## 2023-05-08 NOTE — Patient Instructions (Signed)
Continue to follow up with pelvic floor therapist  Continue with dicyclomine twice daily, I sent a refill of this on 8/20, if there is an issue with you getting this, let me know  I have sent anusol cream to use 3x/day x10 days for hemorrhoids, you can use as needed thereafter  Restart benefiber, start with 1T daily, you can go up to 2-3 T per day, this will help to bulk up the stool. Please take with a meal for best results  Follow up 6 months  It was a pleasure to see you today. I want to create trusting relationships with patients and provide genuine, compassionate, and quality care. I truly value your feedback! please be on the lookout for a survey regarding your visit with me today. I appreciate your input about our visit and your time in completing this!    Marli Diego L. Jeanmarie Hubert, MSN, APRN, AGNP-C Adult-Gerontology Nurse Practitioner [facility] Gastroenterology at [facility]

## 2023-05-08 NOTE — Progress Notes (Signed)
Referring Provider: Benita Stabile, MD Primary Care Physician:  Benita Stabile, MD Primary GI Physician: Dr. Levon Hedger   Chief Complaint  Patient presents with   Follow-up    Patient here today for a follow up on her fecal incontinence . Patient says she is still having the occasional episode of incontinence, but not like it had been. Patient says she has been taking bentyl 10 mg bid, and she feels this has helped a lot with her issues. She will need a refill on this today.   HPI:   Rebekah Gutierrez. Kiehn is a 77 y.o. female with past medical history of DM, GERD, hiatal hernia, HTN, vertigo.    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 Having diarrhea, fecal incontinence at that time still, taking dicyclomine 1-2x/day. Occasional toilet tissue hematochezia.  Gi pathogen panel and c diff testing done due to more watery stools, advised to resume benefiber, continue bentyl and referred to pelvic floor therapy in [place]  Stool testing was negative.  She saw pelvic floor therapist on 7/11, recommended to have 6 more sessions every other week.  Present:  She has upcoming appt with PT again in September, having 1 BM per day usually, some days up to 2-3 but this is not often. Stools can be looser to softer, no more watery diarrhea. Still with some fecal urgency/incontinence. She is taking dicyclomine which helps some, she takes this BID. She denies any abdominal pain. She notes some rectal pain at times, this has been ongoing, she is unsure if dicyclomine helps with this pain. Pain is intermittent, though it gets worse if she has more BMs.   She notes some occasional toilet tissue hematochezia. She has history of hemorrhoids and had previous hemorrhoidectomy in the past. Previous rectal exam done by me with hemorrhoids present. She also notes a lot of abdominal noisiness even after having eaten. Unsure if this is lessened with use of dicyclomine or not.   Last Colonoscopy:Nov 2022- Two 4 to 8 mm polyps in the proximal rectum, One 3 mm polyp in the proximal transverse colon. - Multiple 3 to 6 mm polyps in the rectum. - Diverticulosis in the entire examined colon. - The distal rectum and anal verge are normal on retroflexion view.   Repeat colonoscopy in nov 2025   Past Medical History:  Diagnosis Date   Diabetes [facility])    Encounter for screening colonoscopy 06/05/2012   GERD (gastroesophageal reflux disease)    Hiatal hernia    Hypertension    Vertigo     Past Surgical History:  Procedure Laterality Date   BACK SURGERY     L4-5 PLIF   BLADDER SURGERY     CERVICAL SPINE SURGERY     fusion   CHOLECYSTECTOMY, LAPAROSCOPIC     Remote Past   COLONOSCOPY  06/20/2012   Procedure: COLONOSCOPY;  Surgeon: Malissa Hippo, MD;  [facility];  Service: Endoscopy;  Laterality: N/A;  250   COLONOSCOPY WITH PROPOFOL N/A 07/26/2021   Procedure: COLONOSCOPY WITH PROPOFOL;  Surgeon: Dolores Frame, MD;  Location: [facility];  Service: Gastroenterology;  Laterality: N/A;  9:25   FOOT SURGERY  left    HAND SURGERY     left   INCISIONAL HERNIA REPAIR N/A 05/26/2020   Procedure: Mena Pauls WITH MESH;  Surgeon: Franky Macho, MD;  Location: [facility];  Service: General;  Laterality: N/A;   POLYPECTOMY  07/26/2021   Procedure: POLYPECTOMY;  Surgeon: Marguerita Merles, Reuel Boom, MD;  Location: [facility];  Service: Gastroenterology;;   TUBAL LIGATION      Current Outpatient Medications  Medication Sig Dispense Refill   acetaminophen (TYLENOL) 500 MG tablet Take 1,000 mg by mouth every 6 (six)  hours as needed for moderate pain.     albuterol (VENTOLIN HFA) 108 (90 Base) MCG/ACT inhaler Inhale 2 puffs into the lungs every 6 (six) hours as needed for wheezing or shortness of breath. 8 g 0   ALPHAGAN P 0.1 % SOLN Place 1 drop into both eyes 2 (two) times daily.     amLODipine (NORVASC) 10 MG tablet Take 1 tablet (10 mg total) by mouth daily. 90 tablet 3   Apoaequorin (PREVAGEN PO) Take 1 tablet by mouth daily.     aspirin 325 MG EC tablet Take 325 mg by mouth daily.     atorvastatin (LIPITOR) 20 MG tablet Take 0.5 tablets (10 mg total) by mouth daily. 90 tablet 3   Calcium Carb-Cholecalciferol (CALCIUM 1000 + D PO) Take 1 tablet by mouth daily.     Cinnamon 500 MG TABS Take 500 mg by mouth at bedtime.      dicyclomine (BENTYL) 10 MG capsule TAKE 1 CAPSULE BY MOUTH TWICE DAILY AS NEEDED SPASM 60 capsule 0   dorzolamide-timolol (COSOPT) 22.3-6.8 MG/ML ophthalmic solution Place 1 drop into both eyes 2 (two) times daily.     FARXIGA 10 MG TABS tablet Take 10 mg by mouth daily.     gabapentin (NEURONTIN) 300 MG capsule TAKE 1 CAPSULE BY MOUTH ONCE DAILY AT BEDTIME FOR BACK PAIN 90 capsule 3   glipiZIDE (GLUCOTROL XL) 10 MG 24 hr tablet Take 1 tablet (10 mg total) by mouth daily with breakfast. 90 tablet 3   glucose blood (ONETOUCH ULTRA) test strip USE 1 STRIP TO CHECK GLUCOSE TWICE DAILY AS DIRECTED 100 each 3   hydrochlorothiazide (HYDRODIURIL) 12.5 MG tablet Take 12.5 mg by mouth daily.     Lancets (ONETOUCH DELICA PLUS LANCET33G) MISC USE 1 LANCET TO CHECK GLUCOSE TWICE DAILY AS DIRECTED 100 each 3   lisinopril (ZESTRIL) 40 MG tablet Take 1 tablet (40 mg total) by mouth daily. 90 tablet 3   Methylcellulose, Laxative, (FIBER THERAPY) 500 MG TABS Take 500 mg by mouth daily.     Multiple Vitamin (MULTIVITAMIN WITH MINERALS) TABS tablet Take 1 tablet by mouth daily.     mycophenolate (CELLCEPT) 500 MG tablet Take 1,000 mg by mouth 2 (two) times daily.     Omega-3 Fatty Acids (FISH OIL) 1000  MG CAPS tAKE 1000MG  TWICE A DAY FOR CHOLESTEROL (Patient taking differently: Take 1,000 mg by mouth in the morning and at bedtime.)  0   Potassium 99 MG TABS Take 99 mg by mouth daily.     VYZULTA 0.024 % SOLN Place 1 drop into both eyes at bedtime.      No current facility-administered medications for this visit.    Allergies as of 05/08/2023   (No Known Allergies)    Family History  Problem Relation Age of Onset   Hypertension Mother    Stroke Mother    Hypertension Father    Cancer Father  Colon cancer Neg Hx     Social History   Socioeconomic History   Marital status: Divorced    Spouse name: Not on file   Number of children: Not on file   Years of education: Not on file   Highest education level: Not on file  Occupational History   Not on file  Tobacco Use   Smoking status: Every Day    Current packs/day: 0.50    Average packs/day: 0.5 packs/day for 45.0 years (22.5 ttl pk-yrs)    Types: Cigarettes    Passive exposure: Current   Smokeless tobacco: Never   Tobacco comments:    1/2 pack a day since age 78  Vaping Use   Vaping status: Never Used  Substance and Sexual Activity   Alcohol use: No    Alcohol/week: 0.0 standard drinks of alcohol   Drug use: No   Sexual activity: Yes    Birth control/protection: Post-menopausal, Surgical  Other Topics Concern   Not on file  Social History Narrative   Not on file   Social Determinants of Health   Financial Resource Strain: Low Risk  (04/15/2020)   Overall Financial Resource Strain (CARDIA)    Difficulty of Paying Living Expenses: Not very hard  Food Insecurity: Not on file  Transportation Needs: Not on file  Physical Activity: Not on file  Stress: Not on file  Social Connections: Not on file   Review of systems General: negative for malaise, night sweats, fever, chills, weight loss Neck: Negative for lumps, goiter, pain and significant neck swelling Resp: Negative for cough, wheezing, dyspnea at rest CV:  Negative for chest pain, leg swelling, palpitations, orthopnea GI: denies melena, nausea, vomiting, constipation, dysphagia, odyonophagia, early satiety or unintentional weight loss. +loose stools, +fecal urgency/incontinence +toilet tissue hematochezia +rectal discomfort  MSK: Negative for joint pain or swelling, back pain, and muscle pain. Derm: Negative for itching or rash Psych: Denies depression, anxiety, memory loss, confusion. No homicidal or suicidal ideation.  Heme: Negative for prolonged bleeding, bruising easily, and swollen nodes. Endocrine: Negative for cold or heat intolerance, polyuria, polydipsia and goiter. Neuro: negative for tremor, gait imbalance, syncope and seizures. The remainder of the review of systems is noncontributory.  Physical Exam: BP 127/79 (BP Location: Left Arm, Patient Position: Sitting, Cuff Size: Normal)   Pulse 71   Temp (!) 97.5 F (36.4 C) (Temporal)   Ht 5' (1.524 m)   Wt 147 lb 6.4 oz (66.9 kg)   BMI 28.79 kg/m  General:   Alert and oriented. No distress noted. Pleasant and cooperative.  Head:  Normocephalic and atraumatic. Eyes:  Conjuctiva clear without scleral icterus. Mouth:  Oral mucosa pink and moist. Good dentition. No lesions. Heart: Normal rate and rhythm, s1 and s2 heart sounds present.  Lungs: Clear lung sounds in all lobes. Respirations equal and unlabored. Abdomen:  +BS, soft, non-tender and non-distended. No rebound or guarding. No HSM or masses noted. Derm: No palmar erythema or jaundice Msk:  Symmetrical without gross deformities. Normal posture. Extremities:  Without edema. Neurologic:  Alert and  oriented x4 Psych:  Alert and cooperative. Normal mood and affect.  Invalid input(s): "6 MONTHS"   ASSESSMENT: Rebekah Gutierrez is a 77 y.o. female presenting today for follow up of diarrhea/fecal incontinence/anorectal dyssynergia/pelvic floor dysfunction   Still with looser stools, fecal urgency/incontinence. She is following  with pelvic floor therapist and has upcoming appointment with them again in September.  She has been doing the exercises that they instructed her  on and using dicyclomine twice daily which has helped.  She was previously on Benefiber to help bulk up stool but notes she has not been taking this recently.  Recommended she restart Benefiber 1 tablespoon daily, can increase to twice daily to 3 times daily as tolerated.   She notes some continued, ongoing intermittent rectal pain and some chronic intermittent toilet tissue hematochezia.  History of suspected anal sphincter spams and hemorrhoids noted on previous rectal exam done by me. She had fairly recent colonoscopy in 2022. Can continue with dicyclomine BID and  Will send Anusol cream for her to use 3 times daily for hemorrhoids.    PLAN:  Continue to follow with PFT  2. Continue with dicyclomine BID  3. Anusol TID x10 days then PRN thereafter 4. Restart benefiber 1T daily, can increase to BID-TID as tolerated , take with a meal 5. Good water intake  All questions were answered, patient verbalized understanding and is in agreement with plan as outlined above.   Follow Up: 6 months   Junious Ragone L. Jeanmarie Hubert, MSN, APRN, AGNP-C Adult-Gerontology Nurse Practitioner [facility]  I have reviewed the note and agree with the APP's assessment as described in this progress note  Katrinka Blazing, MD Gastroenterology and Hepatology [facility] Gastroenterology

## 2023-05-10 DIAGNOSIS — I10 Essential (primary) hypertension: Secondary | ICD-10-CM | POA: Diagnosis not present

## 2023-05-10 DIAGNOSIS — E1165 Type 2 diabetes mellitus with hyperglycemia: Secondary | ICD-10-CM | POA: Diagnosis not present

## 2023-05-10 DIAGNOSIS — Z13820 Encounter for screening for osteoporosis: Secondary | ICD-10-CM | POA: Diagnosis not present

## 2023-05-15 ENCOUNTER — Ambulatory Visit: Payer: 59 | Attending: Gastroenterology | Admitting: Physical Therapy

## 2023-05-15 DIAGNOSIS — R279 Unspecified lack of coordination: Secondary | ICD-10-CM | POA: Diagnosis not present

## 2023-05-15 DIAGNOSIS — R293 Abnormal posture: Secondary | ICD-10-CM | POA: Insufficient documentation

## 2023-05-15 DIAGNOSIS — M6281 Muscle weakness (generalized): Secondary | ICD-10-CM | POA: Insufficient documentation

## 2023-05-15 NOTE — Therapy (Signed)
OUTPATIENT PHYSICAL THERAPY FEMALE PELVIC TREATMENT   Patient Name: Rebekah Gutierrez MRN: [id number] DOB:1945/12/30, 77 y.o., female Today's Date: 05/15/2023 Progress Note Reporting Period 03/22/23 to 05/15/23  See note below for Objective Data and Assessment of Progress/Goals.     END OF SESSION:  PT End of Session - 05/15/23 1145     Visit Number 2    Date for PT Re-Evaluation 07/15/23    Authorization Type [employer] MCR    Authorization Time Period --    Progress Note Due on Visit 10    PT Start Time 1145    PT Stop Time 1227    PT Time Calculation (min) 42 min    Activity Tolerance Patient tolerated treatment well    Behavior During Therapy WFL for tasks assessed/performed             Past Medical History:  Diagnosis Date   Diabetes (HCC)    Encounter for screening colonoscopy 06/05/2012   GERD (gastroesophageal reflux disease)    Hiatal hernia    Hypertension    Vertigo    Past Surgical History:  Procedure Laterality Date   BACK SURGERY     L4-5 PLIF   BLADDER SURGERY     CERVICAL SPINE SURGERY     fusion   CHOLECYSTECTOMY, LAPAROSCOPIC     Remote Past   COLONOSCOPY  06/20/2012   Procedure: COLONOSCOPY;  Surgeon: Malissa Hippo, MD;  [facility];  Service: Endoscopy;  Laterality: N/A;  250   COLONOSCOPY WITH PROPOFOL N/A 07/26/2021   Procedure: COLONOSCOPY WITH PROPOFOL;  Surgeon: Dolores Frame, MD;  [facility];  Service: Gastroenterology;  Laterality: N/A;  9:25   FOOT SURGERY     left    HAND SURGERY     left   INCISIONAL HERNIA REPAIR N/A 05/26/2020   Procedure: Mena Pauls WITH MESH;  Surgeon: Franky Macho, MD;  Location: [facility];  Service: General;  Laterality: N/A;   POLYPECTOMY  07/26/2021   Procedure: POLYPECTOMY;  Surgeon: Dolores Frame, MD;  [facility];  Service: Gastroenterology;;   TUBAL LIGATION     Patient Active Problem List   Diagnosis Date Noted   Grade I hemorrhoids  05/08/2023   Diarrhea 01/16/2023   Pelvic floor dysfunction in female 01/16/2023   Anal sphincter spasm 04/18/2022   Loose stools 04/18/2022   Incisional hernia, without obstruction or gangrene    Aortic atherosclerosis (HCC) 01/05/2020   Ventral hernia 06/09/2019   Hypertriglyceridemia 04/21/2019   Multiple lung nodules on CT 04/30/2017   Spinal stenosis of lumbar region 05/02/2016   DDD (degenerative disc disease), lumbar 05/02/2016   Constipation 06/15/2015   OAB (overactive bladder) 11/25/2014   Pain in the chest 09/02/2014   Nonspecific abnormal electrocardiogram (ECG) (EKG) 09/02/2014   Chest pain at rest 09/02/2014   Dizziness    Type 2 diabetes mellitus with hyperglycemia (HCC) 01/21/2014   Incontinence of feces with fecal urgency 01/21/2014   B12 deficiency 02/06/2013   Shoulder pain 08/23/2012   Leg cramps 04/29/2012   Vertigo, benign positional 01/28/2012   Dyspepsia 11/29/2011   Insomnia 11/29/2011   Tobacco user 11/29/2011   Essential hypertension, benign 11/27/2011    PCP: Nita Sells, MD  REFERRING PROVIDER: Raquel James, NP    REFERRING DIAG: [id number] (ICD-10-CM) - Incontinence of feces with fecal urgency  THERAPY DIAG:  Muscle weakness (generalized) - Plan: PT plan of care cert/re-cert  Abnormal posture - Plan: PT plan  of care cert/re-cert  Unspecified lack of coordination - Plan: PT plan of care cert/re-cert  Rationale for Evaluation and Treatment: Rehabilitation  ONSET DATE: 10/2022  SUBJECTIVE:                                                                                                                                                                                           SUBJECTIVE STATEMENT: No longer having pain with going to have bowel movement but does have pain in back or flank inconsistent. Sometimes does have soreness after a looser bowel movement but reports she wipes a lot and thinks skin is raw. Has returned to using  benefiber and this has helped a lot with decreasing leakage. Has fecal leakage a couple times a week now but no daily like it was. Now only changing a pull up 1-2x in the morning 1-2x a week.   Fluid intake: Yes: water - 3 bottles per day; coffee cup in am; 3 glasses of tea a day, sprite daily as well     PAIN:  Are you having pain? No NPRS scale: 10-0/10 Pain location: Anal  Pain type: spasms, sharp Pain description: intermittent   Aggravating factors: randomly, having bowel movements  Relieving factors: trying to relax  PRECAUTIONS: None  RED FLAGS: Bowel or bladder incontinence: Yes: see eval for details    WEIGHT BEARING RESTRICTIONS: No  FALLS:  Has patient fallen in last 6 months? 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 DM, GERD, hiatal hernia, HTN, vertigo.    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, questionable underlying hypersensitivity of the rectal area  Sexual abuse: No  BOWEL MOVEMENT: Pain with bowel movement: Yes Type of bowel movement:Type (Bristol Stool Scale) usually 6-7 sometimes 3, Frequency 2-3 in morning/ 1 in evening daily, and Strain No Fully empty rectum: No Leakage: Yes: sometimes small sometimes a lot  and not always with an urge to go  Pads: Yes: pull ups changed 3 in morning and 1-2x in evening  Fiber supplement: Yes: fiber therapy  URINATION: Pain with urination: No Fully empty bladder: No Stream: Strong Urgency: No Frequency: every 2-4 hours, 1x sometimes at night Leakage:  none Pads: No  INTERCOURSE: Pain with intercourse:  not active Ability  to have vaginal penetration:  Yes:   Climax: not  active Marinoff Scale: 0/3  PREGNANCY: Vaginal deliveries 2 Tearing No C-section deliveries 0 Currently pregnant No  PROLAPSE: Sometimes heaviness felt in rectum    OBJECTIVE:   DIAGNOSTIC FINDINGS:    COGNITION: Overall cognitive status: Within functional limits for tasks assessed     SENSATION: Light touch: Deficits numbness, tingling in bil legs into feet Proprioception: Appears intact  MUSCLE LENGTH: Bil hamstring and adductors limited by 50%    GAIT: Distance walked: 300' Assistive device utilized: None Level of assistance: Complete Independence Comments: rounded shoulders, forward head, decreased arm swing and trunk rotation  POSTURE: rounded shoulders, forward head, posterior pelvic tilt, and flexed trunk   PELVIC ALIGNMENT:WFL  LUMBARAROM/PROM:  A/PROM A/PROM  eval  Flexion Limited by 50%  Extension Limited by 25%  Right lateral flexion Limited by 75%  Left lateral flexion Limited by 75%  Right rotation Limited by 50%  Left rotation Limited by 50%   (Blank rows = not tested)  LOWER EXTREMITY ROM:  WFL  LOWER EXTREMITY MMT: Bil hip grossly 3+/5, knees 5/5  PALPATION:   General  TTP upper rt abdominal quadrant, fascial restrictions throughout, tight bil lumbar and thoracic paraspinals and gluteals.                External Perineal Exam pt deferred                              Internal Pelvic Floor pt deferred   Patient confirms identification and approves PT to assess internal pelvic floor and treatment No  PELVIC MMT:   MMT eval  Vaginal   Internal Anal Sphincter   External Anal Sphincter   Puborectalis   Diastasis Recti   (Blank rows = not tested)        TONE: pt deferred   PROLAPSE: pt deferred   TODAY'S TREATMENT:                                                                                                                              DATE:    05/15/23: Abdominal  massage - PT completed with pt in supine, pt demonstrated incorrect technique - PT completed x10 CW/CCW then pt demonstrated correct.  Counter cat/cow x10 - max cues for technique Counter needle thread x5 20s each Lumbar rotation x10 5s holds each Sidelying open books 3x30s Seated pelvic floor contractions with towel roll - 3sx10 2 sets Pt reports unable to hold contraction for 5s, confirmed 3s isometric but did need towel roll for sensation of pelvic floor contraction. Reports unable to feel if she was contracting without it.     PATIENT EDUCATION:  Education details: [id number] Person educated: Patient Education method: Explanation, Demonstration, Tactile cues, Verbal cues, and Handouts Education comprehension: verbalized understanding and returned demonstration  HOME EXERCISE PROGRAM: [id number]  ASSESSMENT:  CLINICAL IMPRESSION: Patient presents for treatment, requests to review HEP. Pt did benefit from cues  for exercises for technique. Pt tolerated well and able to correct well. Pt verbalized better understanding of these now. Pt also educated on initiating pelvic floor contractions, benefited from towel roll sitting between legs for improved sensation of pelvic floor mobility. Pt declined internal assessment today. Reports she does feel contraction over towel roll, unable to hold for 5s but repots 3s is doable. Pt unable to get back into clinic for appointment until now therefore continues to progress toward goals. Pt would benefit from additional PT to further address deficits.      OBJECTIVE IMPAIRMENTS: decreased coordination, decreased endurance, decreased strength, increased fascial restrictions, increased muscle spasms, impaired flexibility, improper body mechanics, postural dysfunction, and pain.   ACTIVITY LIMITATIONS: continence  PARTICIPATION LIMITATIONS: community activity, occupation, and church  PERSONAL FACTORS: Time since onset of injury/illness/exacerbation and 1  comorbidity: medical history  are also affecting patient's functional outcome.   REHAB POTENTIAL: Good  CLINICAL DECISION MAKING: Stable/uncomplicated  EVALUATION COMPLEXITY: Low   GOALS: Goals reviewed with patient? Yes  SHORT TERM GOALS: Target date: 04/19/23  Pt to be I with HEP.  Baseline: Goal status: on going  2.  Pt will report her BMs are complete due to improved bowel habits and evacuation techniques at least 50% of the time.  Baseline:  Goal status: on going - improving but no consistent yet  3.  Pt to be I with abdominal massage and voiding mechanics for more complete bowel movements.  Baseline:  Goal status: on going - needed cues today 05/15/23    LONG TERM GOALS: Target date: 05/23/23  Pt to be I with advanced HEP.  Baseline:  Goal status: on going  2.  Pt will report her BMs are complete due to improved bowel habits and evacuation techniques at least 75% of the time.  Baseline:  Goal status: on going  3.  Pt to demonstrate at least 4/5 pelvic floor strength for improved pelvic stability and decreased strain at pelvic floor/ decrease leakage.  Baseline:  Goal status: pt declined assessment 05/15/23   4.  Pt to demonstrate improved coordination of pelvic floor and breathing mechanics with 10# squat with appropriate synergistic patterns to decrease pain and leakage at least 75% of the time.    Baseline:  Goal status: on going  5.  Pt to demonstrate at least 4+/5 bil hip strength for improved pelvic stability and functional squats without leakage.  Baseline:  Goal status: on going    PLAN:  PT FREQUENCY: every other week  PT DURATION:  6 sessions  PLANNED INTERVENTIONS: Therapeutic exercises, Therapeutic activity, Neuromuscular re-education, Patient/Family education, Self Care, Joint mobilization, Electrical stimulation, Spinal mobilization, Cryotherapy, Moist heat, scar mobilization, Taping, Traction, Biofeedback, Manual therapy, and  Re-evaluation  PLAN FOR NEXT SESSION: internal as needed and pt consents; core and hip strengthening, stretching hips and spine, breathing mechanics, voiding mechanics, posture training, coordination of pelvic floor and breathing with exercises  Otelia Sergeant, PT, DPT 05/15/2411:30 PM

## 2023-05-16 DIAGNOSIS — E1122 Type 2 diabetes mellitus with diabetic chronic kidney disease: Secondary | ICD-10-CM | POA: Diagnosis not present

## 2023-05-16 DIAGNOSIS — E785 Hyperlipidemia, unspecified: Secondary | ICD-10-CM | POA: Diagnosis not present

## 2023-05-16 DIAGNOSIS — R809 Proteinuria, unspecified: Secondary | ICD-10-CM | POA: Diagnosis not present

## 2023-05-16 DIAGNOSIS — E1165 Type 2 diabetes mellitus with hyperglycemia: Secondary | ICD-10-CM | POA: Diagnosis not present

## 2023-05-16 DIAGNOSIS — I10 Essential (primary) hypertension: Secondary | ICD-10-CM | POA: Diagnosis not present

## 2023-05-16 DIAGNOSIS — I700 Atherosclerosis of aorta: Secondary | ICD-10-CM | POA: Diagnosis not present

## 2023-05-16 DIAGNOSIS — E876 Hypokalemia: Secondary | ICD-10-CM | POA: Diagnosis not present

## 2023-05-16 DIAGNOSIS — G4700 Insomnia, unspecified: Secondary | ICD-10-CM | POA: Diagnosis not present

## 2023-05-16 DIAGNOSIS — N183 Chronic kidney disease, stage 3 unspecified: Secondary | ICD-10-CM | POA: Diagnosis not present

## 2023-05-16 DIAGNOSIS — Z713 Dietary counseling and surveillance: Secondary | ICD-10-CM | POA: Diagnosis not present

## 2023-05-16 DIAGNOSIS — E1142 Type 2 diabetes mellitus with diabetic polyneuropathy: Secondary | ICD-10-CM | POA: Diagnosis not present

## 2023-05-16 DIAGNOSIS — I129 Hypertensive chronic kidney disease with stage 1 through stage 4 chronic kidney disease, or unspecified chronic kidney disease: Secondary | ICD-10-CM | POA: Diagnosis not present

## 2023-05-25 DIAGNOSIS — Z79899 Other long term (current) drug therapy: Secondary | ICD-10-CM | POA: Diagnosis not present

## 2023-05-25 DIAGNOSIS — Z961 Presence of intraocular lens: Secondary | ICD-10-CM | POA: Diagnosis not present

## 2023-05-25 DIAGNOSIS — H30033 Focal chorioretinal inflammation, peripheral, bilateral: Secondary | ICD-10-CM | POA: Diagnosis not present

## 2023-05-25 DIAGNOSIS — H401113 Primary open-angle glaucoma, right eye, severe stage: Secondary | ICD-10-CM | POA: Diagnosis not present

## 2023-05-25 DIAGNOSIS — H2013 Chronic iridocyclitis, bilateral: Secondary | ICD-10-CM | POA: Diagnosis not present

## 2023-05-25 DIAGNOSIS — H40022 Open angle with borderline findings, high risk, left eye: Secondary | ICD-10-CM | POA: Diagnosis not present

## 2023-05-29 ENCOUNTER — Ambulatory Visit: Payer: 59 | Admitting: Physical Therapy

## 2023-05-29 DIAGNOSIS — H401121 Primary open-angle glaucoma, left eye, mild stage: Secondary | ICD-10-CM | POA: Diagnosis not present

## 2023-05-29 DIAGNOSIS — H3581 Retinal edema: Secondary | ICD-10-CM | POA: Diagnosis not present

## 2023-05-29 DIAGNOSIS — H401113 Primary open-angle glaucoma, right eye, severe stage: Secondary | ICD-10-CM | POA: Diagnosis not present

## 2023-05-29 DIAGNOSIS — Z961 Presence of intraocular lens: Secondary | ICD-10-CM | POA: Diagnosis not present

## 2023-05-29 DIAGNOSIS — H30033 Focal chorioretinal inflammation, peripheral, bilateral: Secondary | ICD-10-CM | POA: Diagnosis not present

## 2023-05-29 DIAGNOSIS — Z79899 Other long term (current) drug therapy: Secondary | ICD-10-CM | POA: Diagnosis not present

## 2023-06-05 DIAGNOSIS — L110 Acquired keratosis follicularis: Secondary | ICD-10-CM | POA: Diagnosis not present

## 2023-06-05 DIAGNOSIS — I739 Peripheral vascular disease, unspecified: Secondary | ICD-10-CM | POA: Diagnosis not present

## 2023-06-05 DIAGNOSIS — M79672 Pain in left foot: Secondary | ICD-10-CM | POA: Diagnosis not present

## 2023-06-05 DIAGNOSIS — E1140 Type 2 diabetes mellitus with diabetic neuropathy, unspecified: Secondary | ICD-10-CM | POA: Diagnosis not present

## 2023-06-05 DIAGNOSIS — M79675 Pain in left toe(s): Secondary | ICD-10-CM | POA: Diagnosis not present

## 2023-06-05 DIAGNOSIS — M79671 Pain in right foot: Secondary | ICD-10-CM | POA: Diagnosis not present

## 2023-06-05 DIAGNOSIS — M79674 Pain in right toe(s): Secondary | ICD-10-CM | POA: Diagnosis not present

## 2023-06-08 ENCOUNTER — Other Ambulatory Visit (INDEPENDENT_AMBULATORY_CARE_PROVIDER_SITE_OTHER): Payer: Self-pay | Admitting: Gastroenterology

## 2023-06-11 NOTE — Telephone Encounter (Signed)
Last seen 05/08/23

## 2023-06-12 ENCOUNTER — Ambulatory Visit: Payer: 59 | Attending: Gastroenterology | Admitting: Physical Therapy

## 2023-06-12 DIAGNOSIS — R293 Abnormal posture: Secondary | ICD-10-CM | POA: Insufficient documentation

## 2023-06-12 DIAGNOSIS — M6281 Muscle weakness (generalized): Secondary | ICD-10-CM | POA: Insufficient documentation

## 2023-06-12 DIAGNOSIS — R279 Unspecified lack of coordination: Secondary | ICD-10-CM | POA: Diagnosis not present

## 2023-06-12 NOTE — Therapy (Signed)
OUTPATIENT PHYSICAL THERAPY FEMALE PELVIC TREATMENT   Patient Name: Rebekah Gutierrez. Mabie MRN: [id number] DOB:[birth year], 77 y.o., female Today's Date: 06/12/2023 Progress Note     END OF SESSION:  PT End of Session - 06/12/23 1404     Visit Number 3    Date for PT Re-Evaluation 07/15/23    Authorization Type [employer] MCR    Progress Note Due on Visit 13    PT Start Time 1400    PT Stop Time 1439    PT Time Calculation (min) 39 min    Activity Tolerance Patient tolerated treatment well    Behavior During Therapy WFL for tasks assessed/performed             Past Medical History:  Diagnosis Date   Diabetes (HCC)    Encounter for screening colonoscopy 06/05/2012   GERD (gastroesophageal reflux disease)    Hiatal hernia    Hypertension    Vertigo    Past Surgical History:  Procedure Laterality Date   BACK SURGERY     L4-5 PLIF   BLADDER SURGERY     CERVICAL SPINE SURGERY     fusion   CHOLECYSTECTOMY, LAPAROSCOPIC     Remote Past   COLONOSCOPY  06/20/2012   Procedure: COLONOSCOPY;  Surgeon: Malissa Hippo, MD;  Location: [facility];  Service: Endoscopy;  Laterality: N/A;  250   COLONOSCOPY WITH PROPOFOL N/A 07/26/2021   Procedure: COLONOSCOPY WITH PROPOFOL;  Surgeon: Dolores Frame, MD;  Location: [facility];  Service: Gastroenterology;  Laterality: N/A;  9:25   FOOT SURGERY     left    HAND SURGERY     left   INCISIONAL HERNIA REPAIR N/A 05/26/2020   Procedure: Mena Pauls WITH MESH;  Surgeon: Franky Macho, MD;  Location: [facility];  Service: General;  Laterality: N/A;   POLYPECTOMY  07/26/2021   Procedure: POLYPECTOMY;  Surgeon: Dolores Frame, MD;  Location: [facility];  Service: Gastroenterology;;   TUBAL LIGATION     Patient Active Problem List   Diagnosis Date Noted   Grade I hemorrhoids 05/08/2023   Diarrhea 01/16/2023   Pelvic floor dysfunction in female 01/16/2023   Anal sphincter spasm 04/18/2022   Loose stools  04/18/2022   Incisional hernia, without obstruction or gangrene    Aortic atherosclerosis (HCC) 01/05/2020   Ventral hernia 06/09/2019   Hypertriglyceridemia 04/21/2019   Multiple lung nodules on CT 04/30/2017   Spinal stenosis of lumbar region 05/02/2016   DDD (degenerative disc disease), lumbar 05/02/2016   Constipation 06/15/2015   OAB (overactive bladder) 11/25/2014   Pain in the chest 09/02/2014   Nonspecific abnormal electrocardiogram (ECG) (EKG) 09/02/2014   Chest pain at rest 09/02/2014   Dizziness    Type 2 diabetes mellitus with hyperglycemia (HCC) 01/21/2014   Incontinence of feces with fecal urgency 01/21/2014   B12 deficiency 02/06/2013   Shoulder pain 08/23/2012   Leg cramps 04/29/2012   Vertigo, benign positional 01/28/2012   Dyspepsia 11/29/2011   Insomnia 11/29/2011   Tobacco user 11/29/2011   Essential hypertension, benign 11/27/2011    PCP: Nita Sells, MD  REFERRING PROVIDER: Raquel James, NP    REFERRING DIAG: R15.9,R15.2 (ICD-10-CM) - Incontinence of feces with fecal urgency  THERAPY DIAG:  Muscle weakness (generalized)  Abnormal posture  Unspecified lack of coordination  Rationale for Evaluation and Treatment: Rehabilitation  ONSET DATE: 10/2022  SUBJECTIVE:  SUBJECTIVE STATEMENT: Having leakage of stool -2x in the morning (was 5 during the day) and reports sometimes is moderate amount of stool and sometimes just a smear. Also varies from type 7 -5. With type 7 she has the larger amounts of leakage. Using step stool in bathroom, usually does abdominal massage and was having more formed stools but has been bust and hasn't done it this week.    Fluid intake: Yes: water - 3 bottles per day; coffee cup in am; 3 glasses of tea a day, sprite daily as well     PAIN:   Are you having pain? No - reports no longer having pain with bowel movement  NPRS scale: 0/10 Pain location: na  Pain typena Pain description:  na  Aggravating factors: na Relieving factors: na  PRECAUTIONS: None  RED FLAGS: Bowel or bladder incontinence: Yes: see eval for details    WEIGHT BEARING RESTRICTIONS: No  FALLS:  Has patient fallen in last 6 months? 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 DM, GERD, hiatal hernia, HTN, vertigo.    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, questionable underlying hypersensitivity of the rectal area  Sexual abuse: No  BOWEL MOVEMENT: Pain with bowel movement: Yes Type of bowel movement:Type (Bristol Stool Scale) usually 6-7 sometimes 3, Frequency 2-3 in morning/ 1 in evening daily, and Strain No Fully empty rectum: No Leakage: Yes: sometimes small sometimes a lot  and not always with an urge to go  Pads: Yes: pull ups changed 3 in morning and 1-2x in evening  Fiber supplement: Yes: fiber therapy  URINATION: Pain with urination: No Fully empty bladder: No Stream: Strong Urgency: No Frequency: every 2-4 hours, 1x sometimes at night Leakage:  none Pads: No  INTERCOURSE: Pain with intercourse:  not active Ability to have vaginal penetration:  Yes:   Climax: not active Marinoff Scale: 0/3  PREGNANCY: Vaginal deliveries 2 Tearing No C-section deliveries 0 Currently pregnant No  PROLAPSE: Sometimes heaviness felt in rectum    OBJECTIVE:   DIAGNOSTIC FINDINGS:    COGNITION: Overall cognitive status: Within functional limits for  tasks assessed     SENSATION: Light touch: Deficits numbness, tingling in bil legs into feet Proprioception: Appears intact  MUSCLE LENGTH: Bil hamstring and adductors limited by 50%    GAIT: Distance walked: 300' Assistive device utilized: None Level of assistance: Complete Independence Comments: rounded shoulders, forward head, decreased arm swing and trunk rotation  POSTURE: rounded shoulders, forward head, posterior pelvic tilt, and flexed trunk   PELVIC ALIGNMENT:WFL  LUMBARAROM/PROM:  A/PROM A/PROM  eval  Flexion Limited by 50%  Extension Limited by 25%  Right lateral flexion Limited by 75%  Left lateral flexion Limited by 75%  Right rotation Limited by 50%  Left rotation Limited by 50%   (Blank rows = not tested)  LOWER EXTREMITY ROM:  WFL  LOWER EXTREMITY MMT: Bil hip grossly 3+/5, knees 5/5  PALPATION:   General  TTP upper rt abdominal quadrant, fascial restrictions throughout, tight bil lumbar and thoracic paraspinals and gluteals.                External Perineal Exam pt does have  excess skin present at rectum/EAS and does report she has had history of hemorrhoids                              Internal Pelvic Floor no pain reported by pt   Patient confirms identification and approves PT to assess internal pelvic floor and treatment No  06/12/23 yes No emotional/communication barriers or cognitive limitation. Patient is motivated to learn. Patient understands and agrees with treatment goals and plan. PT explains patient will be examined in standing, sitting, and lying down to see how their muscles and joints work. When they are ready, they will be asked to remove their underwear so PT can examine their perineum. The patient is also given the option of providing their own chaperone as one is not provided in our facility. The patient also has the right and is explained the right to defer or refuse any part of the evaluation or treatment including the internal  exam. With the patient's consent, PT will use one gloved finger to gently assess the muscles of the pelvic floor, seeing how well it contracts and relaxes and if there is muscle symmetry. After, the patient will get dressed and PT and patient will discuss exam findings and plan of care. PT and patient discuss plan of care, schedule, attendance policy and HEP activities.   PELVIC MMT:   MMT 06/12/23   Vaginal   Internal Anal Sphincter 3/5  External Anal Sphincter 3/5  Puborectalis 3/5  Diastasis Recti   (Blank rows = not tested)        TONE: Decreased   PROLAPSE: Not seen with rectal assessment of pelvic floor 06/12/23   TODAY'S TREATMENT:                                                                                                                              DATE:    05/15/23: Abdominal massage - PT completed with pt in supine, pt demonstrated incorrect technique - PT completed x10 CW/CCW then pt demonstrated correct.  Counter cat/cow x10 - max cues for technique Counter needle thread x5 20s each Lumbar rotation x10 5s holds each Sidelying open books 3x30s Seated pelvic floor contractions with towel roll - 3sx10 2 sets Pt reports unable to hold contraction for 5s, confirmed 3s isometric but did need towel roll for sensation of pelvic floor contraction. Reports unable to feel if she was contracting without it.  06/12/23  Patient consented to internal pelvic floor assessment rectally this date and found to have decreased strength, endurance, and coordination. Patient benefited from verbal cues for improved technique with pelvic floor contractions. 3x10 pelvic floor contractions, x10 8s isometrics, no pain per pt and no tension but does demonstrate weakness initially at 1/5 but improved to 3/5 with cues and better understanding of correct technique.  HEP updated to include pelvic floor contractions and isometrics to 8-10s holds.  PATIENT EDUCATION:  Education details:  [id number] Person educated: Patient Education method: Explanation, Demonstration, Tactile cues, Verbal cues, and Handouts Education comprehension: verbalized understanding and returned demonstration  HOME EXERCISE PROGRAM: [id number]  ASSESSMENT:  CLINICAL IMPRESSION: Patient presents for treatment, focus on internal rectal assessment of pelvic floor with findings above. Pt does have decreased strength and endurance but does complete of contraction correctly with minimal cues and with these cues demonstrated improved strength to 3/5. Pt would benefit from additional PT to further address deficits.      OBJECTIVE IMPAIRMENTS: decreased coordination, decreased endurance, decreased strength, increased fascial restrictions, increased muscle spasms, impaired flexibility, improper body mechanics, postural dysfunction, and pain.   ACTIVITY LIMITATIONS: continence  PARTICIPATION LIMITATIONS: community activity, occupation, and church  PERSONAL FACTORS: Time since onset of injury/illness/exacerbation and 1 comorbidity: medical history  are also affecting patient's functional outcome.   REHAB POTENTIAL: Good  CLINICAL DECISION MAKING: Stable/uncomplicated  EVALUATION COMPLEXITY: Low   GOALS: Goals reviewed with patient? Yes  SHORT TERM GOALS: Target date: 04/19/23  Pt to be I with HEP.  Baseline: Goal status: on going  2.  Pt will report her BMs are complete due to improved bowel habits and evacuation techniques at least 50% of the time.  Baseline:  Goal status: on going - improving but no consistent yet  3.  Pt to be I with abdominal massage and voiding mechanics for more complete bowel movements.  Baseline:  Goal status: on going - needed cues today 05/15/23    LONG TERM GOALS: Target date: 05/23/23  Pt to be I with advanced HEP.  Baseline:  Goal status: on going  2.  Pt will report her BMs are complete due to improved bowel habits and evacuation techniques at least 75% of the  time.  Baseline:  Goal status: on going  3.  Pt to demonstrate at least 4/5 pelvic floor strength for improved pelvic stability and decreased strain at pelvic floor/ decrease leakage.  Baseline:  Goal status: pt declined assessment 05/15/23   4.  Pt to demonstrate improved coordination of pelvic floor and breathing mechanics with 10# squat with appropriate synergistic patterns to decrease pain and leakage at least 75% of the time.    Baseline:  Goal status: on going  5.  Pt to demonstrate at least 4+/5 bil hip strength for improved pelvic stability and functional squats without leakage.  Baseline:  Goal status: on going    PLAN:  PT FREQUENCY: every other week  PT DURATION:  6 sessions  PLANNED INTERVENTIONS: Therapeutic exercises, Therapeutic activity, Neuromuscular re-education, Patient/Family education, Self Care, Joint mobilization, Electrical stimulation, Spinal mobilization, Cryotherapy, Moist heat, scar mobilization, Taping, Traction, Biofeedback, Manual therapy, and Re-evaluation  PLAN FOR NEXT SESSION: internal as needed and pt consents; core and hip strengthening, stretching hips and spine, breathing mechanics, voiding mechanics, posture training, coordination of pelvic floor and breathing with exercises  Otelia Sergeant, PT, DPT 10/01/244:48 PM

## 2023-06-26 ENCOUNTER — Ambulatory Visit: Payer: 59 | Admitting: Physical Therapy

## 2023-06-26 DIAGNOSIS — R293 Abnormal posture: Secondary | ICD-10-CM

## 2023-06-26 DIAGNOSIS — M6281 Muscle weakness (generalized): Secondary | ICD-10-CM | POA: Diagnosis not present

## 2023-06-26 DIAGNOSIS — R279 Unspecified lack of coordination: Secondary | ICD-10-CM | POA: Diagnosis not present

## 2023-06-26 NOTE — Patient Instructions (Signed)
Types of Fiber  There are two main types of fiber:  insoluble and soluble.  Both of these types can prevent and relieve constipation and diarrhea, although some people find one or the other to be more easily digested.  This handout details information about both types of fiber. recommended 25-35 grams of fiber per day,  average 9-12 grams per meal   key is a balance between soluble and insoluble  Insoluble Fiber        Functions of Insoluble Fiber moves bulk through the intestines  controls and balances the pH (acidity) in the intestines   This type of fiber should be avoided or reduced if you have soft, frequent bowel movements or leakage      Benefits of Insoluble Fiber promotes regular bowel movement and prevents constipation  removes fecal waste through colon in less time  keeps an optimal pH in intestines to prevent microbes from producing cancer substances, therefore preventing colon cancer        Food Sources of Insoluble Fiber whole-wheat products  wheat bran "miller's bran" corn bran  flax seed or other seeds vegetables such as green beans, broccoli, cauliflower and potato skins  fruit skins and root vegetable skins  popcorn brown rice  Soluble Fiber( Types 5,6,7)       Functions of Soluble Fiber  holds water in the colon to bulk and soften the stool prolongs stomach emptying time so that sugar is released and absorbed more slowly  prevent leakage associated with soft, frequent bowel movements.        Benefits of Soluble Fiber lowers total cholesterol and LDL cholesterol (the bad cholesterol) therefore reducing the risk of heart disease  regulates blood sugar for people with diabetes       Food Sources of Soluble Fiber oat/oat bran dried beans and peas  nuts  barley  flax seed or other seeds fruits such as oranges, pears, peaches, and apples  vegetables such as carrots  psyllium husk  prunes

## 2023-06-26 NOTE — Therapy (Signed)
OUTPATIENT PHYSICAL THERAPY FEMALE PELVIC TREATMENT   Patient Name: Rebekah Gutierrez MRN: [id number] DOB:1946-07-07, 77 y.o., female Today's Date: 06/26/2023 Progress Note     END OF SESSION:  PT End of Session - 06/26/23 1359     Visit Number 4    Date for PT Re-Evaluation 07/15/23    Authorization Type [employer] MCR    Progress Note Due on Visit 13    PT Start Time 1400    PT Stop Time 1440    PT Time Calculation (min) 40 min    Activity Tolerance Patient tolerated treatment well    Behavior During Therapy WFL for tasks assessed/performed             Past Medical History:  Diagnosis Date   Diabetes (HCC)    Encounter for screening colonoscopy 06/05/2012   GERD (gastroesophageal reflux disease)    Hiatal hernia    Hypertension    Vertigo    Past Surgical History:  Procedure Laterality Date   BACK SURGERY     L4-5 PLIF   BLADDER SURGERY     CERVICAL SPINE SURGERY     fusion   CHOLECYSTECTOMY, LAPAROSCOPIC     Remote Past   COLONOSCOPY  06/20/2012   Procedure: COLONOSCOPY;  Surgeon: Malissa Hippo, MD;  Location: [facility];  Service: Endoscopy;  Laterality: N/A;  250   COLONOSCOPY WITH PROPOFOL N/A 07/26/2021   Procedure: COLONOSCOPY WITH PROPOFOL;  Surgeon: Dolores Frame, MD;  Location: [facility];  Service: Gastroenterology;  Laterality: N/A;  9:25   FOOT SURGERY     left    HAND SURGERY     left   INCISIONAL HERNIA REPAIR N/A 05/26/2020   Procedure: Mena Pauls WITH MESH;  Surgeon: Franky Macho, MD;  Location: [facility];  Service: General;  Laterality: N/A;   POLYPECTOMY  07/26/2021   Procedure: POLYPECTOMY;  Surgeon: Dolores Frame, MD;  Location: [facility];  Service: Gastroenterology;;   TUBAL LIGATION     Patient Active Problem List   Diagnosis Date Noted   Grade I hemorrhoids 05/08/2023   Diarrhea 01/16/2023   Pelvic floor dysfunction in female 01/16/2023   Anal sphincter spasm 04/18/2022   Loose stools  04/18/2022   Incisional hernia, without obstruction or gangrene    Aortic atherosclerosis (HCC) 01/05/2020   Ventral hernia 06/09/2019   Hypertriglyceridemia 04/21/2019   Multiple lung nodules on CT 04/30/2017   Spinal stenosis of lumbar region 05/02/2016   DDD (degenerative disc disease), lumbar 05/02/2016   Constipation 06/15/2015   OAB (overactive bladder) 11/25/2014   Pain in the chest 09/02/2014   Nonspecific abnormal electrocardiogram (ECG) (EKG) 09/02/2014   Chest pain at rest 09/02/2014   Dizziness    Type 2 diabetes mellitus with hyperglycemia (HCC) 01/21/2014   Incontinence of feces with fecal urgency 01/21/2014   B12 deficiency 02/06/2013   Shoulder pain 08/23/2012   Leg cramps 04/29/2012   Vertigo, benign positional 01/28/2012   Dyspepsia 11/29/2011   Insomnia 11/29/2011   Tobacco user 11/29/2011   Essential hypertension, benign 11/27/2011    PCP: Nita Sells, MD  REFERRING PROVIDER: Raquel James, NP    REFERRING DIAG: R15.9,R15.2 (ICD-10-CM) - Incontinence of feces with fecal urgency  THERAPY DIAG:  Muscle weakness (generalized)  Abnormal posture  Unspecified lack of coordination  Rationale for Evaluation and Treatment: Rehabilitation  ONSET DATE: 10/2022  SUBJECTIVE:  SUBJECTIVE STATEMENT: Pt reports since last Thursday has been having loose-liquid stools and having more leakage overall. Has been unable to stop it once it starts. Changed clothing and sheets once Thursday and needed to change diapers a couple of times throughout the night, feels like she had a muscle spasm and felt really tight at rectum worse at left side. Did decrease fiber to one tablespoon instead of two-three daily and rep rots she will go back to this.   Fluid intake: Yes: water - 3 bottles per day;  coffee cup in am; 3 glasses of tea a day, sprite daily as well     PAIN:  Are you having pain? No - reports no longer having pain with bowel movement  NPRS scale: 0/10 Pain location: na  Pain typena Pain description:  na  Aggravating factors: na Relieving factors: na  PRECAUTIONS: None  RED FLAGS: Bowel or bladder incontinence: Yes: see eval for details    WEIGHT BEARING RESTRICTIONS: No  FALLS:  Has patient fallen in last 6 months? 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 DM, GERD, hiatal hernia, HTN, vertigo.    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, questionable underlying hypersensitivity of the rectal area  Sexual abuse: No  BOWEL MOVEMENT: Pain with bowel movement: Yes Type of bowel movement:Type (Bristol Stool Scale) usually 6-7 sometimes 3, Frequency 2-3 in morning/ 1 in evening daily, and Strain No Fully empty rectum: No Leakage: Yes: sometimes small sometimes a lot  and not always with an urge to go  Pads: Yes: pull ups changed 3 in morning and 1-2x in evening  Fiber supplement: Yes: fiber therapy  URINATION: Pain with urination: No Fully empty bladder: No Stream: Strong Urgency: No Frequency: every 2-4 hours, 1x sometimes at night Leakage:  none Pads: No  INTERCOURSE: Pain with intercourse:  not active Ability to have vaginal penetration:  Yes:   Climax: not active Marinoff Scale: 0/3  PREGNANCY: Vaginal deliveries 2 Tearing No C-section deliveries 0 Currently pregnant No  PROLAPSE: Sometimes heaviness felt in rectum    OBJECTIVE:   DIAGNOSTIC  FINDINGS:    COGNITION: Overall cognitive status: Within functional limits for tasks assessed     SENSATION: Light touch: Deficits numbness, tingling in bil legs into feet Proprioception: Appears intact  MUSCLE LENGTH: Bil hamstring and adductors limited by 50%    GAIT: Distance walked: 300' Assistive device utilized: None Level of assistance: Complete Independence Comments: rounded shoulders, forward head, decreased arm swing and trunk rotation  POSTURE: rounded shoulders, forward head, posterior pelvic tilt, and flexed trunk   PELVIC ALIGNMENT:WFL  LUMBARAROM/PROM:  A/PROM A/PROM  eval  Flexion Limited by 50%  Extension Limited by 25%  Right lateral flexion Limited by 75%  Left lateral flexion Limited by 75%  Right rotation Limited by 50%  Left rotation Limited by 50%   (Blank rows = not tested)  LOWER EXTREMITY ROM:  WFL  LOWER EXTREMITY MMT: Bil hip grossly 3+/5, knees 5/5  PALPATION:   General  TTP upper rt abdominal quadrant, fascial restrictions throughout, tight bil lumbar and thoracic paraspinals and gluteals.  External Perineal Exam pt does have excess skin present at rectum/EAS and does report she has had history of hemorrhoids                              Internal Pelvic Floor no pain reported by pt   Patient confirms identification and approves PT to assess internal pelvic floor and treatment No  06/12/23 yes No emotional/communication barriers or cognitive limitation. Patient is motivated to learn. Patient understands and agrees with treatment goals and plan. PT explains patient will be examined in standing, sitting, and lying down to see how their muscles and joints work. When they are ready, they will be asked to remove their underwear so PT can examine their perineum. The patient is also given the option of providing their own chaperone as one is not provided in our facility. The patient also has the right and is explained the right  to defer or refuse any part of the evaluation or treatment including the internal exam. With the patient's consent, PT will use one gloved finger to gently assess the muscles of the pelvic floor, seeing how well it contracts and relaxes and if there is muscle symmetry. After, the patient will get dressed and PT and patient will discuss exam findings and plan of care. PT and patient discuss plan of care, schedule, attendance policy and HEP activities.   PELVIC MMT:   MMT 06/12/23   Vaginal   Internal Anal Sphincter 3/5  External Anal Sphincter 3/5  Puborectalis 3/5  Diastasis Recti   (Blank rows = not tested)        TONE: Decreased   PROLAPSE: Not seen with rectal assessment of pelvic floor 06/12/23   TODAY'S TREATMENT:                                                                                                                              DATE:   05/15/23: Abdominal massage - PT completed with pt in supine, pt demonstrated incorrect technique - PT completed x10 CW/CCW then pt demonstrated correct.  Counter cat/cow x10 - max cues for technique Counter needle thread x5 20s each Lumbar rotation x10 5s holds each Sidelying open books 3x30s Seated pelvic floor contractions with towel roll - 3sx10 2 sets Pt reports unable to hold contraction for 5s, confirmed 3s isometric but did need towel roll for sensation of pelvic floor contraction. Reports unable to feel if she was contracting without it.  06/12/23  Patient consented to internal pelvic floor assessment rectally this date and found to have decreased strength, endurance, and coordination. Patient benefited from verbal cues for improved technique with pelvic floor contractions. 3x10 pelvic floor contractions, x10 8s isometrics, no pain per pt and no tension but does demonstrate weakness initially at 1/5 but improved to 3/5 with cues and better understanding of correct technique.  HEP updated to include pelvic floor contractions and  isometrics to 8-10s holds.   06/26/23  Therapeutic activity: Pt had several questions about her increased leakage - time spent reviewing pt's  normal day. Identifying that pt has forgotten her metamucil other than once per day, was taking it 2-3x daily. Also has been eating more salads with lunch or dinner and unsure if this has made it worse.  Bowel retraining educated on for pt to attempt to sit at toilet ~30 mins after each meal, no straining/pushing for 5 minutes to see if bowel movement occurs. If not leave toilet.- pt agreed.  Does report pain at rectum has gotten better but notes when she is having more bowel movements and wiping a lot she has pain here and thinks she is more irritated. -educated to attempt a peri bottle to assist in hygiene to cut down on how much wiping she is doing Pt also given fiber handout and educated on this, also encouraged pt to reach out to referring provider and ask if there is any additional medical intervention. Pt asked PT to message provider as well and this was completed during session.     PATIENT EDUCATION:  Education details: [id number] Person educated: Patient Education method: Explanation, Demonstration, Tactile cues, Verbal cues, and Handouts Education comprehension: verbalized understanding and returned demonstration  HOME EXERCISE PROGRAM: [id number]  ASSESSMENT:  CLINICAL IMPRESSION: Patient presents for treatment, focus on bowel retraining, fiber types, reviewing pt's daily meals/water intake to see if there are patterns to increased leakage. PT also messaged pt's referring provider at pt request for provider to contact pt about medical intervention as pt had questions about this. Pt would benefit from additional PT to further address deficits.      OBJECTIVE IMPAIRMENTS: decreased coordination, decreased endurance, decreased strength, increased fascial restrictions, increased muscle spasms, impaired flexibility, improper body mechanics,  postural dysfunction, and pain.   ACTIVITY LIMITATIONS: continence  PARTICIPATION LIMITATIONS: community activity, occupation, and church  PERSONAL FACTORS: Time since onset of injury/illness/exacerbation and 1 comorbidity: medical history  are also affecting patient's functional outcome.   REHAB POTENTIAL: Good  CLINICAL DECISION MAKING: Stable/uncomplicated  EVALUATION COMPLEXITY: Low   GOALS: Goals reviewed with patient? Yes  SHORT TERM GOALS: Target date: 04/19/23  Pt to be I with HEP.  Baseline: Goal status: on going  2.  Pt will report her BMs are complete due to improved bowel habits and evacuation techniques at least 50% of the time.  Baseline:  Goal status: on going - improving but no consistent yet  3.  Pt to be I with abdominal massage and voiding mechanics for more complete bowel movements.  Baseline:  Goal status: on going - needed cues today 05/15/23    LONG TERM GOALS: Target date: 05/23/23  Pt to be I with advanced HEP.  Baseline:  Goal status: on going  2.  Pt will report her BMs are complete due to improved bowel habits and evacuation techniques at least 75% of the time.  Baseline:  Goal status: on going  3.  Pt to demonstrate at least 4/5 pelvic floor strength for improved pelvic stability and decreased strain at pelvic floor/ decrease leakage.  Baseline:  Goal status: pt declined assessment 05/15/23   4.  Pt to demonstrate improved coordination of pelvic floor and breathing mechanics with 10# squat with appropriate synergistic patterns to decrease pain and leakage at least 75% of the time.    Baseline:  Goal status: on going  5.  Pt to demonstrate at least 4+/5 bil hip strength for  improved pelvic stability and functional squats without leakage.  Baseline:  Goal status: on going    PLAN:  PT FREQUENCY: every other week  PT DURATION:  6 sessions  PLANNED INTERVENTIONS: Therapeutic exercises, Therapeutic activity, Neuromuscular  re-education, Patient/Family education, Self Care, Joint mobilization, Electrical stimulation, Spinal mobilization, Cryotherapy, Moist heat, scar mobilization, Taping, Traction, Biofeedback, Manual therapy, and Re-evaluation  PLAN FOR NEXT SESSION: internal as needed and pt consents; core and hip strengthening, stretching hips and spine, breathing mechanics, voiding mechanics, posture training, coordination of pelvic floor and breathing with exercises  Otelia Sergeant, PT, DPT 10/15/242:50 PM

## 2023-07-10 ENCOUNTER — Ambulatory Visit: Payer: 59 | Admitting: Physical Therapy

## 2023-07-10 ENCOUNTER — Encounter: Payer: Self-pay | Admitting: Physical Therapy

## 2023-07-10 DIAGNOSIS — M6281 Muscle weakness (generalized): Secondary | ICD-10-CM

## 2023-07-10 DIAGNOSIS — R279 Unspecified lack of coordination: Secondary | ICD-10-CM

## 2023-07-10 DIAGNOSIS — R293 Abnormal posture: Secondary | ICD-10-CM | POA: Diagnosis not present

## 2023-07-10 NOTE — Therapy (Signed)
OUTPATIENT PHYSICAL THERAPY FEMALE PELVIC TREATMENT   Patient Name: Rebekah Gutierrez MRN: [id number] DOB:03-May-1946, 77 y.o., female Today's Date: 07/10/2023 Progress Note     END OF SESSION:  PT End of Session - 07/10/23 1402     Visit Number 5    Date for PT Re-Evaluation 11/09/23    Authorization Type [employer] MCR    Progress Note Due on Visit 13    PT Start Time 1400    PT Stop Time 1443    PT Time Calculation (min) 43 min    Activity Tolerance Patient tolerated treatment well    Behavior During Therapy WFL for tasks assessed/performed             Past Medical History:  Diagnosis Date   Diabetes (HCC)    Encounter for screening colonoscopy 06/05/2012   GERD (gastroesophageal reflux disease)    Hiatal hernia    Hypertension    Vertigo    Past Surgical History:  Procedure Laterality Date   BACK SURGERY     L4-5 PLIF   BLADDER SURGERY     CERVICAL SPINE SURGERY     fusion   CHOLECYSTECTOMY, LAPAROSCOPIC     Remote Past   COLONOSCOPY  06/20/2012   Procedure: COLONOSCOPY;  Surgeon: Malissa Hippo, MD;  Location: [facility];  Service: Endoscopy;  Laterality: N/A;  250   COLONOSCOPY WITH PROPOFOL N/A 07/26/2021   Procedure: COLONOSCOPY WITH PROPOFOL;  Surgeon: Dolores Frame, MD;  Location: [facility];  Service: Gastroenterology;  Laterality: N/A;  9:25   FOOT SURGERY     left    HAND SURGERY     left   INCISIONAL HERNIA REPAIR N/A 05/26/2020   Procedure: Mena Pauls WITH MESH;  Surgeon: Franky Macho, MD;  Location: [facility];  Service: General;  Laterality: N/A;   POLYPECTOMY  07/26/2021   Procedure: POLYPECTOMY;  Surgeon: Dolores Frame, MD;  Location: [facility];  Service: Gastroenterology;;   TUBAL LIGATION     Patient Active Problem List   Diagnosis Date Noted   Grade I hemorrhoids 05/08/2023   Diarrhea 01/16/2023   Pelvic floor dysfunction in female 01/16/2023   Anal sphincter spasm 04/18/2022   Loose stools  04/18/2022   Incisional hernia, without obstruction or gangrene    Aortic atherosclerosis (HCC) 01/05/2020   Ventral hernia 06/09/2019   Hypertriglyceridemia 04/21/2019   Multiple lung nodules on CT 04/30/2017   Spinal stenosis of lumbar region 05/02/2016   DDD (degenerative disc disease), lumbar 05/02/2016   Constipation 06/15/2015   OAB (overactive bladder) 11/25/2014   Pain in the chest 09/02/2014   Nonspecific abnormal electrocardiogram (ECG) (EKG) 09/02/2014   Chest pain at rest 09/02/2014   Dizziness    Type 2 diabetes mellitus with hyperglycemia (HCC) 01/21/2014   Incontinence of feces with fecal urgency 01/21/2014   B12 deficiency 02/06/2013   Shoulder pain 08/23/2012   Leg cramps 04/29/2012   Vertigo, benign positional 01/28/2012   Dyspepsia 11/29/2011   Insomnia 11/29/2011   Tobacco user 11/29/2011   Essential hypertension, benign 11/27/2011    PCP: Nita Sells, MD  REFERRING PROVIDER: Raquel James, NP    REFERRING DIAG: R15.9,R15.2 (ICD-10-CM) - Incontinence of feces with fecal urgency  THERAPY DIAG:  Muscle weakness (generalized)  Abnormal posture  Unspecified lack of coordination  Rationale for Evaluation and Treatment: Rehabilitation  ONSET DATE: 10/2022  SUBJECTIVE:  SUBJECTIVE STATEMENT: Pt reports she has been having firmer bowel movements, had one day with more leakage and frequency but on average going 2x daily and reports some leakage with looser stools. Has not been taking metamucil lately but has ben eating more fiber foods.  One day last week had a loss of bowels with no urge of needing to and this has not been happening as often recently but did once this past week.  Fluid intake: Yes: water - 3 bottles per day; coffee cup in am; 3 glasses of tea a day, sprite  daily as well     PAIN:  Are you having pain? No - reports no longer having pain with bowel movement  NPRS scale: 0/10 Pain location: na  Pain type na Pain description:  na  Aggravating factors: na Relieving factors: na  PRECAUTIONS: None  RED FLAGS: Bowel or bladder incontinence: Yes: see eval for details    WEIGHT BEARING RESTRICTIONS: No  FALLS:  Has patient fallen in last 6 months? 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 DM, GERD, hiatal hernia, HTN, vertigo.    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, questionable underlying hypersensitivity of the rectal area  Sexual abuse: No  BOWEL MOVEMENT: Pain with bowel movement: Yes Type of bowel movement:Type (Bristol Stool Scale) usually 6-7 sometimes 3, Frequency 2-3 in morning/ 1 in evening daily, and Strain No Fully empty rectum: No Leakage: Yes: sometimes small sometimes a lot  and not always with an urge to go  Pads: Yes: pull ups changed 3 in morning and 1-2x in evening  Fiber supplement: Yes: fiber therapy  URINATION: Pain with urination: No Fully empty bladder: No Stream: Strong Urgency: No Frequency: every 2-4 hours, 1x sometimes at night Leakage:  none Pads: No  INTERCOURSE: Pain with intercourse:  not active Ability to have vaginal penetration:  Yes:   Climax: not active Marinoff Scale: 0/3  PREGNANCY: Vaginal deliveries 2 Tearing No C-section deliveries 0 Currently pregnant No  PROLAPSE: Sometimes heaviness felt in rectum    OBJECTIVE:   DIAGNOSTIC FINDINGS:    COGNITION: Overall cognitive status:  Within functional limits for tasks assessed     SENSATION: Light touch: Deficits numbness, tingling in bil legs into feet Proprioception: Appears intact  MUSCLE LENGTH: Bil hamstring and adductors limited by 50%    GAIT: Distance walked: 300' Assistive device utilized: None Level of assistance: Complete Independence Comments: rounded shoulders, forward head, decreased arm swing and trunk rotation  POSTURE: rounded shoulders, forward head, posterior pelvic tilt, and flexed trunk   PELVIC ALIGNMENT:WFL  LUMBARAROM/PROM:  A/PROM A/PROM  eval  Flexion Limited by 50%  Extension Limited by 25%  Right lateral flexion Limited by 75%  Left lateral flexion Limited by 75%  Right rotation Limited by 50%  Left rotation Limited by 50%   (Blank rows = not tested)  LOWER EXTREMITY ROM:  WFL  LOWER EXTREMITY MMT: Bil hip grossly 3+/5, knees 5/5  PALPATION:   General  TTP upper rt abdominal quadrant, fascial restrictions throughout, tight bil lumbar and thoracic paraspinals and gluteals.  External Perineal Exam pt does have excess skin present at rectum/EAS and does report she has had history of hemorrhoids                              Internal Pelvic Floor no pain reported by pt   Patient confirms identification and approves PT to assess internal pelvic floor and treatment No  06/12/23 yes No emotional/communication barriers or cognitive limitation. Patient is motivated to learn. Patient understands and agrees with treatment goals and plan. PT explains patient will be examined in standing, sitting, and lying down to see how their muscles and joints work. When they are ready, they will be asked to remove their underwear so PT can examine their perineum. The patient is also given the option of providing their own chaperone as one is not provided in our facility. The patient also has the right and is explained the right to defer or refuse any part of the evaluation or  treatment including the internal exam. With the patient's consent, PT will use one gloved finger to gently assess the muscles of the pelvic floor, seeing how well it contracts and relaxes and if there is muscle symmetry. After, the patient will get dressed and PT and patient will discuss exam findings and plan of care. PT and patient discuss plan of care, schedule, attendance policy and HEP activities.   PELVIC MMT:   MMT 06/12/23  07/10/23   Vaginal    Internal Anal Sphincter 3/5 3/5  External Anal Sphincter 3/5 3/5, 20s, 6 reps  Puborectalis 3/5 3/5  Diastasis Recti    (Blank rows = not tested)        TONE: Decreased   PROLAPSE: Not seen with rectal assessment of pelvic floor 06/12/23   TODAY'S TREATMENT:                                                                                                                              DATE:   06/12/23  Patient consented to internal pelvic floor assessment rectally this date and found to have decreased strength, endurance, and coordination. Patient benefited from verbal cues for improved technique with pelvic floor contractions. 3x10 pelvic floor contractions, x10 8s isometrics, no pain per pt and no tension but does demonstrate weakness initially at 1/5 but improved to 3/5 with cues and better understanding of correct technique.  HEP updated to include pelvic floor contractions and isometrics to 8-10s holds.   06/26/23  Therapeutic activity: Pt had several questions about her increased leakage - time spent reviewing pt's  normal day. Identifying that pt has forgotten her metamucil other than once per day, was taking it 2-3x daily. Also has been eating more salads with lunch or dinner and unsure if this has made it worse.  Bowel retraining educated on for pt to attempt to sit at toilet ~30 mins after each meal, no straining/pushing for 5  minutes to see if bowel movement occurs. If not leave toilet.- pt agreed.  Does report pain at rectum has  gotten better but notes when she is having more bowel movements and wiping a lot she has pain here and thinks she is more irritated. -educated to attempt a peri bottle to assist in hygiene to cut down on how much wiping she is doing Pt also given fiber handout and educated on this, also encouraged pt to reach out to referring provider and ask if there is any additional medical intervention. Pt asked PT to message provider as well and this was completed during session.   07/10/23: Patient consented to internal pelvic floor treatment rectally this date and found to have improvement with pelvic floor strength, endurance, and coordination. Though deficits still present. Details above. patient benefited from verbal cues for improved tighten as needed and initially x10 quick release to improve contractions. 3x10 pelvic floor contractions, x10 20-25s isometrics, no pain per pt and no tension, 2x10 quick contractions. Pt educated on findings, HEP updated to include quick flicks, increased time for isometrics at home, and to progress to doing pelvic floor contraction exercises in standing. Verbalized understanding and given handout.   PATIENT EDUCATION:  Education details: [id number] Person educated: Patient Education method: Explanation, Demonstration, Tactile cues, Verbal cues, and Handouts Education comprehension: verbalized understanding and returned demonstration  HOME EXERCISE PROGRAM: [id number]  ASSESSMENT:  CLINICAL IMPRESSION: Patient presents for treatment, focus on brief review of bowel retraining and to attempt to empty bowels after meals for 5 mins at toilet to improve bowel habits. Pt consented to internal treatment today, rectal treatment completed and demonstrated improvements noted above. PT also messaged pt's referring provider at pt request for provider to contact pt about medical intervention as pt had questions about this. Pt would benefit from additional PT to further address deficits  with pt having progress but slowly due to difficulty recalling new information and benefits from multiple reps to recall.      OBJECTIVE IMPAIRMENTS: decreased coordination, decreased endurance, decreased strength, increased fascial restrictions, increased muscle spasms, impaired flexibility, improper body mechanics, postural dysfunction, and pain.   ACTIVITY LIMITATIONS: continence  PARTICIPATION LIMITATIONS: community activity, occupation, and church  PERSONAL FACTORS: Time since onset of injury/illness/exacerbation and 1 comorbidity: medical history  are also affecting patient's functional outcome.   REHAB POTENTIAL: Good  CLINICAL DECISION MAKING: Stable/uncomplicated  EVALUATION COMPLEXITY: Low   GOALS: Goals reviewed with patient? Yes  SHORT TERM GOALS: Target date: 04/19/23  Pt to be I with HEP.  Baseline: Goal status:MET  2.  Pt will report her BMs are complete due to improved bowel habits and evacuation techniques at least 50% of the time.  Baseline:  Goal status: MET  3.  Pt to be I with abdominal massage and voiding mechanics for more complete bowel movements.  Baseline:  Goal status: MET   LONG TERM GOALS: Target date: 05/23/23  Pt to be I with advanced HEP.  Baseline:  Goal status: on going  2.  Pt will report her BMs are complete due to improved bowel habits and evacuation techniques at least 75% of the time.  Baseline:  Goal status: MET  3.  Pt to demonstrate at least 4/5 pelvic floor strength for improved pelvic stability and decreased strain at pelvic floor/ decrease leakage.  Baseline:  Goal status: on going  4.  Pt to demonstrate improved coordination of pelvic floor and breathing mechanics with 10# squat with appropriate synergistic patterns to decrease  pain and leakage at least 75% of the time.    Baseline:  Goal status: on going  5.  Pt to demonstrate at least 4+/5 bil hip strength for improved pelvic stability and functional squats without  leakage.  Baseline:  Goal status: on going    PLAN:  PT FREQUENCY: 1x/week  PT DURATION:  8 sessions  PLANNED INTERVENTIONS: Therapeutic exercises, Therapeutic activity, Neuromuscular re-education, Patient/Family education, Self Care, Joint mobilization, Electrical stimulation, Spinal mobilization, Cryotherapy, Moist heat, scar mobilization, Taping, Traction, Biofeedback, Manual therapy, and Re-evaluation  PLAN FOR NEXT SESSION: internal as needed and pt consents; core and hip strengthening, stretching hips and spine, breathing mechanics, voiding mechanics, posture training, coordination of pelvic floor and breathing with exercises  Otelia Sergeant, PT, DPT 10/29/242:47 PM

## 2023-07-24 ENCOUNTER — Encounter (INDEPENDENT_AMBULATORY_CARE_PROVIDER_SITE_OTHER): Payer: Self-pay | Admitting: Gastroenterology

## 2023-07-24 ENCOUNTER — Ambulatory Visit (INDEPENDENT_AMBULATORY_CARE_PROVIDER_SITE_OTHER): Payer: 59 | Admitting: Gastroenterology

## 2023-07-24 VITALS — BP 122/73 | HR 82 | Temp 98.9°F | Ht 60.0 in | Wt 147.7 lb

## 2023-07-24 DIAGNOSIS — R197 Diarrhea, unspecified: Secondary | ICD-10-CM

## 2023-07-24 DIAGNOSIS — K649 Unspecified hemorrhoids: Secondary | ICD-10-CM | POA: Diagnosis not present

## 2023-07-24 DIAGNOSIS — M9905 Segmental and somatic dysfunction of pelvic region: Secondary | ICD-10-CM | POA: Diagnosis not present

## 2023-07-24 DIAGNOSIS — R6881 Early satiety: Secondary | ICD-10-CM | POA: Diagnosis not present

## 2023-07-24 DIAGNOSIS — K921 Melena: Secondary | ICD-10-CM | POA: Insufficient documentation

## 2023-07-24 NOTE — Patient Instructions (Signed)
Please resume benefiber 1T 2-3 times per day with a meal and make sure to drink plenty of water We will schedule you for upper endoscopy for further evaluation of black stools You can continue with dicyclomine twice daily for your looser stools and rectal spasms Continue with anusol cream up to three times per day, if you decide you would like to try the [facility] hemorrhoid cream, please let me know  Follow up 3 months  It was a pleasure to see you today. I want to create trusting relationships with patients and provide genuine, compassionate, and quality care. I truly value your feedback! please be on the lookout for a survey regarding your visit with me today. I appreciate your input about our visit and your time in completing this!    Oree Hislop L. Jeanmarie Hubert, MSN, APRN, AGNP-C Adult-Gerontology Nurse Practitioner [facility] at [facility]

## 2023-07-24 NOTE — Progress Notes (Addendum)
Referring Provider: Benita Stabile, MD Primary Care Physician:  Benita Stabile, MD Primary GI Physician: Dr. Levon Hedger   Chief Complaint  Patient presents with   Diarrhea    Follow up on diarrhea. Mostly every morning and sometimes in the evenings. Takes dicyclomine but not helping.    HPI:   Rebekah Gutierrez is a 77 y.o. female with past medical history of  DM, GERD, hiatal hernia, HTN, vertigo.    Patient presenting today for follow up of diarrhea, hemorrhoids, also with melena  Last seen August 2024, at that time having 1 bowel movement per day usually, sometimes 2-3 bowel movements per day.  Stools softer to looser but no more watery diarrhea.  Still with some fecal urgency/incontinence.  Taking dicyclomine which helps some twice daily.  Has some rectal pain at times which dicyclomine seems to help.  Occasional toilet tissue hematochezia.  Recommended to continue to follow with PFT, continue dicyclomine BID, anusol TID, restart benefiber, good water intake  Present: States having 1-3 episodes of looser stools/diarrhea per day. Notes that she started having more watery stools a couple of weeks ago. She is still having some fecal urgency and incontinence. She notes that stools had returned almost to baseline a while back. She has tried benefiber in the past with good results. Notes she has been off of this for about 1 months. No other medication changes or new meds. She is taking dicyclomine BID which seems to help some. She denies any abdominal pain but notes continued noisiness in her abdomen. She still has pain/spasms in rectum at times. She is still seeing pelvic floor therapist and feels this has helped some. Has some intermittent toilet tissue hematochezia as well as rectal burning and itching. She is using A&D ointment, has anusol but has not used it recently.   Feels appetite is okay, weight is stable. She does endorse some early satiety at times. Denies any nausea or vomiting. She notes  a couple of episodes of black stools, last was last week. She had no associated abdominal pain. Denies NSAIDs other than 325mg  ASA she is maintained on. 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 -July 2024 Saw Pelvic floor therapist initially i -CMP 05/2023 normal potassium, sodium, calcium 8.5 -last TSH 2021 0.94 ( has been normal for the past 11 years)  Last Colonoscopy:Nov 2022- Two 4 to 8 mm polyps in the proximal rectum, One 3 mm polyp in the proximal transverse colon. - Multiple 3 to 6 mm polyps in the rectum. - Diverticulosis in the entire examined colon. - The distal rectum and anal verge are normal on retroflexion view.   Repeat colonoscopy in nov 2025 Last EGD: 2012  Past Medical History:  Diagnosis Date   Diabetes [facility])    Encounter for screening colonoscopy 06/05/2012   GERD (gastroesophageal reflux disease)    Hiatal hernia    Hypertension    Vertigo     Past Surgical History:  Procedure Laterality Date   BACK SURGERY     L4-5 PLIF   BLADDER SURGERY     CERVICAL SPINE SURGERY     fusion   CHOLECYSTECTOMY, LAPAROSCOPIC     Remote Past   COLONOSCOPY  06/20/2012   Procedure: COLONOSCOPY;  Surgeon: Joline Maxcy  Karilyn Cota, MD;  Location: [facility];  Service: Endoscopy;  Laterality: N/A;  250   COLONOSCOPY WITH PROPOFOL N/A 07/26/2021   Procedure: COLONOSCOPY WITH PROPOFOL;  Surgeon: Dolores Frame, MD;  Location: [facility];  Service: Gastroenterology;  Laterality: N/A;  9:25   FOOT SURGERY     left    HAND SURGERY     left   INCISIONAL HERNIA REPAIR N/A 05/26/2020   Procedure: Mena Pauls WITH MESH;  Surgeon: Franky Macho, MD;  Location: [facility];  Service: General;  Laterality: N/A;    POLYPECTOMY  07/26/2021   Procedure: POLYPECTOMY;  Surgeon: Marguerita Merles, Reuel Boom, MD;  Location: [facility];  Service: Gastroenterology;;   TUBAL LIGATION      Current Outpatient Medications  Medication Sig Dispense Refill   acetaminophen (TYLENOL) 500 MG tablet Take 1,000 mg by mouth every 6 (six) hours as needed for moderate pain.     albuterol (VENTOLIN HFA) 108 (90 Base) MCG/ACT inhaler Inhale 2 puffs into the lungs every 6 (six) hours as needed for wheezing or shortness of breath. 8 g 0   ALPHAGAN P 0.1 % SOLN Place 1 drop into both eyes 2 (two) times daily.     amLODipine (NORVASC) 10 MG tablet Take 1 tablet (10 mg total) by mouth daily. 90 tablet 3   Apoaequorin (PREVAGEN PO) Take 1 tablet by mouth daily.     aspirin 325 MG EC tablet Take 325 mg by mouth daily.     atorvastatin (LIPITOR) 20 MG tablet Take 0.5 tablets (10 mg total) by mouth daily. 90 tablet 3   Calcium Carb-Cholecalciferol (CALCIUM 1000 + D PO) Take 1 tablet by mouth daily.     Cinnamon 500 MG TABS Take 500 mg by mouth at bedtime.      dicyclomine (BENTYL) 10 MG capsule TAKE 1 CAPSULE BY MOUTH TWICE DAILY AS NEEDED FOR SPASM 60 capsule 1   dorzolamide-timolol (COSOPT) 22.3-6.8 MG/ML ophthalmic solution Place 1 drop into both eyes 2 (two) times daily.     FARXIGA 10 MG TABS tablet Take 10 mg by mouth daily.     gabapentin (NEURONTIN) 300 MG capsule TAKE 1 CAPSULE BY MOUTH ONCE DAILY AT BEDTIME FOR BACK PAIN 90 capsule 3   glipiZIDE (GLUCOTROL XL) 10 MG 24 hr tablet Take 1 tablet (10 mg total) by mouth daily with breakfast. 90 tablet 3   glucose blood (ONETOUCH ULTRA) test strip USE 1 STRIP TO CHECK GLUCOSE TWICE DAILY AS DIRECTED 100 each 3   hydrochlorothiazide (HYDRODIURIL) 12.5 MG tablet Take 12.5 mg by mouth daily.     hydrocortisone (ANUSOL-HC) 2.5 % rectal cream Place 1 Application rectally 3 (three) times daily. 56 g 1   Lancets (ONETOUCH DELICA PLUS LANCET33G) MISC USE 1 LANCET TO CHECK GLUCOSE TWICE  DAILY AS DIRECTED 100 each 3   lisinopril (ZESTRIL) 40 MG tablet Take 1 tablet (40 mg total) by mouth daily. 90 tablet 3   Methylcellulose, Laxative, (FIBER THERAPY) 500 MG TABS Take 500 mg by mouth daily.     Multiple Vitamin (MULTIVITAMIN WITH MINERALS) TABS tablet Take 1 tablet by mouth daily.     Omega-3 Fatty Acids (FISH OIL) 1000 MG CAPS tAKE 1000MG  TWICE A DAY FOR CHOLESTEROL (Patient taking differently: Take 1,000 mg by mouth in the morning and at bedtime.)  0   Potassium 99 MG TABS Take 99 mg by mouth daily.     VYZULTA 0.024 % SOLN Place 1 drop into both eyes at  bedtime.      mycophenolate (CELLCEPT) 500 MG tablet Take 1,000 mg by mouth 2 (two) times daily. (Patient not taking: Reported on 07/24/2023)     No current facility-administered medications for this visit.    Allergies as of 07/24/2023   (No Known Allergies)    Family History  Problem Relation Age of Onset   Hypertension Mother    Stroke Mother    Hypertension Father    Cancer Father    Colon cancer Neg Hx     Social History   Socioeconomic History   Marital status: Divorced    Spouse name: Not on file   Number of children: Not on file   Years of education: Not on file   Highest education level: Not on file  Occupational History   Not on file  Tobacco Use   Smoking status: Every Day    Current packs/day: 0.50    Average packs/day: 0.5 packs/day for 45.0 years (22.5 ttl pk-yrs)    Types: Cigarettes    Passive exposure: Current   Smokeless tobacco: Never   Tobacco comments:    1/2 pack a day since age 36  Vaping Use   Vaping status: Never Used  Substance and Sexual Activity   Alcohol use: No    Alcohol/week: 0.0 standard drinks of alcohol   Drug use: No   Sexual activity: Yes    Birth control/protection: Post-menopausal, Surgical  Other Topics Concern   Not on file  Social History Narrative   Not on file   Social Determinants of Health   Financial Resource Strain: Low Risk  (04/15/2020)    Overall Financial Resource Strain (CARDIA)    Difficulty of Paying Living Expenses: Not very hard  Food Insecurity: Not on file  Transportation Needs: Not on file  Physical Activity: Not on file  Stress: Not on file  Social Connections: Not on file   Review of systems General: negative for malaise, night sweats, fever, chills, weight loss Neck: Negative for lumps, goiter, pain and significant neck swelling Resp: Negative for cough, wheezing, dyspnea at rest CV: Negative for chest pain, leg swelling, palpitations, orthopnea GI: denies nausea, vomiting,  constipation, dysphagia, odyonophagia,  or unintentional weight loss. +diarrhea +rectal pain/itching/burn +toilet tissue hematochezia +early satiety, +melena The remainder of the review of systems is noncontributory.  Physical Exam: BP 122/73 (BP Location: Left Arm, Patient Position: Sitting, Cuff Size: Normal)   Pulse 82   Temp 98.9 F (37.2 C) (Oral)   Ht 5' (1.524 m)   Wt 147 lb 11.2 oz (67 kg)   BMI 28.85 kg/m  General:   Alert and oriented. No distress noted. Pleasant and cooperative.  Head:  Normocephalic and atraumatic. Eyes:  Conjuctiva clear without scleral icterus. Mouth:  Oral mucosa pink and moist. Good dentition. No lesions. Heart: Normal rate and rhythm, s1 and s2 heart sounds present.  Lungs: Clear lung sounds in all lobes. Respirations equal and unlabored. Abdomen:  +BS, soft, non-tender and non-distended. No rebound or guarding. No HSM or masses noted. Derm: No palmar erythema or jaundice Msk:  Symmetrical without gross deformities. Normal posture. Extremities:  Without edema. Neurologic:  Alert and  oriented x4 Psych:  Alert and cooperative. Normal mood and affect.  Invalid input(s): "6 MONTHS"   ASSESSMENT: Rebekah Gutierrez is a 76 y.o. female presenting today for follow up of diarrhea and hemorrhoids, also with melena and early satiety   Diarrhea: Diarrhea and loose stools have been intermittent over the past  few years.  Anorectal manometry revealed anorectal dyssynergia for which she is seeing pelvic floor PT for.  She was previously on Benefiber at my recommendation and having more solid stools though notes she has been off this for about a month.  Notes worsening more watery stools and fecal incontinence again over the past couple of weeks.  Recommend she resume Benefiber 1 tablespoon 2-3 times per day with a meal, continue good water intake, continue dicyclomine twice daily.  She should continue to follow with pelvic floor physical therapy as well.  She should let me know if diarrhea does not improve.  Rectal bleeding/hemorrhoids: Treated for hemorrhoids in the past with Anusol, she reports occasional toilet tissue hematochezia with some rectal burning and itching if she has a larger more solid stool.  She is using A&E ointment at times for rectal irritation as well.  Discussed [facility] hemorrhoid cream but given out-of-pocket cost she declined this at this time.  Will she continue to use Anusol cream as needed, can use A&E as needed as well.  Melena/early satiety: Patient noting black stools that began last week.  She also has some early satiety.  Denies any weight loss, nausea, vomiting.  Notably is on 325 mg aspirin daily.  Last hemoglobin in September was within normal limits.  Recommend proceeding with upper endoscopy for further evaluation of her symptoms as I cannot rule out PUD, duodenitis, gastritis.  PLAN:  Restart benefiber 1T 2-3x/day with meal 2. Continue with bentyl 10mg  BID  3. Schedule EGD ASA III 4. Continue anusol, A/D ointment PRN 5. Pt to consider CA hemorrhoid cream, may consider hemorrhoid banding if symptoms persist  All questions were answered, patient verbalized understanding and is in agreement with plan as outlined above.   Follow Up: 3 months   Patt Steinhardt L. Jeanmarie Hubert, MSN, APRN, AGNP-C Adult-Gerontology Nurse Practitioner [facility]   I have  reviewed the note and agree with the APP's assessment as described in this progress note  Katrinka Blazing, MD Gastroenterology and Hepatology [facility]

## 2023-07-25 ENCOUNTER — Encounter (INDEPENDENT_AMBULATORY_CARE_PROVIDER_SITE_OTHER): Payer: Self-pay

## 2023-08-07 ENCOUNTER — Other Ambulatory Visit (INDEPENDENT_AMBULATORY_CARE_PROVIDER_SITE_OTHER): Payer: Self-pay | Admitting: Gastroenterology

## 2023-08-07 NOTE — Telephone Encounter (Signed)
Last seen 07/24/23

## 2023-08-15 NOTE — Patient Instructions (Signed)
Rebekah Gutierrez  08/15/2023     @PREFPERIOPPHARMACY @   Your procedure is scheduled on  08/20/2023.   Report to [facility] at  1100  A.M.   Call this number if you have problems the morning of surgery:  [phone]  If you experience any cold or flu symptoms such as cough, fever, chills, shortness of breath, etc. between now and your scheduled surgery, please notify us at the above number.   Remember:         Your last dose of farxiga should be on 08/16/2023.          Use your inhaler before you come and bring your rescue inhaler with you.        DO NOT take any medications for diabetes the morning of your procedure.   Follow the diet instructions given to you by the office.   You may drink clear liquids until 0900 am on 08/16/2023.    Clear liquids allowed are:                    Water, Juice (No red color; non-citric and without pulp; diabetics please choose diet or no sugar options), Carbonated beverages (diabetics please choose diet or no sugar options), Clear Tea (No creamer, milk, or cream, including half & half and powdered creamer), Black Coffee Only (No creamer, milk or cream, including half & half and powdered creamer), and Clear Sports drink (No red color; diabetics please choose diet or no sugar options)    Take these medicines the morning of surgery with A SIP OF WATER                                       amlodipine, gabapentin.    Do not wear jewelry, make-up or nail polish, including gel polish,  artificial nails, or any other type of covering on natural nails (fingers and  toes).  Do not wear lotions, powders, or perfumes, or deodorant.  Do not shave 48 hours prior to surgery.  Men may shave face and neck.  Do not bring valuables to the hospital.  [facility] is not responsible for any belongings or valuables.  Contacts, dentures or bridgework may not be worn into surgery.  Leave your suitcase in the car.  After surgery it may be brought to your  room.  For patients admitted to the hospital, discharge time will be determined by your treatment team.  Patients discharged the day of surgery will not be allowed to drive home and must have someone with them for 24 hours.    Special instructions:   DO NOT smoke tobacco or vape for 24 hours before your procedure.  Please read over the following fact sheets that you were given. Anesthesia Post-op Instructions and Care and Recovery After Surgery      Upper Endoscopy, Adult, Care After After the procedure, it is common to have a sore throat. It is also common to have: Mild stomach pain or discomfort. Bloating. Nausea. Follow these instructions at home: The instructions below may help you care for yourself at home. Your health care provider may give you more instructions. If you have questions, ask your health care provider. If you were given a sedative during the procedure, it can affect you for several hours. Do not drive or operate machinery until your health care provider says that  it is safe. If you will be going home right after the procedure, plan to have a responsible adult: Take you home from the hospital or clinic. You will not be allowed to drive. Care for you for the time you are told. Follow instructions from your health care provider about what you may eat and drink. Return to your normal activities as told by your health care provider. Ask your health care provider what activities are safe for you. Take over-the-counter and prescription medicines only as told by your health care provider. Contact a health care provider if you: Have a sore throat that lasts longer than one day. Have trouble swallowing. Have a fever. Get help right away if you: Vomit blood or your vomit looks like coffee grounds. Have bloody, black, or tarry stools. Have a very bad sore throat or you cannot swallow. Have difficulty breathing or very bad pain in your chest or abdomen. These symptoms may  be an emergency. Get help right away. Call 911. Do not wait to see if the symptoms will go away. Do not drive yourself to the hospital. Summary After the procedure, it is common to have a sore throat, mild stomach discomfort, bloating, and nausea. If you were given a sedative during the procedure, it can affect you for several hours. Do not drive until your health care provider says that it is safe. Follow instructions from your health care provider about what you may eat and drink. Return to your normal activities as told by your health care provider. This information is not intended to replace advice given to you by your health care provider. Make sure you discuss any questions you have with your health care provider. Document Revised: 12/07/2021 Document Reviewed: 12/07/2021 Elsevier Patient Education  2024 [employer] Monitored Anesthesia Care, Care After The following information offers guidance on how to care for yourself after your procedure. Your health care provider may also give you more specific instructions. If you have problems or questions, contact your health care provider. What can I expect after the procedure? After the procedure, it is common to have: Tiredness. Little or no memory about what happened during or after the procedure. Impaired judgment when it comes to making decisions. Nausea or vomiting. Some trouble with balance. Follow these instructions at home: For the time period you were told by your health care provider:  Rest. Do not participate in activities where you could fall or become injured. Do not drive or use machinery. Do not drink alcohol. Do not take sleeping pills or medicines that cause drowsiness. Do not make important decisions or sign legal documents. Do not take care of children on your own. Medicines Take over-the-counter and prescription medicines only as told by your health care provider. If you were prescribed antibiotics, take them as  told by your health care provider. Do not stop using the antibiotic even if you start to feel better. Eating and drinking Follow instructions from your health care provider about what you may eat and drink. Drink enough fluid to keep your urine pale yellow. If you vomit: Drink clear fluids slowly and in small amounts as you are able. Clear fluids include water, ice chips, low-calorie sports drinks, and fruit juice that has water added to it (diluted fruit juice). Eat light and bland foods in small amounts as you are able. These foods include bananas, applesauce, rice, lean meats, toast, and crackers. General instructions  Have a responsible adult stay with you for the time you are  told. It is important to have someone help care for you until you are awake and alert. If you have sleep apnea, surgery and some medicines can increase your risk for breathing problems. Follow instructions from your health care provider about wearing your sleep device: When you are sleeping. This includes during daytime naps. While taking prescription pain medicines, sleeping medicines, or medicines that make you drowsy. Do not use any products that contain nicotine or tobacco. These products include cigarettes, chewing tobacco, and vaping devices, such as e-cigarettes. If you need help quitting, ask your health care provider. Contact a health care provider if: You feel nauseous or vomit every time you eat or drink. You feel light-headed. You are still sleepy or having trouble with balance after 24 hours. You get a rash. You have a fever. You have redness or swelling around the IV site. Get help right away if: You have trouble breathing. You have new confusion after you get home. These symptoms may be an emergency. Get help right away. Call 911. Do not wait to see if the symptoms will go away. Do not drive yourself to the hospital. This information is not intended to replace advice given to you by your health  care provider. Make sure you discuss any questions you have with your health care provider. Document Revised: 01/23/2022 Document Reviewed: 01/23/2022 Elsevier Patient Education  2024 [employer].

## 2023-08-16 ENCOUNTER — Encounter (HOSPITAL_COMMUNITY): Payer: Self-pay

## 2023-08-16 ENCOUNTER — Encounter (HOSPITAL_COMMUNITY)
Admission: RE | Admit: 2023-08-16 | Discharge: 2023-08-16 | Disposition: A | Discharge status: Home / Self Care | Payer: 59 | Source: Ambulatory Visit | Attending: Gastroenterology | Admitting: Gastroenterology

## 2023-08-16 ENCOUNTER — Telehealth: Payer: Self-pay | Admitting: *Deleted

## 2023-08-16 NOTE — Pre-Procedure Instructions (Signed)
Office messaged because patient did not show for her pre-op.

## 2023-08-16 NOTE — Telephone Encounter (Signed)
Per endo "pt. no showed for PAT today"  Called pt and received VM. LMTCB and advised she missed pre-op today.

## 2023-08-20 ENCOUNTER — Encounter (HOSPITAL_COMMUNITY): Admission: RE | Payer: Self-pay | Source: Home / Self Care

## 2023-08-20 ENCOUNTER — Ambulatory Visit (HOSPITAL_COMMUNITY): Admission: RE | Admit: 2023-08-20 | Payer: 59 | Source: Home / Self Care | Admitting: Gastroenterology

## 2023-08-20 ENCOUNTER — Telehealth (INDEPENDENT_AMBULATORY_CARE_PROVIDER_SITE_OTHER): Payer: Self-pay

## 2023-08-20 DIAGNOSIS — E1165 Type 2 diabetes mellitus with hyperglycemia: Secondary | ICD-10-CM

## 2023-08-20 SURGERY — ESOPHAGOGASTRODUODENOSCOPY (EGD) WITH PROPOFOL
Anesthesia: Monitor Anesthesia Care

## 2023-08-20 NOTE — Telephone Encounter (Signed)
Patient says she tried to call back regarding her missing pre op,but could not get anyone. Patient had called to see what time she needed to be at the hospital today, I advised it was cancelled due to her missing the pre op. She is aware someone will call her to reschedule the EGD. Thanks  Armstead Peaks, CMA      08/16/23  2:01 PM Note Per endo "pt. no showed for PAT today"   Called pt and received VM. LMTCB and advised she missed pre-op today.

## 2023-08-21 NOTE — Telephone Encounter (Signed)
Patient says she tried to call back regarding her missing pre op,but could not get anyone. Patient had called to see what time she needed to be at the hospital today, I advised it was cancelled due to her missing the pre op. She is aware someone will call her to reschedule the EGD. Thanks   Armstead Peaks, CMA    Will call pt to reschedule

## 2023-08-21 NOTE — Telephone Encounter (Signed)
Pt contacted. Pt scheduled for 09/06/23. Updated instructions sent via mail. Will call with pre op appt.

## 2023-08-30 NOTE — Patient Instructions (Signed)
Rebekah Gutierrez  08/30/2023     @PREFPERIOPPHARMACY @   Your procedure is scheduled on  09/06/2023.   Report to [facility] at   0930  A.M.   Call this number if you have problems the morning of surgery:  [phone]  If you experience any cold or flu symptoms such as cough, fever, chills, shortness of breath, etc. between now and your scheduled surgery, please notify us at the above number.   Remember:        You should have decreased your aspirin to 81 mg on 08/31/2023.       Your last dose of farxiga should be on 09/02/2023.       DO NOT take any medications for diabetes the morning of your procedure.      Use your inhaler before you come to the hospital and bring your rescue inhaler with you.  Follow the diet instructions given to you by the office.   You may drink clear liquids until 0730 am on 09/06/2023.     Clear liquids allowed are:                    Water, Juice (No red color; non-citric and without pulp; diabetics please choose diet or no sugar options), Carbonated beverages (diabetics please choose diet or no sugar options), Clear Tea (No creamer, milk, or cream, including half & half and powdered creamer), Black Coffee Only (No creamer, milk or cream, including half & half and powdered creamer), and Clear Sports drink (No red color; diabetics please choose diet or no sugar options)     Take these medicines the morning of surgery with A SIP OF WATER                               amlodipine, gabapentin.     Do not wear jewelry, make-up or nail polish, including gel polish,  artificial nails, or any other type of covering on natural nails (fingers and  toes).  Do not wear lotions, powders, or perfumes, or deodorant.  Do not shave 48 hours prior to surgery.  Men may shave face and neck.  Do not bring valuables to the hospital.  [facility] is not responsible for any belongings or valuables.  Contacts, dentures or bridgework may not be worn into surgery.   Leave your suitcase in the car.  After surgery it may be brought to your room.  For patients admitted to the hospital, discharge time will be determined by your treatment team.  Patients discharged the day of surgery will not be allowed to drive home and must have someone with them for 24 hours.    Special instructions:   DO NOT smoke tobacco or vape for 24 hours before your procedure.  Please read over the following fact sheets that you were given. Anesthesia Post-op Instructions and Care and Recovery After Surgery      Upper Endoscopy, Adult, Care After After the procedure, it is common to have a sore throat. It is also common to have: Mild stomach pain or discomfort. Bloating. Nausea. Follow these instructions at home: The instructions below may help you care for yourself at home. Your health care provider may give you more instructions. If you have questions, ask your health care provider. If you were given a sedative during the procedure, it can affect you for several hours. Do not drive or operate  machinery until your health care provider says that it is safe. If you will be going home right after the procedure, plan to have a responsible adult: Take you home from the hospital or clinic. You will not be allowed to drive. Care for you for the time you are told. Follow instructions from your health care provider about what you may eat and drink. Return to your normal activities as told by your health care provider. Ask your health care provider what activities are safe for you. Take over-the-counter and prescription medicines only as told by your health care provider. Contact a health care provider if you: Have a sore throat that lasts longer than one day. Have trouble swallowing. Have a fever. Get help right away if you: Vomit blood or your vomit looks like coffee grounds. Have bloody, black, or tarry stools. Have a very bad sore throat or you cannot swallow. Have difficulty  breathing or very bad pain in your chest or abdomen. These symptoms may be an emergency. Get help right away. Call 911. Do not wait to see if the symptoms will go away. Do not drive yourself to the hospital. Summary After the procedure, it is common to have a sore throat, mild stomach discomfort, bloating, and nausea. If you were given a sedative during the procedure, it can affect you for several hours. Do not drive until your health care provider says that it is safe. Follow instructions from your health care provider about what you may eat and drink. Return to your normal activities as told by your health care provider. This information is not intended to replace advice given to you by your health care provider. Make sure you discuss any questions you have with your health care provider. Document Revised: 12/07/2021 Document Reviewed: 12/07/2021 Elsevier Patient Education  2024 [employer] Monitored Anesthesia Care, Care After The following information offers guidance on how to care for yourself after your procedure. Your health care provider may also give you more specific instructions. If you have problems or questions, contact your health care provider. What can I expect after the procedure? After the procedure, it is common to have: Tiredness. Little or no memory about what happened during or after the procedure. Impaired judgment when it comes to making decisions. Nausea or vomiting. Some trouble with balance. Follow these instructions at home: For the time period you were told by your health care provider:  Rest. Do not participate in activities where you could fall or become injured. Do not drive or use machinery. Do not drink alcohol. Do not take sleeping pills or medicines that cause drowsiness. Do not make important decisions or sign legal documents. Do not take care of children on your own. Medicines Take over-the-counter and prescription medicines only as told by  your health care provider. If you were prescribed antibiotics, take them as told by your health care provider. Do not stop using the antibiotic even if you start to feel better. Eating and drinking Follow instructions from your health care provider about what you may eat and drink. Drink enough fluid to keep your urine pale yellow. If you vomit: Drink clear fluids slowly and in small amounts as you are able. Clear fluids include water, ice chips, low-calorie sports drinks, and fruit juice that has water added to it (diluted fruit juice). Eat light and bland foods in small amounts as you are able. These foods include bananas, applesauce, rice, lean meats, toast, and crackers. General instructions  Have a responsible adult  stay with you for the time you are told. It is important to have someone help care for you until you are awake and alert. If you have sleep apnea, surgery and some medicines can increase your risk for breathing problems. Follow instructions from your health care provider about wearing your sleep device: When you are sleeping. This includes during daytime naps. While taking prescription pain medicines, sleeping medicines, or medicines that make you drowsy. Do not use any products that contain nicotine or tobacco. These products include cigarettes, chewing tobacco, and vaping devices, such as e-cigarettes. If you need help quitting, ask your health care provider. Contact a health care provider if: You feel nauseous or vomit every time you eat or drink. You feel light-headed. You are still sleepy or having trouble with balance after 24 hours. You get a rash. You have a fever. You have redness or swelling around the IV site. Get help right away if: You have trouble breathing. You have new confusion after you get home. These symptoms may be an emergency. Get help right away. Call 911. Do not wait to see if the symptoms will go away. Do not drive yourself to the hospital. This  information is not intended to replace advice given to you by your health care provider. Make sure you discuss any questions you have with your health care provider. Document Revised: 01/23/2022 Document Reviewed: 01/23/2022 Elsevier Patient Education  2024 [employer].

## 2023-09-03 ENCOUNTER — Encounter (HOSPITAL_COMMUNITY): Payer: Self-pay

## 2023-09-03 ENCOUNTER — Encounter (HOSPITAL_COMMUNITY)
Admission: RE | Admit: 2023-09-03 | Discharge: 2023-09-03 | Disposition: A | Discharge status: Home / Self Care | Payer: 59 | Source: Ambulatory Visit | Attending: Gastroenterology | Admitting: Gastroenterology

## 2023-09-03 VITALS — BP 118/66 | HR 79 | Temp 97.8°F | Resp 18 | Ht 60.0 in | Wt 147.7 lb

## 2023-09-03 DIAGNOSIS — I10 Essential (primary) hypertension: Secondary | ICD-10-CM

## 2023-09-03 DIAGNOSIS — Z01818 Encounter for other preprocedural examination: Secondary | ICD-10-CM | POA: Diagnosis present

## 2023-09-03 DIAGNOSIS — Z01810 Encounter for preprocedural cardiovascular examination: Secondary | ICD-10-CM | POA: Insufficient documentation

## 2023-09-03 DIAGNOSIS — I452 Bifascicular block: Secondary | ICD-10-CM | POA: Diagnosis not present

## 2023-09-03 DIAGNOSIS — R9431 Abnormal electrocardiogram [ECG] [EKG]: Secondary | ICD-10-CM | POA: Diagnosis not present

## 2023-09-06 ENCOUNTER — Encounter (HOSPITAL_COMMUNITY): Payer: Self-pay | Admitting: Gastroenterology

## 2023-09-06 ENCOUNTER — Ambulatory Visit (HOSPITAL_COMMUNITY): Payer: 59 | Admitting: Certified Registered"

## 2023-09-06 ENCOUNTER — Encounter (HOSPITAL_COMMUNITY)
Admission: RE | Disposition: A | Discharge status: Home / Self Care | Payer: Self-pay | Source: Home / Self Care | Attending: Gastroenterology

## 2023-09-06 ENCOUNTER — Other Ambulatory Visit: Payer: Self-pay

## 2023-09-06 ENCOUNTER — Ambulatory Visit (HOSPITAL_COMMUNITY)
Admission: RE | Admit: 2023-09-06 | Discharge: 2023-09-06 | Disposition: A | Discharge status: Home / Self Care | Payer: 59 | Attending: Gastroenterology | Admitting: Gastroenterology

## 2023-09-06 DIAGNOSIS — I10 Essential (primary) hypertension: Secondary | ICD-10-CM | POA: Insufficient documentation

## 2023-09-06 DIAGNOSIS — Z7984 Long term (current) use of oral hypoglycemic drugs: Secondary | ICD-10-CM | POA: Insufficient documentation

## 2023-09-06 DIAGNOSIS — K921 Melena: Secondary | ICD-10-CM | POA: Insufficient documentation

## 2023-09-06 DIAGNOSIS — B3781 Candidal esophagitis: Secondary | ICD-10-CM | POA: Diagnosis not present

## 2023-09-06 DIAGNOSIS — K449 Diaphragmatic hernia without obstruction or gangrene: Secondary | ICD-10-CM

## 2023-09-06 DIAGNOSIS — F17210 Nicotine dependence, cigarettes, uncomplicated: Secondary | ICD-10-CM | POA: Diagnosis not present

## 2023-09-06 DIAGNOSIS — E119 Type 2 diabetes mellitus without complications: Secondary | ICD-10-CM

## 2023-09-06 DIAGNOSIS — K2289 Other specified disease of esophagus: Secondary | ICD-10-CM | POA: Diagnosis not present

## 2023-09-06 DIAGNOSIS — R6881 Early satiety: Secondary | ICD-10-CM | POA: Insufficient documentation

## 2023-09-06 DIAGNOSIS — K2100 Gastro-esophageal reflux disease with esophagitis, without bleeding: Secondary | ICD-10-CM | POA: Diagnosis not present

## 2023-09-06 HISTORY — PX: ESOPHAGOGASTRODUODENOSCOPY (EGD) WITH PROPOFOL: SHX5813

## 2023-09-06 HISTORY — PX: BIOPSY: SHX5522

## 2023-09-06 LAB — CBC WITH DIFFERENTIAL/PLATELET
Abs Immature Granulocytes: 0.01 10*3/uL (ref 0.00–0.07)
Basophils Absolute: 0 10*3/uL (ref 0.0–0.1)
Basophils Relative: 0 %
Eosinophils Absolute: 0 10*3/uL (ref 0.0–0.5)
Eosinophils Relative: 1 %
HCT: 37 % (ref 36.0–46.0)
Hemoglobin: 11.5 g/dL — ABNORMAL LOW (ref 12.0–15.0)
Immature Granulocytes: 0 %
Lymphocytes Relative: 51 %
Lymphs Abs: 2.5 10*3/uL (ref 0.7–4.0)
MCH: 26.9 pg (ref 26.0–34.0)
MCHC: 31.1 g/dL (ref 30.0–36.0)
MCV: 86.7 fL (ref 80.0–100.0)
Monocytes Absolute: 0.6 10*3/uL (ref 0.1–1.0)
Monocytes Relative: 12 %
Neutro Abs: 1.8 10*3/uL (ref 1.7–7.7)
Neutrophils Relative %: 36 %
Platelets: 215 10*3/uL (ref 150–400)
RBC: 4.27 MIL/uL (ref 3.87–5.11)
RDW: 13.7 % (ref 11.5–15.5)
WBC: 4.9 10*3/uL (ref 4.0–10.5)
nRBC: 0 % (ref 0.0–0.2)

## 2023-09-06 LAB — GLUCOSE, CAPILLARY: Glucose-Capillary: 126 mg/dL — ABNORMAL HIGH (ref 70–99)

## 2023-09-06 SURGERY — ESOPHAGOGASTRODUODENOSCOPY (EGD) WITH PROPOFOL
Anesthesia: General

## 2023-09-06 MED ORDER — LIDOCAINE HCL (CARDIAC) PF 100 MG/5ML IV SOSY
PREFILLED_SYRINGE | INTRAVENOUS | Status: DC | PRN
  Administered 2023-09-06: 80 mg via INTRATRACHEAL

## 2023-09-06 MED ORDER — LACTATED RINGERS IV SOLN: INTRAVENOUS | Status: DC | PRN

## 2023-09-06 MED ORDER — PROPOFOL 10 MG/ML IV BOLUS
INTRAVENOUS | Status: DC | PRN
  Administered 2023-09-06: 50 mg via INTRAVENOUS
  Administered 2023-09-06: 125 ug/kg/min via INTRAVENOUS

## 2023-09-06 NOTE — H&P (Signed)
Ancil Boozer Hesch is an 77 y.o. female.   Chief Complaint: Melena HPI: 77 year old female with past medical history of diabetes, GERD, hypertension, coming for evaluation of melena.  Patient reports that she has presented intermittent episodes of melena.  Last episode was a week ago.  Denies any fever chills, abdominal pain, nausea, vomiting.  Not taking any NSAIDs.  Past Medical History:  Diagnosis Date   Diabetes (HCC)    Encounter for screening colonoscopy 06/05/2012   GERD (gastroesophageal reflux disease)    Hiatal hernia    Hypertension    Vertigo     Past Surgical History:  Procedure Laterality Date   BACK SURGERY     L4-5 PLIF   BLADDER SURGERY     CERVICAL SPINE SURGERY     fusion   CHOLECYSTECTOMY, LAPAROSCOPIC     Remote Past   COLONOSCOPY  06/20/2012   Procedure: COLONOSCOPY;  Surgeon: Malissa Hippo, MD;  Location: [facility];  Service: Endoscopy;  Laterality: N/A;  250   COLONOSCOPY WITH PROPOFOL N/A 07/26/2021   Procedure: COLONOSCOPY WITH PROPOFOL;  Surgeon: Dolores Frame, MD;  Location: [facility];  Service: Gastroenterology;  Laterality: N/A;  9:25   FOOT SURGERY     left    HAND SURGERY     left   INCISIONAL HERNIA REPAIR N/A 05/26/2020   Procedure: Mena Pauls WITH MESH;  Surgeon: Franky Macho, MD;  Location: [facility];  Service: General;  Laterality: N/A;   POLYPECTOMY  07/26/2021   Procedure: POLYPECTOMY;  Surgeon: Dolores Frame, MD;  Location: [facility];  Service: Gastroenterology;;   TUBAL LIGATION      Family History  Problem Relation Age of Onset   Hypertension Mother    Stroke Mother    Hypertension Father    Cancer Father    Colon cancer Neg Hx    Social History:  reports that she has been smoking cigarettes. She has a 22.5 pack-year smoking history. She has been exposed to tobacco smoke. She has never used smokeless tobacco. She reports that she does not drink alcohol and does not use  drugs.  Allergies: No Known Allergies  Medications Prior to Admission  Medication Sig Dispense Refill   acetaminophen (TYLENOL) 500 MG tablet Take 1,000 mg by mouth every 6 (six) hours as needed for moderate pain.     ALPHAGAN P 0.1 % SOLN Place 1 drop into both eyes 2 (two) times daily.     amLODipine (NORVASC) 10 MG tablet Take 1 tablet (10 mg total) by mouth daily. 90 tablet 3   Apoaequorin (PREVAGEN PO) Take 1 tablet by mouth daily.     aspirin 325 MG EC tablet Take 325 mg by mouth daily.     aspirin 81 MG chewable tablet Chew 81 mg by mouth daily.     atorvastatin (LIPITOR) 20 MG tablet Take 0.5 tablets (10 mg total) by mouth daily. 90 tablet 3   Calcium Carb-Cholecalciferol (CALCIUM 1000 + D PO) Take 1 tablet by mouth daily.     Cinnamon 500 MG TABS Take 500 mg by mouth at bedtime.      dicyclomine (BENTYL) 10 MG capsule TAKE 1 CAPSULE BY MOUTH TWICE DAILY AS NEEDED FOR SPASM 60 capsule 0   dorzolamide-timolol (COSOPT) 22.3-6.8 MG/ML ophthalmic solution Place 1 drop into both eyes 2 (two) times daily.     gabapentin (NEURONTIN) 300 MG capsule TAKE 1 CAPSULE BY MOUTH ONCE DAILY AT BEDTIME FOR BACK PAIN 90 capsule  3   glipiZIDE (GLUCOTROL XL) 10 MG 24 hr tablet Take 1 tablet (10 mg total) by mouth daily with breakfast. 90 tablet 3   glucose blood (ONETOUCH ULTRA) test strip USE 1 STRIP TO CHECK GLUCOSE TWICE DAILY AS DIRECTED 100 each 3   hydrochlorothiazide (HYDRODIURIL) 12.5 MG tablet Take 12.5 mg by mouth daily.     hydrocortisone (ANUSOL-HC) 2.5 % rectal cream Place 1 Application rectally 3 (three) times daily. 56 g 1   Lancets (ONETOUCH DELICA PLUS LANCET33G) MISC USE 1 LANCET TO CHECK GLUCOSE TWICE DAILY AS DIRECTED 100 each 3   lisinopril (ZESTRIL) 40 MG tablet Take 1 tablet (40 mg total) by mouth daily. 90 tablet 3   Methylcellulose, Laxative, (FIBER THERAPY) 500 MG TABS Take 500 mg by mouth daily.     Multiple Vitamin (MULTIVITAMIN WITH MINERALS) TABS tablet Take 1 tablet by  mouth daily.     Potassium 99 MG TABS Take 99 mg by mouth daily.     VYZULTA 0.024 % SOLN Place 1 drop into both eyes at bedtime.      albuterol (VENTOLIN HFA) 108 (90 Base) MCG/ACT inhaler Inhale 2 puffs into the lungs every 6 (six) hours as needed for wheezing or shortness of breath. 8 g 0   FARXIGA 10 MG TABS tablet Take 10 mg by mouth daily.     mycophenolate (CELLCEPT) 500 MG tablet Take 1,000 mg by mouth 2 (two) times daily. (Patient not taking: Reported on 07/24/2023)     Omega-3 Fatty Acids (FISH OIL) 1000 MG CAPS tAKE 1000MG  TWICE A DAY FOR CHOLESTEROL (Patient taking differently: Take 1,000 mg by mouth in the morning and at bedtime.)  0    Results for orders placed or performed during the hospital encounter of 09/06/23 (from the past 48 hours)  Glucose, capillary     Status: Abnormal   Collection Time: 09/06/23  9:53 AM  Result Value Ref Range   Glucose-Capillary 126 (H) 70 - 99 mg/dL    Comment: Glucose reference range applies only to samples taken after fasting for at least 8 hours.   No results found.  Review of Systems  All other systems reviewed and are negative.   Pulse 65, temperature 98.7 F (37.1 C), temperature source Oral, resp. rate 18, height 5' (1.524 m), weight 67 kg, SpO2 100%. Physical Exam  GENERAL: The patient is AO x3, in no acute distress. HEENT: Head is normocephalic and atraumatic. EOMI are intact. Mouth is well hydrated and without lesions. NECK: Supple. No masses LUNGS: Clear to auscultation. No presence of rhonchi/wheezing/rales. Adequate chest expansion HEART: RRR, normal s1 and s2. ABDOMEN: Soft, nontender, no guarding, no peritoneal signs, and nondistended. BS +. No masses. EXTREMITIES: Without any cyanosis, clubbing, rash, lesions or edema. NEUROLOGIC: AOx3, no focal motor deficit. SKIN: no jaundice, no rashes  Assessment/Plan 77 year old female with past medical history of diabetes, GERD, hypertension, coming for evaluation of melena.  Will  proceed with EGD.  Dolores Frame, MD 09/06/2023, 10:53 AM

## 2023-09-06 NOTE — Discharge Instructions (Signed)
You are being discharged to home.  Resume your previous diet.  We are waiting for your pathology results.  

## 2023-09-06 NOTE — Anesthesia Preprocedure Evaluation (Signed)
Anesthesia Evaluation  Patient identified by MRN, date of birth, ID band Patient awake    Reviewed: Allergy & Precautions, H&P , NPO status , Patient's Chart, lab work & pertinent test results, reviewed documented beta blocker date and time   Airway Mallampati: II  TM Distance: >3 FB Neck ROM: full    Dental no notable dental hx. (+) Dental Advisory Given, Teeth Intact   Pulmonary neg pulmonary ROS, Current Smoker and Patient abstained from smoking.   Pulmonary exam normal breath sounds clear to auscultation       Cardiovascular Exercise Tolerance: Good hypertension, negative cardio ROS Normal cardiovascular exam Rhythm:regular Rate:Normal     Neuro/Psych negative neurological ROS  negative psych ROS   GI/Hepatic Neg liver ROS, hiatal hernia,GERD  Medicated,,  Endo/Other  negative endocrine ROSdiabetes    Renal/GU negative Renal ROS  negative genitourinary   Musculoskeletal   Abdominal   Peds  Hematology negative hematology ROS (+)   Anesthesia Other Findings   Reproductive/Obstetrics negative OB ROS                             Anesthesia Physical Anesthesia Plan  ASA: 3  Anesthesia Plan: General   Post-op Pain Management: Minimal or no pain anticipated   Induction:   PONV Risk Score and Plan: Propofol infusion  Airway Management Planned: Nasal Cannula and Natural Airway  Additional Equipment:   Intra-op Plan:   Post-operative Plan:   Informed Consent: I have reviewed the patients History and Physical, chart, labs and discussed the procedure including the risks, benefits and alternatives for the proposed anesthesia with the patient or authorized representative who has indicated his/her understanding and acceptance.     Dental Advisory Given  Plan Discussed with: CRNA  Anesthesia Plan Comments:         Anesthesia Quick Evaluation

## 2023-09-06 NOTE — Anesthesia Postprocedure Evaluation (Signed)
Anesthesia Post Note  Patient: Rebekah Gutierrez  Procedure(s) Performed: ESOPHAGOGASTRODUODENOSCOPY (EGD) WITH PROPOFOL BIOPSY  Patient location during evaluation: PACU Anesthesia Type: General Level of consciousness: awake and alert Pain management: pain level controlled Vital Signs Assessment: post-procedure vital signs reviewed and stable Respiratory status: spontaneous breathing, nonlabored ventilation, respiratory function stable and patient connected to nasal cannula oxygen Cardiovascular status: blood pressure returned to baseline and stable Postop Assessment: no apparent nausea or vomiting Anesthetic complications: no   There were no known notable events for this encounter.   Last Vitals:  Vitals:   09/06/23 1115 09/06/23 1119  BP: (!) 91/56 122/74  Pulse:  69  Resp:  18  Temp:    SpO2:  98%    Last Pain:  Vitals:   09/06/23 1119  TempSrc:   PainSc: 0-No pain                 Rebekah Gutierrez

## 2023-09-06 NOTE — Op Note (Signed)
[facility] Patient Name: Rebekah Gutierrez Procedure Date: 09/06/2023 10:48 AM MRN: [id number] Date of Birth: [birth year] Attending MD: Katrinka Blazing , , [id number] CSN: [id number] Age: 77 Admit Type: Outpatient Procedure:                Upper GI endoscopy Indications:              Melena Providers:                Katrinka Blazing, Crystal Page, Lennice Sites                            Technician, Technician, Zena Amos Referring MD:              Medicines:                Monitored Anesthesia Care Complications:            No immediate complications. Estimated Blood Loss:     Estimated blood loss: none. Procedure:                Pre-Anesthesia Assessment:                           - Prior to the procedure, a History and Physical                            was performed, and patient medications, allergies                            and sensitivities were reviewed. The patient's                            tolerance of previous anesthesia was reviewed.                           - The risks and benefits of the procedure and the                            sedation options and risks were discussed with the                            patient. All questions were answered and informed                            consent was obtained.                           - ASA Grade Assessment: III - A patient with severe                            systemic disease.                           After obtaining informed consent, the endoscope was                            passed under direct vision. Throughout the  procedure, the patient's blood pressure, pulse, and                            oxygen saturations were monitored continuously. The                            [id number]-[id number] ([id number]) scope was introduced through the                            mouth, and advanced to the second part of duodenum.                            The upper GI endoscopy was accomplished without                             difficulty. The patient tolerated the procedure                            well. Scope In: 10:59:18 AM Scope Out: 11:04:03 AM Total Procedure Duration: 0 hours 4 minutes 45 seconds  Findings:      White nummular lesions were noted in the middle third of the esophagus.       Biopsies were taken with a cold forceps for histology.      A 2 cm hiatal hernia was present.      The gastroesophageal flap valve was visualized endoscopically and       classified as Hill Grade III (minimal fold, loose to endoscope, hiatal       hernia likely).      The stomach was normal.      The examined duodenum was normal. Impression:               - White nummular lesions in esophageal mucosa.                            Biopsied.                           - 2 cm hiatal hernia.                           - Normal stomach.                           - Normal examined duodenum. Moderate Sedation:      Per Anesthesia Care Recommendation:           - Discharge patient to home (ambulatory).                           - Resume previous diet.                           - Await pathology results.                           -Check CBC. Procedure Code(s):        --- Professional ---  09811, Esophagogastroduodenoscopy, flexible,                            transoral; with biopsy, single or multiple Diagnosis Code(s):        --- Professional ---                           K22.89, Other specified disease of esophagus                           K44.9, Diaphragmatic hernia without obstruction or                            gangrene                           K92.1, Melena (includes Hematochezia) CPT copyright 2022 American Medical Association. All rights reserved. The codes documented in this report are preliminary and upon coder review may  be revised to meet current compliance requirements. Katrinka Blazing, MD Katrinka Blazing,  09/06/2023 11:07:21 AM This report has been  signed electronically. Number of Addenda: 0

## 2023-09-06 NOTE — Transfer of Care (Signed)
Immediate Anesthesia Transfer of Care Note  Patient: Lynix Glendening. [name]  Procedure(s) Performed: ESOPHAGOGASTRODUODENOSCOPY (EGD) WITH PROPOFOL BIOPSY  Patient Location: PACU  Anesthesia Type:General  Level of Consciousness: awake, alert , and oriented  Airway & Oxygen Therapy: Patient Spontanous Breathing  Post-op Assessment: Report given to RN and Post -op Vital signs reviewed and stable  Post vital signs: Reviewed and stable  Last Vitals:  Vitals Value Taken Time  BP 85/42 09/06/23 1107  Temp 36.7 C 09/06/23 1107  Pulse 75 09/06/23 1107  Resp 20 09/06/23 1107  SpO2 100 % 09/06/23 1107    Last Pain:  Vitals:   09/06/23 1107  TempSrc: Axillary  PainSc: 0-No pain      Patients Stated Pain Goal: 7 (09/06/23 0948)  Complications: No notable events documented.

## 2023-09-07 ENCOUNTER — Other Ambulatory Visit (INDEPENDENT_AMBULATORY_CARE_PROVIDER_SITE_OTHER): Payer: Self-pay | Admitting: Gastroenterology

## 2023-09-07 DIAGNOSIS — B3781 Candidal esophagitis: Secondary | ICD-10-CM

## 2023-09-07 LAB — SURGICAL PATHOLOGY

## 2023-09-07 MED ORDER — FLUCONAZOLE 100 MG PO TABS: 100.0000 mg | ORAL_TABLET | Freq: Every day | ORAL | 0 refills | Status: DC

## 2023-09-08 ENCOUNTER — Other Ambulatory Visit (INDEPENDENT_AMBULATORY_CARE_PROVIDER_SITE_OTHER): Payer: Self-pay | Admitting: Gastroenterology

## 2023-09-10 NOTE — Telephone Encounter (Signed)
 Last seen 07/24/23

## 2023-09-13 DIAGNOSIS — I10 Essential (primary) hypertension: Secondary | ICD-10-CM | POA: Diagnosis not present

## 2023-09-13 DIAGNOSIS — E1165 Type 2 diabetes mellitus with hyperglycemia: Secondary | ICD-10-CM | POA: Diagnosis not present

## 2023-09-14 DIAGNOSIS — I739 Peripheral vascular disease, unspecified: Secondary | ICD-10-CM | POA: Diagnosis not present

## 2023-09-14 DIAGNOSIS — M79671 Pain in right foot: Secondary | ICD-10-CM | POA: Diagnosis not present

## 2023-09-14 DIAGNOSIS — M79672 Pain in left foot: Secondary | ICD-10-CM | POA: Diagnosis not present

## 2023-09-14 DIAGNOSIS — E1140 Type 2 diabetes mellitus with diabetic neuropathy, unspecified: Secondary | ICD-10-CM | POA: Diagnosis not present

## 2023-09-14 DIAGNOSIS — M79675 Pain in left toe(s): Secondary | ICD-10-CM | POA: Diagnosis not present

## 2023-09-14 DIAGNOSIS — M79674 Pain in right toe(s): Secondary | ICD-10-CM | POA: Diagnosis not present

## 2023-09-14 DIAGNOSIS — L110 Acquired keratosis follicularis: Secondary | ICD-10-CM | POA: Diagnosis not present

## 2023-09-18 DIAGNOSIS — I700 Atherosclerosis of aorta: Secondary | ICD-10-CM | POA: Diagnosis not present

## 2023-09-18 DIAGNOSIS — E876 Hypokalemia: Secondary | ICD-10-CM | POA: Diagnosis not present

## 2023-09-18 DIAGNOSIS — R809 Proteinuria, unspecified: Secondary | ICD-10-CM | POA: Diagnosis not present

## 2023-09-18 DIAGNOSIS — Z7984 Long term (current) use of oral hypoglycemic drugs: Secondary | ICD-10-CM | POA: Diagnosis not present

## 2023-09-18 DIAGNOSIS — F17210 Nicotine dependence, cigarettes, uncomplicated: Secondary | ICD-10-CM | POA: Diagnosis not present

## 2023-09-18 DIAGNOSIS — E1165 Type 2 diabetes mellitus with hyperglycemia: Secondary | ICD-10-CM | POA: Diagnosis not present

## 2023-09-18 DIAGNOSIS — I10 Essential (primary) hypertension: Secondary | ICD-10-CM | POA: Diagnosis not present

## 2023-09-18 DIAGNOSIS — G4700 Insomnia, unspecified: Secondary | ICD-10-CM | POA: Diagnosis not present

## 2023-09-18 DIAGNOSIS — N183 Chronic kidney disease, stage 3 unspecified: Secondary | ICD-10-CM | POA: Diagnosis not present

## 2023-09-18 DIAGNOSIS — E785 Hyperlipidemia, unspecified: Secondary | ICD-10-CM | POA: Diagnosis not present

## 2023-09-18 DIAGNOSIS — E1142 Type 2 diabetes mellitus with diabetic polyneuropathy: Secondary | ICD-10-CM | POA: Diagnosis not present

## 2023-09-18 DIAGNOSIS — Z0001 Encounter for general adult medical examination with abnormal findings: Secondary | ICD-10-CM | POA: Diagnosis not present

## 2023-09-19 ENCOUNTER — Ambulatory Visit: Payer: 59 | Attending: Gastroenterology | Admitting: Physical Therapy

## 2023-09-19 DIAGNOSIS — R279 Unspecified lack of coordination: Secondary | ICD-10-CM | POA: Diagnosis not present

## 2023-09-19 DIAGNOSIS — R293 Abnormal posture: Secondary | ICD-10-CM | POA: Insufficient documentation

## 2023-09-19 DIAGNOSIS — M6281 Muscle weakness (generalized): Secondary | ICD-10-CM | POA: Diagnosis not present

## 2023-09-19 NOTE — Therapy (Signed)
 OUTPATIENT PHYSICAL THERAPY FEMALE PELVIC TREATMENT   Patient Name: Rebekah Gutierrez. Calloway MRN: [id number] DOB:Oct 15, 1945, 78 y.o., female Today's Date: 09/19/2023     END OF SESSION:  PT End of Session - 09/19/23 1449     Visit Number 6    Date for PT Re-Evaluation 11/09/23    Authorization Type [employer] MCR    Progress Note Due on Visit 13    PT Start Time 1448    PT Stop Time 1526    PT Time Calculation (min) 38 min    Activity Tolerance Patient tolerated treatment well    Behavior During Therapy WFL for tasks assessed/performed             Past Medical History:  Diagnosis Date   Diabetes (HCC)    Encounter for screening colonoscopy 06/05/2012   GERD (gastroesophageal reflux disease)    Hiatal hernia    Hypertension    Vertigo    Past Surgical History:  Procedure Laterality Date   BACK SURGERY     L4-5 PLIF   BLADDER SURGERY     CERVICAL SPINE SURGERY     fusion   CHOLECYSTECTOMY, LAPAROSCOPIC     Remote Past   COLONOSCOPY  06/20/2012   Procedure: COLONOSCOPY;  Surgeon: Claudis RAYMOND Rivet, MD;  Location: [facility];  Service: Endoscopy;  Laterality: N/A;  250   COLONOSCOPY WITH PROPOFOL  N/A 07/26/2021   Procedure: COLONOSCOPY WITH PROPOFOL ;  Surgeon: Eartha Angelia Sieving, MD;  Location: [facility];  Service: Gastroenterology;  Laterality: N/A;  9:25   FOOT SURGERY     left    HAND SURGERY     left   INCISIONAL HERNIA REPAIR N/A 05/26/2020   Procedure: SHIRLENE PETRI WITH MESH;  Surgeon: Mavis Anes, MD;  Location: [facility];  Service: General;  Laterality: N/A;   POLYPECTOMY  07/26/2021   Procedure: POLYPECTOMY;  Surgeon: Eartha Angelia Sieving, MD;  Location: [facility];  Service: Gastroenterology;;   TUBAL LIGATION     Patient Active Problem List   Diagnosis Date Noted   Hemorrhoids 07/24/2023   Melena 07/24/2023   Early satiety 07/24/2023   Grade I hemorrhoids 05/08/2023   Diarrhea 01/16/2023   Pelvic floor dysfunction in female 01/16/2023    Anal sphincter spasm 04/18/2022   Loose stools 04/18/2022   Incisional hernia, without obstruction or gangrene    Aortic atherosclerosis (HCC) 01/05/2020   Ventral hernia 06/09/2019   Hypertriglyceridemia 04/21/2019   Multiple lung nodules on CT 04/30/2017   Spinal stenosis of lumbar region 05/02/2016   DDD (degenerative disc disease), lumbar 05/02/2016   Constipation 06/15/2015   OAB (overactive bladder) 11/25/2014   Pain in the chest 09/02/2014   Nonspecific abnormal electrocardiogram (ECG) (EKG) 09/02/2014   Chest pain at rest 09/02/2014   Dizziness    Type 2 diabetes mellitus with hyperglycemia (HCC) 01/21/2014   Incontinence of feces with fecal urgency 01/21/2014   B12 deficiency 02/06/2013   Shoulder pain 08/23/2012   Leg cramps 04/29/2012   Vertigo, benign positional 01/28/2012   Dyspepsia 11/29/2011   Insomnia 11/29/2011   Tobacco user 11/29/2011   Essential hypertension, benign 11/27/2011    PCP: Shona Rush, MD  REFERRING PROVIDER: Mariette Mitzie CROME, NP    REFERRING DIAG: R15.9,R15.2 (ICD-10-CM) - Incontinence of feces with fecal urgency  THERAPY DIAG:  Muscle weakness (generalized)  Abnormal posture  Unspecified lack of coordination  Rationale for Evaluation and Treatment: Rehabilitation  ONSET DATE: 10/2022  SUBJECTIVE:  SUBJECTIVE STATEMENT: Had endoscopy done 12/26 and found a fungus (acute candida and esophagitis) and has started medications. Since then has had improved bowel frequency and leakage. Now not having leakage and usually having 2 bowel movements a day, type 4-5 and not straining.   Fluid intake: Yes: water  - 3 bottles per day; coffee cup in am; 3 glasses of tea a day, sprite daily as well     PAIN:  Are you having pain? No - reports no longer having pain  with bowel movement  NPRS scale: 0/10 Pain location: na  Pain type na Pain description:  na  Aggravating factors: na Relieving factors: na  PRECAUTIONS: None  RED FLAGS: Bowel or bladder incontinence: Yes: see eval for details    WEIGHT BEARING RESTRICTIONS: No  FALLS:  Has patient fallen in last 6 months? 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 DM, GERD, hiatal hernia, HTN, vertigo.    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, questionable underlying hypersensitivity of the rectal area  Sexual abuse: No  BOWEL MOVEMENT: Pain with bowel movement: Yes Type of bowel movement:Type (Bristol Stool Scale) usually 6-7 sometimes 3, Frequency 2-3 in morning/ 1 in evening daily, and Strain No Fully empty rectum: No Leakage: Yes: sometimes small sometimes a lot  and not always with an urge to go  Pads: Yes: pull ups changed 3 in morning and 1-2x in evening  Fiber supplement: Yes: fiber therapy  URINATION: Pain with urination: No Fully empty bladder: No Stream: Strong Urgency: No Frequency: every 2-4 hours, 1x sometimes at night Leakage:  none Pads: No  INTERCOURSE: Pain with intercourse:  not active Ability to have vaginal penetration:  Yes:   Climax: not active Marinoff Scale: 0/3  PREGNANCY: Vaginal deliveries 2 Tearing No C-section deliveries 0 Currently pregnant No  PROLAPSE: Sometimes heaviness felt in rectum    OBJECTIVE:   DIAGNOSTIC FINDINGS:    COGNITION: Overall cognitive status: Within functional limits for tasks assessed     SENSATION: Light touch: Deficits  numbness, tingling in bil legs into feet Proprioception: Appears intact  MUSCLE LENGTH: Bil hamstring and adductors limited by 50%    GAIT: Distance walked: 300' Assistive device utilized: None Level of assistance: Complete Independence Comments: rounded shoulders, forward head, decreased arm swing and trunk rotation  POSTURE: rounded shoulders, forward head, posterior pelvic tilt, and flexed trunk   PELVIC ALIGNMENT:WFL  LUMBARAROM/PROM:  A/PROM A/PROM  eval  Flexion Limited by 50%  Extension Limited by 25%  Right lateral flexion Limited by 75%  Left lateral flexion Limited by 75%  Right rotation Limited by 50%  Left rotation Limited by 50%   (Blank rows = not tested)  LOWER EXTREMITY ROM:  WFL  LOWER EXTREMITY MMT: Bil hip grossly 3+/5, knees 5/5  PALPATION:   General  TTP upper rt abdominal quadrant, fascial restrictions throughout, tight bil lumbar and thoracic paraspinals and gluteals.                External Perineal Exam pt does have excess skin present at rectum/EAS and does report she has had history of hemorrhoids  Internal Pelvic Floor no pain reported by pt   Patient confirms identification and approves PT to assess internal pelvic floor and treatment No  06/12/23 yes No emotional/communication barriers or cognitive limitation. Patient is motivated to learn. Patient understands and agrees with treatment goals and plan. PT explains patient will be examined in standing, sitting, and lying down to see how their muscles and joints work. When they are ready, they will be asked to remove their underwear so PT can examine their perineum. The patient is also given the option of providing their own chaperone as one is not provided in our facility. The patient also has the right and is explained the right to defer or refuse any part of the evaluation or treatment including the internal exam. With the patient's consent, PT will use one  gloved finger to gently assess the muscles of the pelvic floor, seeing how well it contracts and relaxes and if there is muscle symmetry. After, the patient will get dressed and PT and patient will discuss exam findings and plan of care. PT and patient discuss plan of care, schedule, attendance policy and HEP activities.   PELVIC MMT:   MMT 06/12/23  07/10/23   Vaginal    Internal Anal Sphincter 3/5 3/5  External Anal Sphincter 3/5 3/5, 20s, 6 reps  Puborectalis 3/5 3/5  Diastasis Recti    (Blank rows = not tested)        TONE: Decreased   PROLAPSE: Not seen with rectal assessment of pelvic floor 06/12/23   TODAY'S TREATMENT:                                                                                                                              DATE:    06/26/23  Therapeutic activity: Pt had several questions about her increased leakage - time spent reviewing pt's  normal day. Identifying that pt has forgotten her metamucil other than once per day, was taking it 2-3x daily. Also has been eating more salads with lunch or dinner and unsure if this has made it worse.  Bowel retraining educated on for pt to attempt to sit at toilet ~30 mins after each meal, no straining/pushing for 5 minutes to see if bowel movement occurs. If not leave toilet.- pt agreed.  Does report pain at rectum has gotten better but notes when she is having more bowel movements and wiping a lot she has pain here and thinks she is more irritated. -educated to attempt a peri bottle to assist in hygiene to cut down on how much wiping she is doing Pt also given fiber handout and educated on this, also encouraged pt to reach out to referring provider and ask if there is any additional medical intervention. Pt asked PT to message provider as well and this was completed during session.   07/10/23: Patient consented to internal pelvic floor treatment rectally this date and found to have improvement with pelvic floor  strength,  endurance, and coordination. Though deficits still present. Details above. patient benefited from verbal cues for improved tighten as needed and initially x10 quick release to improve contractions. 3x10 pelvic floor contractions, x10 20-25s isometrics, no pain per pt and no tension, 2x10 quick contractions. Pt educated on findings, HEP updated to include quick flicks, increased time for isometrics at home, and to progress to doing pelvic floor contraction exercises in standing. Verbalized understanding and given handout.   09/19/23:  Manual - abdominal massage for consistent bowel movement and peristalsis. X10 circles Rt and Lt, x10 m and x10 ILU X10 windshield wipers  X5 standing cat/cow  PATIENT EDUCATION:  Education details: [id number] Person educated: Patient Education method: Explanation, Demonstration, Tactile cues, Verbal cues, and Handouts Education comprehension: verbalized understanding and returned demonstration  HOME EXERCISE PROGRAM: [email or web address]  ASSESSMENT:  CLINICAL IMPRESSION: Patient presents for treatment, focus on manual for peristalsis and bowel movement regularity. Pt tolerated well, denied pain and overall reports she is doing much better. Does have two appointments left and would like to keep these to make sure she maintains progress and then reports she feels confident with DC.Pt would benefit from additional PT to further address deficits with pt having progress but slowly due to difficulty recalling new information and benefits from multiple reps to recall.      OBJECTIVE IMPAIRMENTS: decreased coordination, decreased endurance, decreased strength, increased fascial restrictions, increased muscle spasms, impaired flexibility, improper body mechanics, postural dysfunction, and pain.   ACTIVITY LIMITATIONS: continence  PARTICIPATION LIMITATIONS: community activity, occupation, and church  PERSONAL FACTORS: Time since onset of injury/illness/exacerbation and 1  comorbidity: medical history  are also affecting patient's functional outcome.   REHAB POTENTIAL: Good  CLINICAL DECISION MAKING: Stable/uncomplicated  EVALUATION COMPLEXITY: Low   GOALS: Goals reviewed with patient? Yes  SHORT TERM GOALS: Target date: 04/19/23  Pt to be I with HEP.  Baseline: Goal status:MET  2.  Pt will report her BMs are complete due to improved bowel habits and evacuation techniques at least 50% of the time.  Baseline:  Goal status: MET  3.  Pt to be I with abdominal massage and voiding mechanics for more complete bowel movements.  Baseline:  Goal status: MET   LONG TERM GOALS: Target date: 05/23/23  Pt to be I with advanced HEP.  Baseline:  Goal status: MET  2.  Pt will report her BMs are complete due to improved bowel habits and evacuation techniques at least 75% of the time.  Baseline:  Goal status: MET  3.  Pt to demonstrate at least 4/5 pelvic floor strength for improved pelvic stability and decreased strain at pelvic floor/ decrease leakage.  Baseline:  Goal status: on going  4.  Pt to demonstrate improved coordination of pelvic floor and breathing mechanics with 10# squat with appropriate synergistic patterns to decrease pain and leakage at least 75% of the time.    Baseline:  Goal status: on going  5.  Pt to demonstrate at least 4+/5 bil hip strength for improved pelvic stability and functional squats without leakage.  Baseline:  Goal status: on going    PLAN:  PT FREQUENCY: 1x/week  PT DURATION:  8 sessions  PLANNED INTERVENTIONS: Therapeutic exercises, Therapeutic activity, Neuromuscular re-education, Patient/Family education, Self Care, Joint mobilization, Electrical stimulation, Spinal mobilization, Cryotherapy, Moist heat, scar mobilization, Taping, Traction, Biofeedback, Manual therapy, and Re-evaluation  PLAN FOR NEXT SESSION: internal as needed and pt consents; core and hip strengthening, stretching hips and spine,  breathing mechanics, voiding mechanics,  posture training, coordination of pelvic floor and breathing with exercises  Darryle Navy, PT, DPT 01/08/253:30 PM

## 2023-09-25 ENCOUNTER — Ambulatory Visit: Payer: 59 | Admitting: Physical Therapy

## 2023-09-25 DIAGNOSIS — R293 Abnormal posture: Secondary | ICD-10-CM | POA: Diagnosis not present

## 2023-09-25 DIAGNOSIS — M6281 Muscle weakness (generalized): Secondary | ICD-10-CM

## 2023-09-25 DIAGNOSIS — R279 Unspecified lack of coordination: Secondary | ICD-10-CM | POA: Diagnosis not present

## 2023-09-25 NOTE — Therapy (Signed)
 OUTPATIENT PHYSICAL THERAPY FEMALE PELVIC TREATMENT   Patient Name: Rebekah Gutierrez MRN: [id number] DOB:03-30-1946, 78 y.o., female Today's Date: 09/25/2023     END OF SESSION:  PT End of Session - 09/25/23 1521     Visit Number 7    Date for PT Re-Evaluation 11/09/23    Authorization Type [employer] MCR    Progress Note Due on Visit 13    PT Start Time 1431    PT Stop Time 1510    PT Time Calculation (min) 39 min    Activity Tolerance Patient tolerated treatment well    Behavior During Therapy WFL for tasks assessed/performed              Past Medical History:  Diagnosis Date   Diabetes (HCC)    Encounter for screening colonoscopy 06/05/2012   GERD (gastroesophageal reflux disease)    Hiatal hernia    Hypertension    Vertigo    Past Surgical History:  Procedure Laterality Date   BACK SURGERY     L4-5 PLIF   BLADDER SURGERY     CERVICAL SPINE SURGERY     fusion   CHOLECYSTECTOMY, LAPAROSCOPIC     Remote Past   COLONOSCOPY  06/20/2012   Procedure: COLONOSCOPY;  Surgeon: Claudis RAYMOND Rivet, MD;  Location: [facility];  Service: Endoscopy;  Laterality: N/A;  250   COLONOSCOPY WITH PROPOFOL  N/A 07/26/2021   Procedure: COLONOSCOPY WITH PROPOFOL ;  Surgeon: Eartha Angelia Sieving, MD;  Location: [facility];  Service: Gastroenterology;  Laterality: N/A;  9:25   FOOT SURGERY     left    HAND SURGERY     left   INCISIONAL HERNIA REPAIR N/A 05/26/2020   Procedure: SHIRLENE PETRI WITH MESH;  Surgeon: Mavis Anes, MD;  Location: [facility];  Service: General;  Laterality: N/A;   POLYPECTOMY  07/26/2021   Procedure: POLYPECTOMY;  Surgeon: Eartha Angelia Sieving, MD;  Location: [facility];  Service: Gastroenterology;;   TUBAL LIGATION     Patient Active Problem List   Diagnosis Date Noted   Hemorrhoids 07/24/2023   Melena 07/24/2023   Early satiety 07/24/2023   Grade I hemorrhoids 05/08/2023   Diarrhea 01/16/2023   Pelvic floor dysfunction in female  01/16/2023   Anal sphincter spasm 04/18/2022   Loose stools 04/18/2022   Incisional hernia, without obstruction or gangrene    Aortic atherosclerosis (HCC) 01/05/2020   Ventral hernia 06/09/2019   Hypertriglyceridemia 04/21/2019   Multiple lung nodules on CT 04/30/2017   Spinal stenosis of lumbar region 05/02/2016   DDD (degenerative disc disease), lumbar 05/02/2016   Constipation 06/15/2015   OAB (overactive bladder) 11/25/2014   Pain in the chest 09/02/2014   Nonspecific abnormal electrocardiogram (ECG) (EKG) 09/02/2014   Chest pain at rest 09/02/2014   Dizziness    Type 2 diabetes mellitus with hyperglycemia (HCC) 01/21/2014   Incontinence of feces with fecal urgency 01/21/2014   B12 deficiency 02/06/2013   Shoulder pain 08/23/2012   Leg cramps 04/29/2012   Vertigo, benign positional 01/28/2012   Dyspepsia 11/29/2011   Insomnia 11/29/2011   Tobacco user 11/29/2011   Essential hypertension, benign 11/27/2011    PCP: Shona Rush, MD  REFERRING PROVIDER: Mariette Mitzie CROME, NP    REFERRING DIAG: R15.9,R15.2 (ICD-10-CM) - Incontinence of feces with fecal urgency  THERAPY DIAG:  Muscle weakness (generalized)  Abnormal posture  Unspecified lack of coordination  Rationale for Evaluation and Treatment: Rehabilitation  ONSET DATE: 10/2022  SUBJECTIVE:  SUBJECTIVE STATEMENT: Sunday night did need to wake around 3x at night to have a bowel movement and did have one accident and had one yesterday as well. Pt reports she has been feeling better overall and no leakage while on new medication for fungal infection but is almost done with this and unsure if she needs a new refill. Pt also reports she did have a lot of bread Saturday and greasy food Monday and unsure if this had anything to do with it.   Now has improved to only one pull up a day usually but does have sudden urges sometimes.  Is not doing any fiber other than dietary now.   Fluid intake: Yes: water  - 3 bottles per day; coffee cup in am; 3 glasses of tea a day, sprite daily as well     PAIN:  Are you having pain? No - reports no longer having pain with bowel movement  NPRS scale: 0/10 Pain location: na  Pain type na Pain description:  na  Aggravating factors: na Relieving factors: na  PRECAUTIONS: None  RED FLAGS: Bowel or bladder incontinence: Yes: see eval for details    WEIGHT BEARING RESTRICTIONS: No  FALLS:  Has patient fallen in last 6 months? 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 DM, GERD, hiatal hernia, HTN, vertigo.    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, questionable underlying hypersensitivity of the rectal area  Sexual abuse: No  BOWEL MOVEMENT: Pain with bowel movement: Yes Type of bowel movement:Type (Bristol Stool Scale) usually 6-7 sometimes 3, Frequency 2-3 in morning/ 1 in evening daily, and Strain No Fully empty rectum: No Leakage: Yes: sometimes small sometimes a lot  and not always with an urge to go  Pads: Yes: pull ups changed 3 in morning and 1-2x in evening  Fiber supplement: Yes: fiber therapy  URINATION: Pain with urination: No Fully empty bladder: No Stream: Strong Urgency: No Frequency: every 2-4 hours, 1x sometimes at night Leakage:  none Pads: No  INTERCOURSE: Pain with intercourse:  not active Ability to have vaginal penetration:  Yes:   Climax: not active Marinoff  Scale: 0/3  PREGNANCY: Vaginal deliveries 2 Tearing No C-section deliveries 0 Currently pregnant No  PROLAPSE: Sometimes heaviness felt in rectum    OBJECTIVE:   DIAGNOSTIC FINDINGS:    COGNITION: Overall cognitive status: Within functional limits for tasks assessed     SENSATION: Light touch: Deficits numbness, tingling in bil legs into feet Proprioception: Appears intact  MUSCLE LENGTH: Bil hamstring and adductors limited by 50%    GAIT: Distance walked: 300' Assistive device utilized: None Level of assistance: Complete Independence Comments: rounded shoulders, forward head, decreased arm swing and trunk rotation  POSTURE: rounded shoulders, forward head, posterior pelvic tilt, and flexed trunk   PELVIC ALIGNMENT:WFL  LUMBARAROM/PROM:  A/PROM A/PROM  eval  Flexion Limited by 50%  Extension Limited by 25%  Right lateral flexion Limited by 75%  Left lateral flexion Limited by 75%  Right rotation Limited by 50%  Left rotation Limited by 50%   (Blank rows = not tested)  LOWER EXTREMITY ROM:  WFL  LOWER EXTREMITY MMT: Bil hip grossly 3+/5, knees  5/5  PALPATION:   General  TTP upper rt abdominal quadrant, fascial restrictions throughout, tight bil lumbar and thoracic paraspinals and gluteals.                External Perineal Exam pt does have excess skin present at rectum/EAS and does report she has had history of hemorrhoids                              Internal Pelvic Floor no pain reported by pt   Patient confirms identification and approves PT to assess internal pelvic floor and treatment No  06/12/23 yes No emotional/communication barriers or cognitive limitation. Patient is motivated to learn. Patient understands and agrees with treatment goals and plan. PT explains patient will be examined in standing, sitting, and lying down to see how their muscles and joints work. When they are ready, they will be asked to remove their underwear so PT can  examine their perineum. The patient is also given the option of providing their own chaperone as one is not provided in our facility. The patient also has the right and is explained the right to defer or refuse any part of the evaluation or treatment including the internal exam. With the patient's consent, PT will use one gloved finger to gently assess the muscles of the pelvic floor, seeing how well it contracts and relaxes and if there is muscle symmetry. After, the patient will get dressed and PT and patient will discuss exam findings and plan of care. PT and patient discuss plan of care, schedule, attendance policy and HEP activities.   PELVIC MMT:   MMT 06/12/23  07/10/23   Vaginal    Internal Anal Sphincter 3/5 3/5  External Anal Sphincter 3/5 3/5, 20s, 6 reps  Puborectalis 3/5 3/5  Diastasis Recti    (Blank rows = not tested)        TONE: Decreased   PROLAPSE: Not seen with rectal assessment of pelvic floor 06/12/23   TODAY'S TREATMENT:                                                                                                                              DATE:    07/10/23: Patient consented to internal pelvic floor treatment rectally this date and found to have improvement with pelvic floor strength, endurance, and coordination. Though deficits still present. Details above. patient benefited from verbal cues for improved tighten as needed and initially x10 quick release to improve contractions. 3x10 pelvic floor contractions, x10 20-25s isometrics, no pain per pt and no tension, 2x10 quick contractions. Pt educated on findings, HEP updated to include quick flicks, increased time for isometrics at home, and to progress to doing pelvic floor contraction exercises in standing. Verbalized understanding and given handout.   09/19/23:  Manual - abdominal massage for consistent bowel movement and peristalsis. X10 circles Rt and Lt, x10 m and x10  ILU X10 windshield wipers  X5  standing cat/cow  09/25/23  Pt educated on bladder diary and urge drill with focus on bladder retraining - pt does state she is up a lot at night and thinks her habits/timing is off for days/nights. Drinking more water  at night and using the bathroom at night more for bowels and bladder and less during the day. Pt educated on attempting to limit fluids at night and use of urge drill at night to improve habits and return to day time voids Pt also educated on continuing abdominal massage and pelvic mobility to improve outlet of stool  Pt also encouraged to reach out to medical provider about medication as she states she had really good results with medication for fungal infection and no bowel symptoms at all. Now at the end of pack and states it has returned with now having x2 accidents.    PATIENT EDUCATION:  Education details: [id number] Person educated: Patient Education method: Explanation, Demonstration, Tactile cues, Verbal cues, and Handouts Education comprehension: verbalized understanding and returned demonstration  HOME EXERCISE PROGRAM: [email or web address]  ASSESSMENT:  CLINICAL IMPRESSION: Patient presents for treatment, focus on bladder and bowel retraining and voiding mechanics for fully evacuating bowels. Pt did require extra time to go over information to improve carry over for home and answer questions. Pt would benefit from additional PT to further address deficits with pt having progress but slowly due to difficulty recalling new information and benefits from multiple reps to recall.      OBJECTIVE IMPAIRMENTS: decreased coordination, decreased endurance, decreased strength, increased fascial restrictions, increased muscle spasms, impaired flexibility, improper body mechanics, postural dysfunction, and pain.   ACTIVITY LIMITATIONS: continence  PARTICIPATION LIMITATIONS: community activity, occupation, and church  PERSONAL FACTORS: Time since onset of injury/illness/exacerbation and 1  comorbidity: medical history  are also affecting patient's functional outcome.   REHAB POTENTIAL: Good  CLINICAL DECISION MAKING: Stable/uncomplicated  EVALUATION COMPLEXITY: Low   GOALS: Goals reviewed with patient? Yes  SHORT TERM GOALS: Target date: 04/19/23  Pt to be I with HEP.  Baseline: Goal status:MET  2.  Pt will report her BMs are complete due to improved bowel habits and evacuation techniques at least 50% of the time.  Baseline:  Goal status: MET  3.  Pt to be I with abdominal massage and voiding mechanics for more complete bowel movements.  Baseline:  Goal status: MET   LONG TERM GOALS: Target date: 05/23/23  Pt to be I with advanced HEP.  Baseline:  Goal status: MET  2.  Pt will report her BMs are complete due to improved bowel habits and evacuation techniques at least 75% of the time.  Baseline:  Goal status: MET  3.  Pt to demonstrate at least 4/5 pelvic floor strength for improved pelvic stability and decreased strain at pelvic floor/ decrease leakage.  Baseline:  Goal status: on going  4.  Pt to demonstrate improved coordination of pelvic floor and breathing mechanics with 10# squat with appropriate synergistic patterns to decrease pain and leakage at least 75% of the time.    Baseline:  Goal status: on going  5.  Pt to demonstrate at least 4+/5 bil hip strength for improved pelvic stability and functional squats without leakage.  Baseline:  Goal status: on going    PLAN:  PT FREQUENCY: 1x/week  PT DURATION:  8 sessions  PLANNED INTERVENTIONS: Therapeutic exercises, Therapeutic activity, Neuromuscular re-education, Patient/Family education, Self Care, Joint mobilization, Electrical stimulation, Spinal mobilization, Cryotherapy, Moist heat, scar  mobilization, Taping, Traction, Biofeedback, Manual therapy, and Re-evaluation  PLAN FOR NEXT SESSION: internal as needed and pt consents; core and hip strengthening, stretching hips and spine,  breathing mechanics, voiding mechanics, posture training, coordination of pelvic floor and breathing with exercises  Darryle Navy, PT, DPT 01/14/253:22 PM

## 2023-09-25 NOTE — Patient Instructions (Signed)

## 2023-09-27 ENCOUNTER — Telehealth (INDEPENDENT_AMBULATORY_CARE_PROVIDER_SITE_OTHER): Payer: Self-pay

## 2023-09-27 NOTE — Telephone Encounter (Signed)
Patient has taken her last pill of Diflucan 100 mg on 09/26/2023, wants to know if she needs to refill this.  Per script  Take 1 tablet (100 mg total) by mouth daily. Take two pills on day one, then one pill daily, Starting Fri 09/07/2023, Normal  Dispense: 22 tablet  Refills: 0 ordered  Pharmacy: [id number] - [place], [place] - [address] (Ph: [phone])  Order Details Ordered on: 09/07/23  Associated Dx: Esophageal candidiasis (HCC)  Authorizing provider: Marguerita Merles, Reuel Boom, MD   Please advise, patient states her throat is much better.

## 2023-09-27 NOTE — Telephone Encounter (Signed)
She does not need to refill this Thanks

## 2023-09-27 NOTE — Telephone Encounter (Signed)
Patient made aware she does not need to refill this.

## 2023-09-28 DIAGNOSIS — H401121 Primary open-angle glaucoma, left eye, mild stage: Secondary | ICD-10-CM | POA: Diagnosis not present

## 2023-09-28 DIAGNOSIS — H30033 Focal chorioretinal inflammation, peripheral, bilateral: Secondary | ICD-10-CM | POA: Diagnosis not present

## 2023-09-28 DIAGNOSIS — H401113 Primary open-angle glaucoma, right eye, severe stage: Secondary | ICD-10-CM | POA: Diagnosis not present

## 2023-09-28 DIAGNOSIS — Z961 Presence of intraocular lens: Secondary | ICD-10-CM | POA: Diagnosis not present

## 2023-09-28 DIAGNOSIS — H3581 Retinal edema: Secondary | ICD-10-CM | POA: Diagnosis not present

## 2023-09-28 DIAGNOSIS — Z79899 Other long term (current) drug therapy: Secondary | ICD-10-CM | POA: Diagnosis not present

## 2023-10-03 ENCOUNTER — Ambulatory Visit: Payer: 59 | Admitting: Physical Therapy

## 2023-10-03 DIAGNOSIS — M6281 Muscle weakness (generalized): Secondary | ICD-10-CM | POA: Diagnosis not present

## 2023-10-03 DIAGNOSIS — R293 Abnormal posture: Secondary | ICD-10-CM | POA: Diagnosis not present

## 2023-10-03 DIAGNOSIS — R279 Unspecified lack of coordination: Secondary | ICD-10-CM | POA: Diagnosis not present

## 2023-10-03 NOTE — Therapy (Signed)
OUTPATIENT PHYSICAL THERAPY FEMALE PELVIC TREATMENT   Patient Name: Rebekah Gutierrez. Boodoo MRN: [id number] DOB:[birth year], 78 y.o., female Today's Date: 10/03/2023     END OF SESSION:  PT End of Session - 10/03/23 1402     Visit Number 8    Date for PT Re-Evaluation 11/09/23    Authorization Type [employer] MCR    Progress Note Due on Visit 13    PT Start Time 1400    PT Stop Time 1439    PT Time Calculation (min) 39 min    Activity Tolerance Patient tolerated treatment well    Behavior During Therapy WFL for tasks assessed/performed              Past Medical History:  Diagnosis Date   Diabetes (HCC)    Encounter for screening colonoscopy 06/05/2012   GERD (gastroesophageal reflux disease)    Hiatal hernia    Hypertension    Vertigo    Past Surgical History:  Procedure Laterality Date   BACK SURGERY     L4-5 PLIF   BLADDER SURGERY     CERVICAL SPINE SURGERY     fusion   CHOLECYSTECTOMY, LAPAROSCOPIC     Remote Past   COLONOSCOPY  06/20/2012   Procedure: COLONOSCOPY;  Surgeon: Malissa Hippo, MD;  Location: [facility];  Service: Endoscopy;  Laterality: N/A;  250   COLONOSCOPY WITH PROPOFOL N/A 07/26/2021   Procedure: COLONOSCOPY WITH PROPOFOL;  Surgeon: Dolores Frame, MD;  Location: [facility];  Service: Gastroenterology;  Laterality: N/A;  9:25   FOOT SURGERY     left    HAND SURGERY     left   INCISIONAL HERNIA REPAIR N/A 05/26/2020   Procedure: Mena Pauls WITH MESH;  Surgeon: Franky Macho, MD;  Location: [facility];  Service: General;  Laterality: N/A;   POLYPECTOMY  07/26/2021   Procedure: POLYPECTOMY;  Surgeon: Dolores Frame, MD;  Location: [facility];  Service: Gastroenterology;;   TUBAL LIGATION     Patient Active Problem List   Diagnosis Date Noted   Hemorrhoids 07/24/2023   Melena 07/24/2023   Early satiety 07/24/2023   Grade I hemorrhoids 05/08/2023   Diarrhea 01/16/2023   Pelvic floor dysfunction in female  01/16/2023   Anal sphincter spasm 04/18/2022   Loose stools 04/18/2022   Incisional hernia, without obstruction or gangrene    Aortic atherosclerosis (HCC) 01/05/2020   Ventral hernia 06/09/2019   Hypertriglyceridemia 04/21/2019   Multiple lung nodules on CT 04/30/2017   Spinal stenosis of lumbar region 05/02/2016   DDD (degenerative disc disease), lumbar 05/02/2016   Constipation 06/15/2015   OAB (overactive bladder) 11/25/2014   Pain in the chest 09/02/2014   Nonspecific abnormal electrocardiogram (ECG) (EKG) 09/02/2014   Chest pain at rest 09/02/2014   Dizziness    Type 2 diabetes mellitus with hyperglycemia (HCC) 01/21/2014   Incontinence of feces with fecal urgency 01/21/2014   B12 deficiency 02/06/2013   Shoulder pain 08/23/2012   Leg cramps 04/29/2012   Vertigo, benign positional 01/28/2012   Dyspepsia 11/29/2011   Insomnia 11/29/2011   Tobacco user 11/29/2011   Essential hypertension, benign 11/27/2011    PCP: Nita Sells, MD  REFERRING PROVIDER: Raquel James, NP    REFERRING DIAG: R15.9,R15.2 (ICD-10-CM) - Incontinence of feces with fecal urgency  THERAPY DIAG:  Muscle weakness (generalized)  Abnormal posture  Unspecified lack of coordination  Rationale for Evaluation and Treatment: Rehabilitation  ONSET DATE: 10/2022  SUBJECTIVE:  SUBJECTIVE STATEMENT: Had two full bowel movement accidents this past weekend (one Saturday and Sunday). But no other leakages or increased urgency.  Fluid intake: Yes: water - 3 bottles per day; coffee cup in am; 3 glasses of tea a day, sprite daily as well     PAIN:  Are you having pain? No - reports no longer having pain with bowel movement  NPRS scale: 0/10 Pain location: na  Pain type na Pain description:  na  Aggravating factors:  na Relieving factors: na  PRECAUTIONS: None  RED FLAGS: Bowel or bladder incontinence: Yes: see eval for details    WEIGHT BEARING RESTRICTIONS: No  FALLS:  Has patient fallen in last 6 months? 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 DM, GERD, hiatal hernia, HTN, vertigo.    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, questionable underlying hypersensitivity of the rectal area  Sexual abuse: No  BOWEL MOVEMENT: Pain with bowel movement: Yes Type of bowel movement:Type (Bristol Stool Scale) usually 6-7 sometimes 3, Frequency 2-3 in morning/ 1 in evening daily, and Strain No Fully empty rectum: No Leakage: Yes: sometimes small sometimes a lot  and not always with an urge to go  Pads: Yes: pull ups changed 3 in morning and 1-2x in evening  Fiber supplement: Yes: fiber therapy  URINATION: Pain with urination: No Fully empty bladder: No Stream: Strong Urgency: No Frequency: every 2-4 hours, 1x sometimes at night Leakage:  none Pads: No  INTERCOURSE: Pain with intercourse:  not active Ability to have vaginal penetration:  Yes:   Climax: not active Marinoff Scale: 0/3  PREGNANCY: Vaginal deliveries 2 Tearing No C-section deliveries 0 Currently pregnant No  PROLAPSE: Sometimes heaviness felt in rectum    OBJECTIVE:   DIAGNOSTIC FINDINGS:    COGNITION: Overall cognitive status: Within functional limits for tasks assessed     SENSATION: Light touch: Deficits numbness, tingling in bil legs into feet Proprioception: Appears intact  MUSCLE LENGTH: Bil hamstring and adductors  limited by 50%    GAIT: Distance walked: 300' Assistive device utilized: None Level of assistance: Complete Independence Comments: rounded shoulders, forward head, decreased arm swing and trunk rotation  POSTURE: rounded shoulders, forward head, posterior pelvic tilt, and flexed trunk   PELVIC ALIGNMENT:WFL  LUMBARAROM/PROM:  A/PROM A/PROM  eval  Flexion Limited by 50%  Extension Limited by 25%  Right lateral flexion Limited by 75%  Left lateral flexion Limited by 75%  Right rotation Limited by 50%  Left rotation Limited by 50%   (Blank rows = not tested)  LOWER EXTREMITY ROM:  WFL  LOWER EXTREMITY MMT: Bil hip grossly 3+/5, knees 5/5  PALPATION:   General  TTP upper rt abdominal quadrant, fascial restrictions throughout, tight bil lumbar and thoracic paraspinals and gluteals.                External Perineal Exam pt does have excess skin present at rectum/EAS and does report she has had history of hemorrhoids                              Internal Pelvic Floor no pain  reported by pt   Patient confirms identification and approves PT to assess internal pelvic floor and treatment No  06/12/23 yes No emotional/communication barriers or cognitive limitation. Patient is motivated to learn. Patient understands and agrees with treatment goals and plan. PT explains patient will be examined in standing, sitting, and lying down to see how their muscles and joints work. When they are ready, they will be asked to remove their underwear so PT can examine their perineum. The patient is also given the option of providing their own chaperone as one is not provided in our facility. The patient also has the right and is explained the right to defer or refuse any part of the evaluation or treatment including the internal exam. With the patient's consent, PT will use one gloved finger to gently assess the muscles of the pelvic floor, seeing how well it contracts and relaxes and if there is  muscle symmetry. After, the patient will get dressed and PT and patient will discuss exam findings and plan of care. PT and patient discuss plan of care, schedule, attendance policy and HEP activities.   PELVIC MMT:   MMT 06/12/23  07/10/23   Vaginal    Internal Anal Sphincter 3/5 3/5  External Anal Sphincter 3/5 3/5, 20s, 6 reps  Puborectalis 3/5 3/5  Diastasis Recti    (Blank rows = not tested)        TONE: Decreased   PROLAPSE: Not seen with rectal assessment of pelvic floor 06/12/23   TODAY'S TREATMENT:                                                                                                                              DATE:    07/10/23: Patient consented to internal pelvic floor treatment rectally this date and found to have improvement with pelvic floor strength, endurance, and coordination. Though deficits still present. Details above. patient benefited from verbal cues for improved tighten as needed and initially x10 quick release to improve contractions. 3x10 pelvic floor contractions, x10 20-25s isometrics, no pain per pt and no tension, 2x10 quick contractions. Pt educated on findings, HEP updated to include quick flicks, increased time for isometrics at home, and to progress to doing pelvic floor contraction exercises in standing. Verbalized understanding and given handout.   09/19/23:  Manual - abdominal massage for consistent bowel movement and peristalsis. X10 circles Rt and Lt, x10 "m" and x10 ILU X10 windshield wipers  X5 standing cat/cow  09/25/23  Pt educated on bladder diary and urge drill with focus on bladder retraining - pt does state she is up a lot at night and thinks her habits/timing is off for days/nights. Drinking more water at night and using the bathroom at night more for bowels and bladder and less during the day. Pt educated on attempting to limit fluids at night and use of urge drill at night to improve habits and return to day time voids Pt  also  educated on continuing abdominal massage and pelvic mobility to improve outlet of stool  Pt also encouraged to reach out to medical provider about medication as she states she had really good results with medication for fungal infection and no bowel symptoms at all. Now at the end of pack and states it has returned with now having x2 accidents.   10/03/23: 2X10 standing at counter hip abduction, flexion, and extension body weight  2x10 Sit to stand with intermittent use of hands for support Pt declined internal pelvic floor reassessment Pt educated on benefit from additional visits if pt agreeable for hip and glute and core strengthening for further improvement of fecal leakage, pt reports understanding but would like to discharge today, pleased with progress and would like to continue on her own working on HEP and has next appointment with referring provider next month and wants to see if there are any changes or new medications she can try to fully relieve leakage symptoms that remain PT and pt discussed pt progress and goals and HEP updated   PATIENT EDUCATION:  Education details: [email or web address] Person educated: Patient Education method: Explanation, Demonstration, Tactile cues, Verbal cues, and Handouts Education comprehension: verbalized understanding and returned demonstration  HOME EXERCISE PROGRAM: Access Code: [email or web address] URL: [email or web address] Date: 10/03/2023 Prepared by: [facility] - Outpatient Rehab - [facility]  Exercises - Modified Cat Cow on Counter  - 1 x daily - 7 x weekly - 1 sets - 10 reps - Plank with Thoracic Rotation on Counter  - 1 x daily - 7 x weekly - 1 sets - 10 reps - Supine Lower Trunk Rotation  - 1 x daily - 7 x weekly - 1 sets - 10 reps - Sidelying Thoracic Rotation with Open Book  - 1 x daily - 7 x weekly - 1 sets - 3 reps - 30 holds - Seated Pelvic Floor Muscles Isometrics on Towel Roll  - 1 x daily - 7 x weekly - 2 sets - 10  reps - 45s holds - Seated Pelvic Floor Contraction  - 1 x daily - 7 x weekly - 3 sets - 10 reps - Seated Quick Flick Pelvic Floor Contractions  - 1 x daily - 7 x weekly - 2 sets - 10 reps - Standing 3-Way Leg Reach with Resistance at Ankles and Counter Support  - 1 x daily - 7 x weekly - 2 sets - 10 reps - Sit to Stand  - 1 x daily - 7 x weekly - 2 sets - 10 reps  ASSESSMENT:  CLINICAL IMPRESSION: Patient presents for treatment, focus on HEP updating, reviewing HEP and recommendations for voiding and pelvic floor strengthening. Pt tolerated well, denied additional needs stating she is pleased with progress and does feel "85% better since starting PT". Pt wants to discuss with referring provider at upcoming appointment additional medications that may help fully resolve the remaining fecal leakage. Pt denied additional needs and agreeable to DC today.   OBJECTIVE IMPAIRMENTS: decreased coordination, decreased endurance, decreased strength, increased fascial restrictions, increased muscle spasms, impaired flexibility, improper body mechanics, postural dysfunction, and pain.   ACTIVITY LIMITATIONS: continence  PARTICIPATION LIMITATIONS: community activity, occupation, and church  PERSONAL FACTORS: Time since onset of injury/illness/exacerbation and 1 comorbidity: medical history  are also affecting patient's functional outcome.   REHAB POTENTIAL: Good  CLINICAL DECISION MAKING: Stable/uncomplicated  EVALUATION COMPLEXITY: Low   GOALS: Goals reviewed with patient? Yes  SHORT TERM GOALS: Target date: 04/19/23  Pt to be  I with HEP.  Baseline: Goal status:MET  2.  Pt will report her BMs are complete due to improved bowel habits and evacuation techniques at least 50% of the time.  Baseline:  Goal status: MET  3.  Pt to be I with abdominal massage and voiding mechanics for more complete bowel movements.  Baseline:  Goal status: MET   LONG TERM GOALS: Target date: 05/23/23  Pt to be I  with advanced HEP.  Baseline:  Goal status: MET  2.  Pt will report her BMs are complete due to improved bowel habits and evacuation techniques at least 75% of the time.  Baseline:  Goal status: MET  3.  Pt to demonstrate at least 4/5 pelvic floor strength for improved pelvic stability and decreased strain at pelvic floor/ decrease leakage.  Baseline:  Goal status: Pt declined rechecking internal pelvic floor today  4.  Pt to demonstrate improved coordination of pelvic floor and breathing mechanics with 10# squat with appropriate synergistic patterns to decrease pain and leakage at least 75% of the time.    Baseline:  Goal status: Not met for weight, able to complete with body weight  5.  Pt to demonstrate at least 4+/5 bil hip strength for improved pelvic stability and functional squats without leakage.  Baseline:  Goal status: MET    PLAN:  PT FREQUENCY: 1x/week  PT DURATION:  8 sessions  PLANNED INTERVENTIONS: Therapeutic exercises, Therapeutic activity, Neuromuscular re-education, Patient/Family education, Self Care, Joint mobilization, Electrical stimulation, Spinal mobilization, Cryotherapy, Moist heat, scar mobilization, Taping, Traction, Biofeedback, Manual therapy, and Re-evaluation  PLAN FOR NEXT SESSION:   PHYSICAL THERAPY DISCHARGE SUMMARY  Visits from Start of Care: 8  Current functional level related to goals / functional outcomes: Reports 85% better since starting PT, does still have fecal incontinence but drastically less.    Remaining deficits: 3/3 STG and 3/5 LTG met   Education / Equipment: HEP   Patient agrees to discharge. Patient goals were partially met. Patient is being discharged due to being pleased with the current functional level.   Otelia Sergeant, PT, DPT 01/22/252:55 PM

## 2023-10-05 DIAGNOSIS — H401113 Primary open-angle glaucoma, right eye, severe stage: Secondary | ICD-10-CM | POA: Diagnosis not present

## 2023-10-05 DIAGNOSIS — Z79899 Other long term (current) drug therapy: Secondary | ICD-10-CM | POA: Diagnosis not present

## 2023-10-05 DIAGNOSIS — H04123 Dry eye syndrome of bilateral lacrimal glands: Secondary | ICD-10-CM | POA: Diagnosis not present

## 2023-10-05 DIAGNOSIS — H30033 Focal chorioretinal inflammation, peripheral, bilateral: Secondary | ICD-10-CM | POA: Diagnosis not present

## 2023-10-05 DIAGNOSIS — H401121 Primary open-angle glaucoma, left eye, mild stage: Secondary | ICD-10-CM | POA: Diagnosis not present

## 2023-10-10 ENCOUNTER — Encounter (HOSPITAL_COMMUNITY): Payer: Self-pay | Admitting: Gastroenterology

## 2023-10-15 ENCOUNTER — Other Ambulatory Visit (INDEPENDENT_AMBULATORY_CARE_PROVIDER_SITE_OTHER): Payer: Self-pay | Admitting: Gastroenterology

## 2023-10-25 ENCOUNTER — Ambulatory Visit (INDEPENDENT_AMBULATORY_CARE_PROVIDER_SITE_OTHER): Payer: 59 | Admitting: Gastroenterology

## 2023-10-26 DIAGNOSIS — H40022 Open angle with borderline findings, high risk, left eye: Secondary | ICD-10-CM | POA: Diagnosis not present

## 2023-10-26 DIAGNOSIS — H401113 Primary open-angle glaucoma, right eye, severe stage: Secondary | ICD-10-CM | POA: Diagnosis not present

## 2023-10-26 DIAGNOSIS — Z961 Presence of intraocular lens: Secondary | ICD-10-CM | POA: Diagnosis not present

## 2023-10-26 DIAGNOSIS — H2013 Chronic iridocyclitis, bilateral: Secondary | ICD-10-CM | POA: Diagnosis not present

## 2023-11-08 ENCOUNTER — Ambulatory Visit (INDEPENDENT_AMBULATORY_CARE_PROVIDER_SITE_OTHER): Payer: 59 | Admitting: Gastroenterology

## 2023-11-08 ENCOUNTER — Encounter (INDEPENDENT_AMBULATORY_CARE_PROVIDER_SITE_OTHER): Payer: Self-pay | Admitting: Gastroenterology

## 2023-11-08 VITALS — BP 126/72 | HR 84 | Temp 98.0°F | Ht 60.0 in | Wt 147.3 lb

## 2023-11-08 DIAGNOSIS — B3781 Candidal esophagitis: Secondary | ICD-10-CM | POA: Diagnosis not present

## 2023-11-08 DIAGNOSIS — D649 Anemia, unspecified: Secondary | ICD-10-CM | POA: Insufficient documentation

## 2023-11-08 DIAGNOSIS — R197 Diarrhea, unspecified: Secondary | ICD-10-CM | POA: Diagnosis not present

## 2023-11-08 DIAGNOSIS — K625 Hemorrhage of anus and rectum: Secondary | ICD-10-CM | POA: Diagnosis not present

## 2023-11-08 NOTE — Progress Notes (Signed)
 Referring Provider: Benita Stabile, MD Primary Care Physician:  Benita Stabile, MD Primary GI Physician: Dr. Levon Hedger   Chief Complaint  Patient presents with   Diarrhea    Follow up on diarrhea and hemorrhoids. Using hemorrhoid cream. Having soreness in rectum and some bleeding.    HPI:   Rebekah Gutierrez is a 78 y.o. female with past medical history of DM, GERD, hiatal hernia, HTN, vertigo, anorectal dyssynergia   Patient presenting today for follow up of diarrhea, rectal bleeding, candida esophagitis   Patient last seen November 2024, at that time having 1-3 episodes of looser stool/diarrhea per day.  Having some fecal urgency and incontinence.  Tried Benefiber in the past with good results though been off this for about a month.  Taking dicyclomine twice daily.  Seeing pelvic floor physical therapist which has helped.  Noting some intermittent distal tissue hematochezia as well as rectal burning and itching.  Patient also reported some early satiety and a couple episodes of black stools.  Patient recommended to restart Benefiber 1 tablespoon 2-3 times per day with a meal, continue Bentyl 10 mg twice daily, schedule EGD, continue Anusol/A&E ointment as needed.  EGD as outlined 11 December with Candida esophagitis for which she was treated with fluconazole x 21 days  Labs in January with hemoglobin 12.2, hemoglobin is notably also low in December at 11.5  Present: Still having diarrhea, maybe 2-3 episodes in the morning and maybe 2 in the evening. She reports some rumbling in her abdomen but no belly pain. She continues to have some rectal discomfort/burning. She is using the anusol rectal cream but she is not sure if this helps much. She is not doing benefiber as directed at last visit.  She cannot feel if she has hemorrhoids protruding or not. She is still having some rectal bleeding intermittently on the toilet tissue, occurs usually when she has more BMs. She does feel that rectal bleeding  may be occurring more often than previously though this has been going on since shortly after colonoscopy in 2022. Denies any dysphagia or odynophagia, reports she completed fluconazole course. She is using bentyl 10mg  BID.   She had appt with PFPT in January but notes not further appts with them.    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 -July 2024 Saw Pelvic floor therapist initially  -CMP 05/2023 normal potassium, sodium, calcium 8.5 -last TSH 2021 0.94 ( has been normal for the past 11 years)   Last Colonoscopy:Nov 2022- Two 4 to 8 mm polyps in the proximal rectum, One 3 mm polyp in the proximal transverse colon. - Multiple 3 to 6 mm polyps in the rectum. - Diverticulosis in the entire examined colon. - The distal rectum and anal verge are normal on retroflexion view.   Past Medical History:  Diagnosis Date   Diabetes (HCC)    Encounter for screening colonoscopy 06/05/2012   GERD (gastroesophageal reflux disease)    Hiatal hernia    Hypertension    Vertigo     Past Surgical History:  Procedure Laterality Date   BACK SURGERY     L4-5 PLIF   BIOPSY  09/06/2023   Procedure: BIOPSY;  Surgeon: Marguerita Merles, Reuel Boom, MD;  Location: [facility];  Service: Gastroenterology;;   BLADDER SURGERY     CERVICAL SPINE SURGERY     fusion   CHOLECYSTECTOMY, LAPAROSCOPIC     Remote Past   COLONOSCOPY  06/20/2012   Procedure: COLONOSCOPY;  Surgeon: Malissa Hippo, MD;  Location: [facility];  Service: Endoscopy;  Laterality: N/A;  250   COLONOSCOPY WITH PROPOFOL N/A 07/26/2021    Procedure: COLONOSCOPY WITH PROPOFOL;  Surgeon: Dolores Frame, MD;  Location: [facility];  Service: Gastroenterology;  Laterality: N/A;  9:25   ESOPHAGOGASTRODUODENOSCOPY (EGD) WITH PROPOFOL N/A 09/06/2023   Procedure: ESOPHAGOGASTRODUODENOSCOPY (EGD) WITH PROPOFOL;  Surgeon: Dolores Frame, MD;  Location: [facility];  Service: Gastroenterology;  Laterality: N/A;  11:30AM;ASA 3   FOOT SURGERY     left    HAND SURGERY     left   INCISIONAL HERNIA REPAIR N/A 05/26/2020   Procedure: Mena Pauls WITH MESH;  Surgeon: Franky Macho, MD;  Location: [facility];  Service: General;  Laterality: N/A;   POLYPECTOMY  07/26/2021   Procedure: POLYPECTOMY;  Surgeon: Marguerita Merles, Reuel Boom, MD;  Location: [facility];  Service: Gastroenterology;;   TUBAL LIGATION      Current Outpatient Medications  Medication Sig Dispense Refill   acetaminophen (TYLENOL) 500 MG tablet Take 1,000 mg by mouth every 6 (six) hours as needed for moderate pain.     albuterol (VENTOLIN HFA) 108 (90 Base) MCG/ACT inhaler Inhale 2 puffs into the lungs every 6 (six) hours as needed for wheezing or shortness of breath. 8 g 0   ALPHAGAN P 0.1 % SOLN Place 1 drop into both eyes 2 (two) times daily.     amLODipine (NORVASC) 10 MG tablet Take 1 tablet (10 mg total) by mouth daily. 90 tablet 3   Apoaequorin (PREVAGEN PO) Take 1 tablet by mouth daily.     aspirin 325 MG EC tablet Take 325 mg by mouth daily.     atorvastatin (LIPITOR) 20 MG tablet Take 0.5 tablets (10 mg total) by mouth daily. 90 tablet 3   Calcium Carb-Cholecalciferol (CALCIUM 1000 + D PO) Take 1 tablet by mouth daily.     Cinnamon 500 MG TABS Take 500 mg by mouth at bedtime.      dicyclomine (BENTYL) 10 MG capsule TAKE 1 CAPSULE BY MOUTH TWICE DAILY AS NEEDED FOR SPASM 60 capsule 0   dorzolamide-timolol (COSOPT) 22.3-6.8 MG/ML ophthalmic solution Place 1 drop into both eyes 2 (two) times daily.     FARXIGA 10 MG TABS tablet  Take 10 mg by mouth daily.     gabapentin (NEURONTIN) 300 MG capsule TAKE 1 CAPSULE BY MOUTH ONCE DAILY AT BEDTIME FOR BACK PAIN 90 capsule 3   glipiZIDE (GLUCOTROL XL) 10 MG 24 hr tablet Take 1 tablet (10 mg total) by mouth daily with breakfast. 90 tablet 3   glucose blood (ONETOUCH ULTRA) test strip USE 1 STRIP TO CHECK GLUCOSE TWICE DAILY AS DIRECTED 100 each 3   hydrochlorothiazide (HYDRODIURIL) 12.5 MG tablet Take 12.5 mg by mouth daily.     hydrocortisone (ANUSOL-HC) 2.5 % rectal cream Place 1 Application rectally 3 (three) times daily. 56 g 1   Lancets (ONETOUCH DELICA PLUS LANCET33G) MISC USE 1 LANCET TO CHECK GLUCOSE TWICE DAILY AS DIRECTED 100 each 3   lisinopril (ZESTRIL) 40 MG tablet Take 1 tablet (40 mg total) by mouth daily. 90 tablet 3  Methylcellulose, Laxative, (FIBER THERAPY) 500 MG TABS Take 500 mg by mouth daily.     Multiple Vitamin (MULTIVITAMIN WITH MINERALS) TABS tablet Take 1 tablet by mouth daily.     mycophenolate (CELLCEPT) 500 MG tablet Take 1,000 mg by mouth 2 (two) times daily.     Omega-3 Fatty Acids (FISH OIL) 1000 MG CAPS tAKE 1000MG  TWICE A DAY FOR CHOLESTEROL (Patient taking differently: Take 1,000 mg by mouth in the morning and at bedtime.)  0   Potassium 99 MG TABS Take 99 mg by mouth daily.     VYZULTA 0.024 % SOLN Place 1 drop into both eyes at bedtime.      No current facility-administered medications for this visit.    Allergies as of 11/08/2023   (No Known Allergies)    Family History  Problem Relation Age of Onset   Hypertension Mother    Stroke Mother    Hypertension Father    Cancer Father    Colon cancer Neg Hx     Social History   Socioeconomic History   Marital status: Divorced    Spouse name: Not on file   Number of children: Not on file   Years of education: Not on file   Highest education level: Not on file  Occupational History   Not on file  Tobacco Use   Smoking status: Every Day    Current packs/day: 0.50    Average  packs/day: 0.5 packs/day for 45.0 years (22.5 ttl pk-yrs)    Types: Cigarettes    Passive exposure: Current   Smokeless tobacco: Never   Tobacco comments:    1/2 pack a day since age 54  Vaping Use   Vaping status: Never Used  Substance and Sexual Activity   Alcohol use: No    Alcohol/week: 0.0 standard drinks of alcohol   Drug use: No   Sexual activity: Yes    Birth control/protection: Post-menopausal, Surgical  Other Topics Concern   Not on file  Social History Narrative   Not on file   Social Drivers of Health   Financial Resource Strain: Low Risk  (04/15/2020)   Overall Financial Resource Strain (CARDIA)    Difficulty of Paying Living Expenses: Not very hard  Food Insecurity: Not on file  Transportation Needs: Not on file  Physical Activity: Not on file  Stress: Not on file  Social Connections: Not on file    Review of systems General: negative for malaise, night sweats, fever, chills, weight loss Neck: Negative for lumps, goiter, pain and significant neck swelling Resp: Negative for cough, wheezing, dyspnea at rest CV: Negative for chest pain, leg swelling, palpitations, orthopnea GI: denies melena,  nausea, vomiting, diarrhea, constipation, dysphagia, odyonophagia, early satiety or unintentional weight loss.  + Rectal bleeding + diarrhea + rectal irritation MSK: Negative for joint pain or swelling, back pain, and muscle pain. Derm: Negative for itching or rash Psych: Denies depression, anxiety, memory loss, confusion. No homicidal or suicidal ideation.  Heme: Negative for prolonged bleeding, bruising easily, and swollen nodes. Endocrine: Negative for cold or heat intolerance, polyuria, polydipsia and goiter. Neuro: negative for tremor, gait imbalance, syncope and seizures. The remainder of the review of systems is noncontributory.  Physical Exam: BP 126/72   Pulse 84   Temp 98 F (36.7 C) (Oral)   Ht 5' (1.524 m)   Wt 147 lb 4.8 oz (66.8 kg)   BMI 28.77 kg/m   General:   Alert and oriented. No distress noted. Pleasant and cooperative.  Head:  Normocephalic and atraumatic. Eyes:  Conjuctiva clear without scleral icterus. Mouth:  Oral mucosa pink and moist. Good dentition. No lesions. Heart: Normal rate and rhythm, s1 and s2 heart sounds present.  Lungs: Clear lung sounds in all lobes. Respirations equal and unlabored. Abdomen:  +BS, soft, non-tender and non-distended. No rebound or guarding. No HSM or masses noted. Derm: No palmar erythema or jaundice Msk:  Symmetrical without gross deformities. Normal posture. Extremities:  Without edema. Neurologic:  Alert and  oriented x4 Psych:  Alert and cooperative. Normal mood and affect.  Invalid input(s): "6 MONTHS"   ASSESSMENT: Rebekah Gutierrez is a 78 y.o. female presenting today for follow up of diarrhea, rectal bleeding, and candida esophagitis   Diarrhea: Diarrhea and loose stools have been intermittent over the past few years.  Anorectal manometry previously revealed anorectal dyssynergia for which she has been seeing pelvic floor physical therapy for she has been instructed to restart Benefiber previously as in the past this gave her more solid stools though she has not done this.  Would recommend she resume Benefiber 1 tablespoon 1-2 times per day with a meal, continue good water intake and can use dicyclomine twice daily as needed.  She should continue with pelvic floor physical therapist exercise recommendations.  Rectal bleeding/anemia: Patient has had intermittent toilet tissue hematochezia, that has been occurring since a few months after her last colonoscopy in 2022 though and notes rectal bleeding has become somewhat heavier.  He does endorse some rectal irritation/burning, has been treated for hemorrhoids in the past as rectal exam previously revealed hemorrhoids though does not seem she has had much improvement with topicals for hemorrhoid treatment.  She is also had some mild anemia on recent  CBC in December and again in January.  Given worsening rectal bleeding and new onset of anemia, would recommend proceeding with updating colonoscopy for further evaluation of her symptoms.  For now she should avoid using hemorrhoid cream as overuse of this can thin the skin in that area which I discussed with the patient, would recommend she use A&E or Desitin ointment for rectal irritation and keep area clean and dry.  Indications, risks and benefits of procedure discussed in detail with patient. Patient verbalized understanding and is in agreement to proceed with colonoscopy  Candida esophagitis: Patient previously having some melena, denies any dysphagia or odynophagia.  She did complete full course of fluconazole after EGD.  No recommendation for repeat EGD as her symptoms have resolved.   PLAN:  -Schedule colonoscopy ASA III  -Continue Bentyl 10 mg twice daily as needed - start benefiber 1T BID - avoid overuse of hemorrhoid cream, can use A&D ointment on rectal area, keep area clean, dry, avoid harsh soaps and wet wipes  -Continue with pelvic floor physical therapist recommendations  All questions were answered, patient verbalized understanding and is in agreement with plan as outlined above.    Follow Up: 3 months   Dera Vanaken L. Jeanmarie Hubert, MSN, APRN, AGNP-C Adult-Gerontology Nurse Practitioner [facility]  I have reviewed the note and agree with the APP's assessment as described in this progress note  Katrinka Blazing, MD Gastroenterology and Hepatology [facility] Gastroenterology

## 2023-11-08 NOTE — Patient Instructions (Signed)
-  we will get you Scheduled for colonoscopy -Continue Bentyl 10 mg twice daily as needed for abdominal pain and looser stools - start benefiber 1 table stools twice daily with a meal  - avoid hemorrhoid cream for now, can use A&D or desitin ointment on rectal area, keep area clean, dry, avoid harsh soaps and wet wipes  Follow up 3 months  It was a pleasure to see you today. I want to create trusting relationships with patients and provide genuine, compassionate, and quality care. I truly value your feedback! please be on the lookout for a survey regarding your visit with me today. I appreciate your input about our visit and your time in completing this!    Rebekah Peale L. Jeanmarie Hubert, MSN, APRN, AGNP-C Adult-Gerontology Nurse Practitioner [facility] Gastroenterology at [facility]

## 2023-11-12 ENCOUNTER — Other Ambulatory Visit (INDEPENDENT_AMBULATORY_CARE_PROVIDER_SITE_OTHER): Payer: Self-pay | Admitting: Gastroenterology

## 2023-11-15 ENCOUNTER — Other Ambulatory Visit (INDEPENDENT_AMBULATORY_CARE_PROVIDER_SITE_OTHER): Payer: Self-pay | Admitting: Gastroenterology

## 2023-11-19 ENCOUNTER — Telehealth (INDEPENDENT_AMBULATORY_CARE_PROVIDER_SITE_OTHER): Payer: Self-pay | Admitting: Gastroenterology

## 2023-11-19 MED ORDER — PEG 3350-KCL-NA BICARB-NACL 420 G PO SOLR: 4000.0000 mL | Freq: Once | ORAL | 0 refills | Status: AC

## 2023-11-19 NOTE — Telephone Encounter (Signed)
 Left message for pt to return call to schedule TCS

## 2023-11-19 NOTE — Telephone Encounter (Signed)
 Pt returned call and verbalized that she will have transportation. Will mail instructions. Prep sent to pharmacy.

## 2023-11-19 NOTE — Telephone Encounter (Signed)
 Pt returned call. Pt tentatively scheduled for 12/11/23 but will need to call back once she confirms transportation.

## 2023-11-19 NOTE — Addendum Note (Signed)
 Addended by: Marlowe Shores on: 11/19/2023 02:16 PM   Modules accepted: Orders

## 2023-12-05 NOTE — Patient Instructions (Addendum)
 Rebekah Gutierrez  12/05/2023     @PREFPERIOPPHARMACY @   Your procedure is scheduled on 12/11/2023.   Report to [facility] at 11:30 A.M.   Call this number if you have problems the morning of surgery:   [phone]  If you experience any cold or flu symptoms such as cough, fever, chills, shortness of breath, etc. between now and your scheduled surgery, please notify us at the above number.   Remember:   Please Follow the diet and prep instructions given to you by Dr Wilburt Finlay office.   You may drink clear liquids until 9:30 AM the day of the procedure .  Clear liquids allowed are:                    Water, Juice (No red color; non-citric and without pulp; diabetics please choose diet or no sugar options), Carbonated beverages (diabetics please choose diet or no sugar options), Clear Tea (No creamer, milk, or cream, including half & half and powdered creamer), and Clear Sports drink (No red color; diabetics please choose diet or no sugar options)    Take these medicines the morning of surgery with A SIP OF WATER : Norvasc and Neurontin   Do not take any diabetic medications the am of the procedure.    Last dose of Farxiga should bo on 12/07/23.   Please use your Albuterol Inhaler before coming to the hospital.    Do not wear jewelry, make-up or nail polish, including gel polish,  artificial nails, or any other type of covering on natural nails (fingers and  toes).  Do not wear lotions, powders, or perfumes, or deodorant.  Do not shave 48 hours prior to surgery.  Men may shave face and neck.  Do not bring valuables to the hospital.  [facility] is not responsible for any belongings or valuables.  Contacts, dentures or bridgework may not be worn into surgery.  Leave your suitcase in the car.  After surgery it may be brought to your room.  For patients admitted to the hospital, discharge time will be determined by your treatment team.  Patients discharged the day of surgery will  not be allowed to drive home.   Name and phone number of your driver:   Family  Special instructions:  N/A  Please read over the following fact sheets that you were given.  Care and Recovery After Surgery  Colonoscopy, Adult A colonoscopy is a procedure to look at the entire large intestine. This procedure is done using a long, thin, flexible tube that has a camera on the end. You may have a colonoscopy: As a part of normal colorectal screening. If you have certain symptoms, such as: A low number of red blood cells in your blood (anemia). Diarrhea that does not go away. Pain in your abdomen. Blood in your stool. A colonoscopy can help screen for and diagnose medical problems, including: An abnormal growth of cells or tissue (tumor). Abnormal growths within the lining of your intestine (polyps). Inflammation. Areas of bleeding. Tell your health care provider about: Any allergies you have. All medicines you are taking, including vitamins, herbs, eye drops, creams, and over-the-counter medicines. Any problems you or family members have had with anesthetic medicines. Any bleeding problems you have. Any surgeries you have had. Any medical conditions you have. Any problems you have had with having bowel movements. Whether you are pregnant or may be pregnant. What are the risks? Generally, this is a safe procedure.  However, problems may occur, including: Bleeding. Damage to your intestine. Allergic reactions to medicines given during the procedure. Infection. This is rare. What happens before the procedure? Eating and drinking restrictions Follow instructions from your health care provider about eating or drinking restrictions, which may include: A few days before the procedure: Follow a low-fiber diet. Avoid nuts, seeds, dried fruit, raw fruits, and vegetables. 1-3 days before the procedure: Eat only gelatin dessert or ice pops. Drink only clear liquids, such as water, clear  juice, clear broth or bouillon, black coffee or tea, or clear soft drinks or sports drinks. Avoid liquids that contain red or purple dye. The day of the procedure: Do not eat solid foods. You may continue to drink clear liquids until up to 2 hours before the procedure. Do not eat or drink anything starting 2 hours before the procedure, or within the time period that your health care provider recommends. Bowel prep If you were prescribed a bowel prep to take by mouth (orally) to clean out your colon: Take it as told by your health care provider. Starting the day before your procedure, you will need to drink a large amount of liquid medicine. The liquid will cause you to have many bowel movements of loose stool until your stool becomes almost clear or light green. If your skin or the opening between the buttocks (anus) gets irritated from diarrhea, you may relieve the irritation using: Wipes with medicine in them, such as adult wet wipes with aloe and vitamin E. A product to soothe skin, such as petroleum jelly. If you vomit while drinking the bowel prep: Take a break for up to 60 minutes. Begin the bowel prep again. Call your health care provider if you keep vomiting or you cannot take the bowel prep without vomiting. To clean out your colon, you may also be given: Laxative medicines. These help you have a bowel movement. Instructions for enema use. An enema is liquid medicine injected into your rectum. Medicines Ask your health care provider about: Changing or stopping your regular medicines or supplements. This is especially important if you are taking iron supplements, diabetes medicines, or blood thinners. Taking medicines such as aspirin and ibuprofen. These medicines can thin your blood. Do not take these medicines unless your health care provider tells you to take them. Taking over-the-counter medicines, vitamins, herbs, and supplements. General instructions Ask your health care  provider what steps will be taken to help prevent infection. These may include washing skin with a germ-killing soap. If you will be going home right after the procedure, plan to have a responsible adult: Take you home from the hospital or clinic. You will not be allowed to drive. Care for you for the time you are told. What happens during the procedure?  An IV will be inserted into one of your veins. You will be given a medicine to make you fall asleep (general anesthetic). You will lie on your side with your knees bent. A lubricant will be put on the tube. Then the tube will be: Inserted into your anus. Gently eased through all parts of your large intestine. Air will be sent into your colon to keep it open. This may cause some pressure or cramping. Images will be taken with the camera and will appear on a screen. A small tissue sample may be removed to be looked at under a microscope (biopsy). The tissue may be sent to a lab for testing if any signs of problems are found. If  small polyps are found, they may be removed and checked for cancer cells. When the procedure is finished, the tube will be removed. The procedure may vary among health care providers and hospitals. What happens after the procedure? Your blood pressure, heart rate, breathing rate, and blood oxygen level will be monitored until you leave the hospital or clinic. You may have a small amount of blood in your stool. You may pass gas and have mild cramping or bloating in your abdomen. This is caused by the air that was used to open your colon during the exam. If you were given a sedative during the procedure, it can affect you for several hours. Do not drive or operate machinery until your health care provider says that it is safe. It is up to you to get the results of your procedure. Ask your health care provider, or the department that is doing the procedure, when your results will be ready. Summary A colonoscopy is a  procedure to look at the entire large intestine. Follow instructions from your health care provider about eating and drinking before the procedure. If you were prescribed an oral bowel prep to clean out your colon, take it as told by your health care provider. During the colonoscopy, a flexible tube with a camera on its end is inserted into the anus and then passed into all parts of the large intestine. This information is not intended to replace advice given to you by your health care provider. Make sure you discuss any questions you have with your health care provider. Document Revised: 10/10/2022 Document Reviewed: 04/20/2021 Elsevier Patient Education  2024 [employer]  Monitored Anesthesia Care Anesthesia refers to the techniques, procedures, and medicines that help a person stay safe and comfortable during surgery. Monitored anesthesia care, or sedation, is one type of anesthesia. You may have sedation if you do not need to be asleep for your procedure. Procedures that use sedation may include: Surgery to remove cataracts from your eyes. A dental procedure. A biopsy. This is when a tissue sample is removed and looked at under a microscope. You will be watched closely during your procedure. Your level of sedation or type of anesthesia may be changed to fit your needs. Tell a health care provider about: Any allergies you have. All medicines you are taking, including vitamins, herbs, eye drops, creams, and over-the-counter medicines. Any problems you or family members have had with anesthesia. Any bleeding problems you have. Any surgeries you have had. Any medical conditions or illnesses you have. This includes sleep apnea, cough, fever, or the flu. Whether you are pregnant or may be pregnant. Whether you use cigarettes, alcohol, or drugs. Any use of steroids, whether by mouth or as a cream. What are the risks? Your health care provider will talk with you about risks. These may  include: Getting too much medicine (oversedation). Nausea. Allergic reactions to medicines. Trouble breathing. If this happens, a breathing tube may be used to help you breathe. It will be removed when you are awake and breathing on your own. Heart trouble. Lung trouble. Confusion that gets better with time (emergence delirium). What happens before the procedure? When to stop eating and drinking Follow instructions from your health care provider about what you may eat and drink. These may include: 8 hours before your procedure Stop eating most foods. Do not eat meat, fried foods, or fatty foods. Eat only light foods, such as toast or crackers. All liquids are okay except energy drinks and  alcohol. 6 hours before your procedure Stop eating. Drink only clear liquids, such as water, clear fruit juice, black coffee, plain tea, and sports drinks. Do not drink energy drinks or alcohol. 2 hours before your procedure Stop drinking all liquids. You may be allowed to take medicines with small sips of water. If you do not follow your health care provider's instructions, your procedure may be delayed or canceled. Medicines Ask your health care provider about: Changing or stopping your regular medicines. These include any diabetes medicines or blood thinners you take. Taking medicines such as aspirin and ibuprofen. These medicines can thin your blood. Do not take them unless your health care provider tells you to. Taking over-the-counter medicines, vitamins, herbs, and supplements. Testing You may have an exam or testing. You may have a blood or urine sample taken. General instructions Do not use any products that contain nicotine or tobacco for at least 4 weeks before the procedure. These products include cigarettes, chewing tobacco, and vaping devices, such as e-cigarettes. If you need help quitting, ask your health care provider. If you will be going home right after the procedure, plan to  have a responsible adult: Take you home from the hospital or clinic. You will not be allowed to drive. Care for you for the time you are told. What happens during the procedure?  Your blood pressure, heart rate, breathing, level of pain, and blood oxygen level will be monitored. An IV will be inserted into one of your veins. You may be given: A sedative. This helps you relax. Anesthesia. This will: Numb certain areas of your body. Make you fall asleep for surgery. You will be given medicines as needed to keep you comfortable. The more medicine you are given, the deeper your level of sedation will be. Your level of sedation may be changed to fit your needs. There are three levels of sedation: Mild sedation. At this level, you may feel awake and relaxed. You will be able to follow directions. Moderate sedation. At this level, you will be sleepy. You may not remember the procedure. Deep sedation. At this level, you will be asleep. You will not remember the procedure. How you get the medicines will depend on your age and the procedure. They may be given as: A pill. This may be taken by mouth (orally) or inserted into the rectum. An injection. This may be into a vein or muscle. A spray through the nose. After your procedure is over, the medicine will be stopped. The procedure may vary among health care providers and hospitals. What happens after the procedure? Your blood pressure, heart rate, breathing rate, and blood oxygen level will be monitored until you leave the hospital or clinic. You may feel sleepy, clumsy, or nauseous. You may not remember what happened during or after the procedure. Sedation can affect you for several hours. Do not drive or use machinery until your health care provider says that it is safe. This information is not intended to replace advice given to you by your health care provider. Make sure you discuss any questions you have with your health care  provider. Document Revised: 01/23/2022 Document Reviewed: 01/23/2022 Elsevier Patient Education  2024 [employer].

## 2023-12-07 ENCOUNTER — Encounter (HOSPITAL_COMMUNITY)
Admission: RE | Admit: 2023-12-07 | Discharge: 2023-12-07 | Disposition: A | Discharge status: Home / Self Care | Source: Ambulatory Visit | Attending: Gastroenterology | Admitting: Gastroenterology

## 2023-12-07 ENCOUNTER — Encounter (HOSPITAL_COMMUNITY): Payer: Self-pay

## 2023-12-07 HISTORY — DX: Pure hypercholesterolemia, unspecified: E78.00

## 2023-12-07 HISTORY — DX: Chronic obstructive pulmonary disease, unspecified: J44.9

## 2023-12-11 ENCOUNTER — Ambulatory Visit (HOSPITAL_BASED_OUTPATIENT_CLINIC_OR_DEPARTMENT_OTHER): Admitting: Anesthesiology

## 2023-12-11 ENCOUNTER — Ambulatory Visit (HOSPITAL_COMMUNITY)
Admission: RE | Admit: 2023-12-11 | Discharge: 2023-12-11 | Disposition: A | Discharge status: Home / Self Care | Attending: Gastroenterology | Admitting: Gastroenterology

## 2023-12-11 ENCOUNTER — Ambulatory Visit (HOSPITAL_COMMUNITY): Admitting: Anesthesiology

## 2023-12-11 ENCOUNTER — Encounter (HOSPITAL_COMMUNITY): Payer: Self-pay | Admitting: Gastroenterology

## 2023-12-11 ENCOUNTER — Encounter (HOSPITAL_COMMUNITY)
Admission: RE | Disposition: A | Discharge status: Home / Self Care | Payer: Self-pay | Source: Home / Self Care | Attending: Gastroenterology

## 2023-12-11 DIAGNOSIS — K625 Hemorrhage of anus and rectum: Secondary | ICD-10-CM

## 2023-12-11 DIAGNOSIS — K5730 Diverticulosis of large intestine without perforation or abscess without bleeding: Secondary | ICD-10-CM

## 2023-12-11 DIAGNOSIS — F17210 Nicotine dependence, cigarettes, uncomplicated: Secondary | ICD-10-CM | POA: Insufficient documentation

## 2023-12-11 DIAGNOSIS — K219 Gastro-esophageal reflux disease without esophagitis: Secondary | ICD-10-CM | POA: Insufficient documentation

## 2023-12-11 DIAGNOSIS — E119 Type 2 diabetes mellitus without complications: Secondary | ICD-10-CM | POA: Diagnosis not present

## 2023-12-11 DIAGNOSIS — J449 Chronic obstructive pulmonary disease, unspecified: Secondary | ICD-10-CM | POA: Insufficient documentation

## 2023-12-11 DIAGNOSIS — K649 Unspecified hemorrhoids: Secondary | ICD-10-CM

## 2023-12-11 DIAGNOSIS — K644 Residual hemorrhoidal skin tags: Secondary | ICD-10-CM | POA: Insufficient documentation

## 2023-12-11 DIAGNOSIS — I10 Essential (primary) hypertension: Secondary | ICD-10-CM | POA: Diagnosis not present

## 2023-12-11 DIAGNOSIS — Z7984 Long term (current) use of oral hypoglycemic drugs: Secondary | ICD-10-CM | POA: Insufficient documentation

## 2023-12-11 DIAGNOSIS — E1165 Type 2 diabetes mellitus with hyperglycemia: Secondary | ICD-10-CM

## 2023-12-11 DIAGNOSIS — N3281 Overactive bladder: Secondary | ICD-10-CM

## 2023-12-11 HISTORY — PX: COLONOSCOPY: SHX5424

## 2023-12-11 LAB — GLUCOSE, CAPILLARY: Glucose-Capillary: 103 mg/dL — ABNORMAL HIGH (ref 70–99)

## 2023-12-11 LAB — HM COLONOSCOPY

## 2023-12-11 SURGERY — COLONOSCOPY
Anesthesia: General

## 2023-12-11 MED ORDER — STERILE WATER FOR IRRIGATION IR SOLN
Status: DC | PRN
  Administered 2023-12-11: 120 mL

## 2023-12-11 MED ORDER — LACTATED RINGERS IV SOLN: INTRAVENOUS | Status: DC | PRN

## 2023-12-11 MED ORDER — PROPOFOL 500 MG/50ML IV EMUL
INTRAVENOUS | Status: DC | PRN
  Administered 2023-12-11: 150 ug/kg/min via INTRAVENOUS

## 2023-12-11 MED ORDER — SODIUM CHLORIDE 0.9% FLUSH: 3.0000 mL | Freq: Two times a day (BID) | INTRAVENOUS | Status: DC

## 2023-12-11 MED ORDER — PROPOFOL 10 MG/ML IV BOLUS
INTRAVENOUS | Status: DC | PRN
  Administered 2023-12-11: 100 mg via INTRAVENOUS

## 2023-12-11 MED ORDER — LIDOCAINE HCL (PF) 2 % IJ SOLN
INTRAMUSCULAR | Status: DC | PRN
  Administered 2023-12-11: 50 mg via INTRADERMAL

## 2023-12-11 MED ORDER — PHENYLEPHRINE 80 MCG/ML (10ML) SYRINGE FOR IV PUSH (FOR BLOOD PRESSURE SUPPORT)
PREFILLED_SYRINGE | INTRAVENOUS | Status: AC
  Filled 2023-12-11: qty 10

## 2023-12-11 MED ORDER — PHENYLEPHRINE 80 MCG/ML (10ML) SYRINGE FOR IV PUSH (FOR BLOOD PRESSURE SUPPORT)
PREFILLED_SYRINGE | INTRAVENOUS | Status: DC | PRN
  Administered 2023-12-11: 160 ug via INTRAVENOUS
  Administered 2023-12-11: 80 ug via INTRAVENOUS
  Administered 2023-12-11 (×2): 160 ug via INTRAVENOUS

## 2023-12-11 MED ORDER — SODIUM CHLORIDE 0.9% FLUSH: 3.0000 mL | INTRAVENOUS | Status: DC | PRN

## 2023-12-11 NOTE — Anesthesia Postprocedure Evaluation (Signed)
 Anesthesia Post Note  Patient: Rebekah Gutierrez. Fross  Procedure(s) Performed: COLONOSCOPY  Patient location during evaluation: PACU Anesthesia Type: General Level of consciousness: awake and alert Pain management: pain level controlled Vital Signs Assessment: post-procedure vital signs reviewed and stable Respiratory status: spontaneous breathing, nonlabored ventilation, respiratory function stable and patient connected to nasal cannula oxygen Cardiovascular status: blood pressure returned to baseline and stable Postop Assessment: no apparent nausea or vomiting Anesthetic complications: no   There were no known notable events for this encounter.   Last Vitals:  Vitals:   12/11/23 1308 12/11/23 1327  BP: (!) 95/43 105/65  Pulse: 72   Resp: 14   Temp:    SpO2: 98%     Last Pain:  Vitals:   12/11/23 1308  TempSrc:   PainSc: 0-No pain                 Lea Baine L Courteney Alderete

## 2023-12-11 NOTE — Discharge Instructions (Signed)
You are being discharged to home.  Resume your previous diet.  Your physician has recommended a repeat colonoscopy in five years for surveillance.  

## 2023-12-11 NOTE — Anesthesia Procedure Notes (Signed)
 Date/Time: 12/11/2023 12:46 PM  Performed by: Julian Reil, CRNAPre-anesthesia Checklist: Patient identified, Emergency Drugs available, Suction available and Patient being monitored Patient Re-evaluated:Patient Re-evaluated prior to induction Oxygen Delivery Method: Simple face mask Induction Type: IV induction Placement Confirmation: positive ETCO2

## 2023-12-11 NOTE — H&P (Signed)
 Rebekah Gutierrez is an 78 y.o. female.   Chief Complaint: rectal bleeding and diarrhea HPI: Rebekah Gutierrez is a 78 y.o. female with past medical history of DM, GERD, hiatal hernia, HTN, vertigo, anorectal dyssynergia, coming for evaluation of rectal bleeding and diarrhea.  Reports having intermittent episodes of rectal bleeding, having 2-3 episodes of diarrhea in the morning.  No nausea, vomiting, fever or chills.  Past Medical History:  Diagnosis Date   COPD (chronic obstructive pulmonary disease) (HCC)    Diabetes (HCC)    Encounter for screening colonoscopy 06/05/2012   GERD (gastroesophageal reflux disease)    Hiatal hernia    Hypercholesterolemia    Hypertension    Vertigo     Past Surgical History:  Procedure Laterality Date   BACK SURGERY     L4-5 PLIF   BIOPSY  09/06/2023   Procedure: BIOPSY;  Surgeon: Dolores Frame, MD;  Location: [facility];  Service: Gastroenterology;;   BLADDER SURGERY     CERVICAL SPINE SURGERY     fusion   CHOLECYSTECTOMY, LAPAROSCOPIC     Remote Past   COLONOSCOPY  06/20/2012   Procedure: COLONOSCOPY;  Surgeon: Malissa Hippo, MD;  Location: [facility];  Service: Endoscopy;  Laterality: N/A;  250   COLONOSCOPY WITH PROPOFOL N/A 07/26/2021   Procedure: COLONOSCOPY WITH PROPOFOL;  Surgeon: Dolores Frame, MD;  Location: [facility];  Service: Gastroenterology;  Laterality: N/A;  9:25   ESOPHAGOGASTRODUODENOSCOPY (EGD) WITH PROPOFOL N/A 09/06/2023   Procedure: ESOPHAGOGASTRODUODENOSCOPY (EGD) WITH PROPOFOL;  Surgeon: Dolores Frame, MD;  Location: [facility];  Service: Gastroenterology;  Laterality: N/A;  11:30AM;ASA 3   FOOT SURGERY     left    HAND SURGERY     left   INCISIONAL HERNIA REPAIR N/A 05/26/2020   Procedure: Mena Pauls WITH MESH;  Surgeon: Franky Macho, MD;  Location: [facility];  Service: General;  Laterality: N/A;   POLYPECTOMY  07/26/2021   Procedure: POLYPECTOMY;  Surgeon:  Dolores Frame, MD;  Location: [facility];  Service: Gastroenterology;;   TUBAL LIGATION      Family History  Problem Relation Age of Onset   Hypertension Mother    Stroke Mother    Hypertension Father    Cancer Father    Colon cancer Neg Hx    Social History:  reports that she has been smoking cigarettes. She has a 22.5 pack-year smoking history. She has been exposed to tobacco smoke. She has never used smokeless tobacco. She reports that she does not drink alcohol and does not use drugs.  Allergies: No Known Allergies  Medications Prior to Admission  Medication Sig Dispense Refill   acetaminophen (TYLENOL) 500 MG tablet Take 1,000 mg by mouth every 6 (six) hours as needed for moderate pain.     albuterol (VENTOLIN HFA) 108 (90 Base) MCG/ACT inhaler Inhale 2 puffs into the lungs every 6 (six) hours as needed for wheezing or shortness of breath. 8 g 0   ALPHAGAN P 0.1 % SOLN Place 1 drop into both eyes 2 (two) times daily.     amLODipine (NORVASC) 10 MG tablet Take 1 tablet (10 mg total) by mouth daily. 90 tablet 3   Apoaequorin (PREVAGEN PO) Take 1 tablet by mouth daily.     aspirin 325 MG EC tablet Take 325 mg by mouth daily.     atorvastatin (LIPITOR) 20 MG tablet Take 0.5 tablets (10 mg total) by mouth daily. 90 tablet 3  Calcium Carb-Cholecalciferol (CALCIUM 1000 + D PO) Take 1 tablet by mouth daily.     Cinnamon 500 MG TABS Take 500 mg by mouth at bedtime.      dicyclomine (BENTYL) 10 MG capsule TAKE 1 CAPSULE BY MOUTH TWICE DAILY AS NEEDED FOR SPASMS 60 capsule 0   dorzolamide-timolol (COSOPT) 22.3-6.8 MG/ML ophthalmic solution Place 1 drop into both eyes 2 (two) times daily.     gabapentin (NEURONTIN) 300 MG capsule TAKE 1 CAPSULE BY MOUTH ONCE DAILY AT BEDTIME FOR BACK PAIN 90 capsule 3   glipiZIDE (GLUCOTROL XL) 10 MG 24 hr tablet Take 1 tablet (10 mg total) by mouth daily with breakfast. 90 tablet 3   glucose blood (ONETOUCH ULTRA) test strip USE 1 STRIP TO  CHECK GLUCOSE TWICE DAILY AS DIRECTED 100 each 3   hydrochlorothiazide (HYDRODIURIL) 12.5 MG tablet Take 12.5 mg by mouth daily.     hydrocortisone (ANUSOL-HC) 2.5 % rectal cream Place 1 Application rectally 3 (three) times daily. 56 g 1   Lancets (ONETOUCH DELICA PLUS LANCET33G) MISC USE 1 LANCET TO CHECK GLUCOSE TWICE DAILY AS DIRECTED 100 each 3   lisinopril (ZESTRIL) 40 MG tablet Take 1 tablet (40 mg total) by mouth daily. 90 tablet 3   Methylcellulose, Laxative, (FIBER THERAPY) 500 MG TABS Take 500 mg by mouth daily.     Multiple Vitamin (MULTIVITAMIN WITH MINERALS) TABS tablet Take 1 tablet by mouth daily.     mycophenolate (CELLCEPT) 500 MG tablet Take 1,000 mg by mouth 2 (two) times daily.     Potassium 99 MG TABS Take 99 mg by mouth daily.     VYZULTA 0.024 % SOLN Place 1 drop into both eyes at bedtime.      FARXIGA 10 MG TABS tablet Take 10 mg by mouth daily.     Omega-3 Fatty Acids (FISH OIL) 1000 MG CAPS tAKE 1000MG  TWICE A DAY FOR CHOLESTEROL (Patient taking differently: Take 1,000 mg by mouth in the morning and at bedtime.)  0    No results found for this or any previous visit (from the past 48 hours). No results found.  Review of Systems  Gastrointestinal:  Positive for blood in stool.  All other systems reviewed and are negative.   Blood pressure 113/75, pulse 71, temperature 98.3 F (36.8 C), temperature source Oral, resp. rate 16, height 4\' 10"  (1.473 m), weight 66.7 kg, SpO2 98%. Physical Exam  GENERAL: The patient is AO x3, in no acute distress. HEENT: Head is normocephalic and atraumatic. EOMI are intact. Mouth is well hydrated and without lesions. NECK: Supple. No masses LUNGS: Clear to auscultation. No presence of rhonchi/wheezing/rales. Adequate chest expansion HEART: RRR, normal s1 and s2. ABDOMEN: Soft, nontender, no guarding, no peritoneal signs, and nondistended. BS +. No masses. EXTREMITIES: Without any cyanosis, clubbing, rash, lesions or  edema. NEUROLOGIC: AOx3, no focal motor deficit. SKIN: no jaundice, no rashes  Assessment/Plan Rebekah Gutierrez is a 78 y.o. female with past medical history of DM, GERD, hiatal hernia, HTN, vertigo, anorectal dyssynergia, coming for evaluation of rectal bleeding and diarrhea.  Will proceed with colonoscopy.  Dolores Frame, MD 12/11/2023, 12:37 PM

## 2023-12-11 NOTE — Op Note (Signed)
 [facility] Patient Name: Rebekah Gutierrez Procedure Date: 12/11/2023 11:37 AM MRN: [id number] Date of Birth: July 22, 1946 Attending MD: Katrinka Blazing , , [id number] CSN: [id number] Age: 78 Admit Type: Outpatient Procedure:                Colonoscopy Indications:              Rectal bleeding Providers:                Katrinka Blazing, Angelica Ran, Elinor Parkinson Referring MD:              Medicines:                Monitored Anesthesia Care Complications:            No immediate complications. Estimated Blood Loss:     Estimated blood loss: none. Procedure:                Pre-Anesthesia Assessment:                           - Prior to the procedure, a History and Physical                            was performed, and patient medications, allergies                            and sensitivities were reviewed. The patient's                            tolerance of previous anesthesia was reviewed.                           - The risks and benefits of the procedure and the                            sedation options and risks were discussed with the                            patient. All questions were answered and informed                            consent was obtained.                           - ASA Grade Assessment: III - A patient with severe                            systemic disease.                           After obtaining informed consent, the colonoscope                            was passed under direct vision. Throughout the                            procedure, the patient's blood pressure, pulse, and  oxygen saturations were monitored continuously. The                            [id number]-HQ190L ([id number]) scope was introduced through                            the anus and advanced to the the terminal ileum.                            The colonoscopy was performed without difficulty.                            The patient tolerated the procedure well.  The                            quality of the bowel preparation was good. Scope In: 12:46:58 PM Scope Out: 1:04:15 PM Scope Withdrawal Time: 0 hours 11 minutes 34 seconds  Total Procedure Duration: 0 hours 17 minutes 17 seconds  Findings:      Hemorrhoids were found on perianal exam.      The terminal ileum appeared normal.      Scattered small-mouthed diverticula were found in the sigmoid colon,       descending colon and ascending colon.      The retroflexed view of the distal rectum and anal verge was normal and       showed no anal or rectal abnormalities. Impression:               - Hemorrhoids found on perianal exam.                           - The examined portion of the ileum was normal.                           - Diverticulosis in the sigmoid colon, in the                            descending colon and in the ascending colon.                           - The distal rectum and anal verge are normal on                            retroflexion view.                           - No specimens collected.                           Bleeding related to intermittent external                            hemorrhoid bleeding. Moderate Sedation:      Per Anesthesia Care Recommendation:           - Discharge patient to home (ambulatory).                           -  Resume previous diet.                           - Repeat colonoscopy in 5 years for surveillance. Procedure Code(s):        --- Professional ---                           [id number], Colonoscopy, flexible; diagnostic, including                            collection of specimen(s) by brushing or washing,                            when performed (separate procedure) Diagnosis Code(s):        --- Professional ---                           K64.9, Unspecified hemorrhoids                           K62.5, Hemorrhage of anus and rectum                           K57.30, Diverticulosis of large intestine without                             perforation or abscess without bleeding CPT copyright 2022 [employer]. All rights reserved. The codes documented in this report are preliminary and upon coder review may  be revised to meet current compliance requirements. Katrinka Blazing, MD Katrinka Blazing,  12/11/2023 1:12:52 PM This report has been signed electronically. Number of Addenda: 0

## 2023-12-11 NOTE — Anesthesia Preprocedure Evaluation (Addendum)
 Anesthesia Evaluation  Patient identified by MRN, date of birth, ID band Patient awake    Reviewed: Allergy & Precautions, H&P , NPO status , Patient's Chart, lab work & pertinent test results, reviewed documented beta blocker date and time   Airway Mallampati: II  TM Distance: >3 FB Neck ROM: full    Dental  (+) Upper Dentures, Lower Dentures   Pulmonary COPD, Current Smoker and Patient abstained from smoking.   Pulmonary exam normal breath sounds clear to auscultation       Cardiovascular Exercise Tolerance: Good hypertension, Normal cardiovascular exam Rhythm:regular Rate:Normal     Neuro/Psych vertigo  negative psych ROS   GI/Hepatic Neg liver ROS, hiatal hernia,GERD  Medicated,,  Endo/Other  diabetes, Type 2    Renal/GU negative Renal ROS  negative genitourinary   Musculoskeletal   Abdominal   Peds  Hematology negative hematology ROS (+)   Anesthesia Other Findings   Reproductive/Obstetrics negative OB ROS                             Anesthesia Physical Anesthesia Plan  ASA: 3  Anesthesia Plan: General   Post-op Pain Management: Minimal or no pain anticipated   Induction:   PONV Risk Score and Plan: Propofol infusion  Airway Management Planned: Nasal Cannula and Natural Airway  Additional Equipment: None  Intra-op Plan:   Post-operative Plan:   Informed Consent: I have reviewed the patients History and Physical, chart, labs and discussed the procedure including the risks, benefits and alternatives for the proposed anesthesia with the patient or authorized representative who has indicated his/her understanding and acceptance.     Dental Advisory Given  Plan Discussed with: CRNA  Anesthesia Plan Comments:         Anesthesia Quick Evaluation

## 2023-12-11 NOTE — Transfer of Care (Signed)
 Immediate Anesthesia Transfer of Care Note  Patient: Keyondra Lagrand. [name]  Procedure(s) Performed: COLONOSCOPY  Patient Location: Short Stay  Anesthesia Type:General  Level of Consciousness: awake, alert , and oriented  Airway & Oxygen Therapy: Patient Spontanous Breathing  Post-op Assessment: Report given to RN and Post -op Vital signs reviewed and stable  Post vital signs: Reviewed and stable  Last Vitals:  Vitals Value Taken Time  BP 95/43 12/11/23 1307  Temp    Pulse 73 12/11/23 1309  Resp 19 12/11/23 1309  SpO2 98 % 12/11/23 1309  Vitals shown include unfiled device data.  Last Pain:  Vitals:   12/11/23 1242  TempSrc:   PainSc: 0-No pain         Complications: No notable events documented.

## 2023-12-12 ENCOUNTER — Encounter (INDEPENDENT_AMBULATORY_CARE_PROVIDER_SITE_OTHER): Payer: Self-pay | Admitting: *Deleted

## 2023-12-12 ENCOUNTER — Encounter (HOSPITAL_COMMUNITY): Payer: Self-pay | Admitting: Gastroenterology

## 2023-12-13 DIAGNOSIS — L110 Acquired keratosis follicularis: Secondary | ICD-10-CM | POA: Diagnosis not present

## 2023-12-13 DIAGNOSIS — M79672 Pain in left foot: Secondary | ICD-10-CM | POA: Diagnosis not present

## 2023-12-13 DIAGNOSIS — M79674 Pain in right toe(s): Secondary | ICD-10-CM | POA: Diagnosis not present

## 2023-12-13 DIAGNOSIS — M79675 Pain in left toe(s): Secondary | ICD-10-CM | POA: Diagnosis not present

## 2023-12-13 DIAGNOSIS — M79671 Pain in right foot: Secondary | ICD-10-CM | POA: Diagnosis not present

## 2023-12-13 DIAGNOSIS — E1140 Type 2 diabetes mellitus with diabetic neuropathy, unspecified: Secondary | ICD-10-CM | POA: Diagnosis not present

## 2023-12-13 DIAGNOSIS — I739 Peripheral vascular disease, unspecified: Secondary | ICD-10-CM | POA: Diagnosis not present

## 2023-12-27 DIAGNOSIS — I10 Essential (primary) hypertension: Secondary | ICD-10-CM | POA: Diagnosis not present

## 2023-12-27 DIAGNOSIS — E1165 Type 2 diabetes mellitus with hyperglycemia: Secondary | ICD-10-CM | POA: Diagnosis not present

## 2024-01-02 ENCOUNTER — Other Ambulatory Visit (HOSPITAL_COMMUNITY): Payer: Self-pay | Admitting: Family Medicine

## 2024-01-02 DIAGNOSIS — E785 Hyperlipidemia, unspecified: Secondary | ICD-10-CM | POA: Diagnosis not present

## 2024-01-02 DIAGNOSIS — E1122 Type 2 diabetes mellitus with diabetic chronic kidney disease: Secondary | ICD-10-CM | POA: Diagnosis not present

## 2024-01-02 DIAGNOSIS — N183 Chronic kidney disease, stage 3 unspecified: Secondary | ICD-10-CM | POA: Diagnosis not present

## 2024-01-02 DIAGNOSIS — G4700 Insomnia, unspecified: Secondary | ICD-10-CM | POA: Diagnosis not present

## 2024-01-02 DIAGNOSIS — E1165 Type 2 diabetes mellitus with hyperglycemia: Secondary | ICD-10-CM | POA: Diagnosis not present

## 2024-01-02 DIAGNOSIS — R809 Proteinuria, unspecified: Secondary | ICD-10-CM | POA: Diagnosis not present

## 2024-01-02 DIAGNOSIS — Z7984 Long term (current) use of oral hypoglycemic drugs: Secondary | ICD-10-CM | POA: Diagnosis not present

## 2024-01-02 DIAGNOSIS — I700 Atherosclerosis of aorta: Secondary | ICD-10-CM | POA: Diagnosis not present

## 2024-01-02 DIAGNOSIS — I129 Hypertensive chronic kidney disease with stage 1 through stage 4 chronic kidney disease, or unspecified chronic kidney disease: Secondary | ICD-10-CM | POA: Diagnosis not present

## 2024-01-02 DIAGNOSIS — I10 Essential (primary) hypertension: Secondary | ICD-10-CM | POA: Diagnosis not present

## 2024-01-02 DIAGNOSIS — Z1231 Encounter for screening mammogram for malignant neoplasm of breast: Secondary | ICD-10-CM

## 2024-01-02 DIAGNOSIS — E876 Hypokalemia: Secondary | ICD-10-CM | POA: Diagnosis not present

## 2024-01-02 DIAGNOSIS — E1140 Type 2 diabetes mellitus with diabetic neuropathy, unspecified: Secondary | ICD-10-CM | POA: Diagnosis not present

## 2024-01-08 ENCOUNTER — Other Ambulatory Visit (INDEPENDENT_AMBULATORY_CARE_PROVIDER_SITE_OTHER): Payer: Self-pay | Admitting: Gastroenterology

## 2024-01-14 ENCOUNTER — Ambulatory Visit (HOSPITAL_COMMUNITY)
Admission: RE | Admit: 2024-01-14 | Discharge: 2024-01-14 | Disposition: A | Discharge status: Home / Self Care | Source: Ambulatory Visit | Attending: Family Medicine | Admitting: Family Medicine

## 2024-01-14 DIAGNOSIS — Z1231 Encounter for screening mammogram for malignant neoplasm of breast: Secondary | ICD-10-CM | POA: Diagnosis not present

## 2024-01-15 ENCOUNTER — Other Ambulatory Visit (INDEPENDENT_AMBULATORY_CARE_PROVIDER_SITE_OTHER): Payer: Self-pay | Admitting: Gastroenterology

## 2024-01-29 DIAGNOSIS — Z961 Presence of intraocular lens: Secondary | ICD-10-CM | POA: Diagnosis not present

## 2024-01-29 DIAGNOSIS — H401113 Primary open-angle glaucoma, right eye, severe stage: Secondary | ICD-10-CM | POA: Diagnosis not present

## 2024-01-29 DIAGNOSIS — H401121 Primary open-angle glaucoma, left eye, mild stage: Secondary | ICD-10-CM | POA: Diagnosis not present

## 2024-01-29 DIAGNOSIS — H30033 Focal chorioretinal inflammation, peripheral, bilateral: Secondary | ICD-10-CM | POA: Diagnosis not present

## 2024-01-29 DIAGNOSIS — H3581 Retinal edema: Secondary | ICD-10-CM | POA: Diagnosis not present

## 2024-01-29 DIAGNOSIS — Z79899 Other long term (current) drug therapy: Secondary | ICD-10-CM | POA: Diagnosis not present

## 2024-02-20 DIAGNOSIS — H2013 Chronic iridocyclitis, bilateral: Secondary | ICD-10-CM | POA: Diagnosis not present

## 2024-02-20 DIAGNOSIS — Z961 Presence of intraocular lens: Secondary | ICD-10-CM | POA: Diagnosis not present

## 2024-02-20 DIAGNOSIS — H401113 Primary open-angle glaucoma, right eye, severe stage: Secondary | ICD-10-CM | POA: Diagnosis not present

## 2024-02-20 DIAGNOSIS — H40022 Open angle with borderline findings, high risk, left eye: Secondary | ICD-10-CM | POA: Diagnosis not present

## 2024-03-27 DIAGNOSIS — I739 Peripheral vascular disease, unspecified: Secondary | ICD-10-CM | POA: Diagnosis not present

## 2024-03-27 DIAGNOSIS — E1140 Type 2 diabetes mellitus with diabetic neuropathy, unspecified: Secondary | ICD-10-CM | POA: Diagnosis not present

## 2024-03-27 DIAGNOSIS — M79672 Pain in left foot: Secondary | ICD-10-CM | POA: Diagnosis not present

## 2024-03-27 DIAGNOSIS — M79675 Pain in left toe(s): Secondary | ICD-10-CM | POA: Diagnosis not present

## 2024-03-27 DIAGNOSIS — M79671 Pain in right foot: Secondary | ICD-10-CM | POA: Diagnosis not present

## 2024-03-27 DIAGNOSIS — L110 Acquired keratosis follicularis: Secondary | ICD-10-CM | POA: Diagnosis not present

## 2024-03-27 DIAGNOSIS — M79674 Pain in right toe(s): Secondary | ICD-10-CM | POA: Diagnosis not present

## 2024-04-14 ENCOUNTER — Telehealth: Payer: Self-pay

## 2024-04-14 NOTE — Telephone Encounter (Signed)
 Up to date on meds, next review in September

## 2024-04-25 DIAGNOSIS — I10 Essential (primary) hypertension: Secondary | ICD-10-CM | POA: Diagnosis not present

## 2024-04-25 DIAGNOSIS — E1165 Type 2 diabetes mellitus with hyperglycemia: Secondary | ICD-10-CM | POA: Diagnosis not present

## 2024-05-01 DIAGNOSIS — E1165 Type 2 diabetes mellitus with hyperglycemia: Secondary | ICD-10-CM | POA: Diagnosis not present

## 2024-05-01 DIAGNOSIS — G4700 Insomnia, unspecified: Secondary | ICD-10-CM | POA: Diagnosis not present

## 2024-05-01 DIAGNOSIS — N183 Chronic kidney disease, stage 3 unspecified: Secondary | ICD-10-CM | POA: Diagnosis not present

## 2024-05-01 DIAGNOSIS — E785 Hyperlipidemia, unspecified: Secondary | ICD-10-CM | POA: Diagnosis not present

## 2024-05-01 DIAGNOSIS — I700 Atherosclerosis of aorta: Secondary | ICD-10-CM | POA: Diagnosis not present

## 2024-05-01 DIAGNOSIS — E1140 Type 2 diabetes mellitus with diabetic neuropathy, unspecified: Secondary | ICD-10-CM | POA: Diagnosis not present

## 2024-05-01 DIAGNOSIS — I10 Essential (primary) hypertension: Secondary | ICD-10-CM | POA: Diagnosis not present

## 2024-05-01 DIAGNOSIS — E876 Hypokalemia: Secondary | ICD-10-CM | POA: Diagnosis not present

## 2024-05-01 DIAGNOSIS — R809 Proteinuria, unspecified: Secondary | ICD-10-CM | POA: Diagnosis not present

## 2024-05-01 DIAGNOSIS — F17210 Nicotine dependence, cigarettes, uncomplicated: Secondary | ICD-10-CM | POA: Diagnosis not present

## 2024-05-01 DIAGNOSIS — I129 Hypertensive chronic kidney disease with stage 1 through stage 4 chronic kidney disease, or unspecified chronic kidney disease: Secondary | ICD-10-CM | POA: Diagnosis not present

## 2024-05-01 DIAGNOSIS — Z79899 Other long term (current) drug therapy: Secondary | ICD-10-CM | POA: Diagnosis not present

## 2024-06-03 DIAGNOSIS — H401113 Primary open-angle glaucoma, right eye, severe stage: Secondary | ICD-10-CM | POA: Diagnosis not present

## 2024-06-03 DIAGNOSIS — Z79899 Other long term (current) drug therapy: Secondary | ICD-10-CM | POA: Diagnosis not present

## 2024-06-03 DIAGNOSIS — Z961 Presence of intraocular lens: Secondary | ICD-10-CM | POA: Diagnosis not present

## 2024-06-03 DIAGNOSIS — H30033 Focal chorioretinal inflammation, peripheral, bilateral: Secondary | ICD-10-CM | POA: Diagnosis not present

## 2024-06-03 DIAGNOSIS — H401121 Primary open-angle glaucoma, left eye, mild stage: Secondary | ICD-10-CM | POA: Diagnosis not present

## 2024-06-04 ENCOUNTER — Other Ambulatory Visit (HOSPITAL_COMMUNITY)
Admission: RE | Admit: 2024-06-04 | Discharge: 2024-06-04 | Disposition: A | Discharge status: Home / Self Care | Source: Ambulatory Visit | Attending: Ophthalmology | Admitting: Ophthalmology

## 2024-06-04 DIAGNOSIS — H30033 Focal chorioretinal inflammation, peripheral, bilateral: Secondary | ICD-10-CM | POA: Insufficient documentation

## 2024-06-04 LAB — CBC WITH DIFFERENTIAL/PLATELET
Abs Immature Granulocytes: 0.01 K/uL (ref 0.00–0.07)
Basophils Absolute: 0 K/uL (ref 0.0–0.1)
Basophils Relative: 0 %
Eosinophils Absolute: 0 K/uL (ref 0.0–0.5)
Eosinophils Relative: 1 %
HCT: 41.7 % (ref 36.0–46.0)
Hemoglobin: 13.3 g/dL (ref 12.0–15.0)
Immature Granulocytes: 0 %
Lymphocytes Relative: 56 %
Lymphs Abs: 2.7 K/uL (ref 0.7–4.0)
MCH: 26.9 pg (ref 26.0–34.0)
MCHC: 31.9 g/dL (ref 30.0–36.0)
MCV: 84.2 fL (ref 80.0–100.0)
Monocytes Absolute: 0.4 K/uL (ref 0.1–1.0)
Monocytes Relative: 8 %
Neutro Abs: 1.7 K/uL (ref 1.7–7.7)
Neutrophils Relative %: 35 %
Platelets: 246 K/uL (ref 150–400)
RBC: 4.95 MIL/uL (ref 3.87–5.11)
RDW: 13.5 % (ref 11.5–15.5)
WBC: 4.9 K/uL (ref 4.0–10.5)
nRBC: 0 % (ref 0.0–0.2)

## 2024-06-04 LAB — COMPREHENSIVE METABOLIC PANEL WITH GFR
ALT: 17 U/L (ref 0–44)
AST: 21 U/L (ref 15–41)
Albumin: 4.3 g/dL (ref 3.5–5.0)
Alkaline Phosphatase: 62 U/L (ref 38–126)
Anion gap: 12 (ref 5–15)
BUN: 15 mg/dL (ref 8–23)
CO2: 25 mmol/L (ref 22–32)
Calcium: 9.9 mg/dL (ref 8.9–10.3)
Chloride: 103 mmol/L (ref 98–111)
Creatinine, Ser: 0.92 mg/dL (ref 0.44–1.00)
GFR, Estimated: >60 mL/min (ref >60)
Glucose, Bld: 86 mg/dL (ref 70–99)
Potassium: 3.6 mmol/L (ref 3.5–5.1)
Sodium: 140 mmol/L (ref 135–145)
Total Bilirubin: 0.9 mg/dL (ref 0.0–1.2)
Total Protein: 7.6 g/dL (ref 6.5–8.1)

## 2024-06-23 DIAGNOSIS — Z961 Presence of intraocular lens: Secondary | ICD-10-CM | POA: Diagnosis not present

## 2024-06-23 DIAGNOSIS — H2013 Chronic iridocyclitis, bilateral: Secondary | ICD-10-CM | POA: Diagnosis not present

## 2024-06-23 DIAGNOSIS — H401113 Primary open-angle glaucoma, right eye, severe stage: Secondary | ICD-10-CM | POA: Diagnosis not present

## 2024-06-23 DIAGNOSIS — H40022 Open angle with borderline findings, high risk, left eye: Secondary | ICD-10-CM | POA: Diagnosis not present

## 2024-06-26 DIAGNOSIS — M79675 Pain in left toe(s): Secondary | ICD-10-CM | POA: Diagnosis not present

## 2024-06-26 DIAGNOSIS — M79674 Pain in right toe(s): Secondary | ICD-10-CM | POA: Diagnosis not present

## 2024-06-26 DIAGNOSIS — I739 Peripheral vascular disease, unspecified: Secondary | ICD-10-CM | POA: Diagnosis not present

## 2024-06-26 DIAGNOSIS — M79672 Pain in left foot: Secondary | ICD-10-CM | POA: Diagnosis not present

## 2024-06-26 DIAGNOSIS — E1140 Type 2 diabetes mellitus with diabetic neuropathy, unspecified: Secondary | ICD-10-CM | POA: Diagnosis not present

## 2024-06-26 DIAGNOSIS — L110 Acquired keratosis follicularis: Secondary | ICD-10-CM | POA: Diagnosis not present

## 2024-06-26 DIAGNOSIS — M79671 Pain in right foot: Secondary | ICD-10-CM | POA: Diagnosis not present

## 2024-07-17 ENCOUNTER — Encounter (INDEPENDENT_AMBULATORY_CARE_PROVIDER_SITE_OTHER): Payer: Self-pay | Admitting: Gastroenterology

## 2024-10-09 ENCOUNTER — Other Ambulatory Visit (HOSPITAL_COMMUNITY): Payer: Self-pay | Admitting: Family Medicine

## 2024-10-09 DIAGNOSIS — F17200 Nicotine dependence, unspecified, uncomplicated: Secondary | ICD-10-CM

## 2024-10-31 ENCOUNTER — Ambulatory Visit (HOSPITAL_COMMUNITY)

## 2024-12-22 ENCOUNTER — Ambulatory Visit: Admitting: Internal Medicine
